# Patient Record
Sex: Female | Born: 1945
Health system: Southern US, Community
[De-identification: ages and names within clinical notes are randomized; demographics above are authoritative.]

## PROBLEM LIST (undated history)

## (undated) DIAGNOSIS — R51 Headache: Secondary | ICD-10-CM

## (undated) DIAGNOSIS — J45909 Unspecified asthma, uncomplicated: Secondary | ICD-10-CM

## (undated) DIAGNOSIS — Z889 Allergy status to unspecified drugs, medicaments and biological substances status: Secondary | ICD-10-CM

## (undated) DIAGNOSIS — R519 Headache, unspecified: Secondary | ICD-10-CM

## (undated) DIAGNOSIS — R569 Unspecified convulsions: Secondary | ICD-10-CM

## (undated) DIAGNOSIS — H9191 Unspecified hearing loss, right ear: Secondary | ICD-10-CM

## (undated) DIAGNOSIS — Z8673 Personal history of transient ischemic attack (TIA), and cerebral infarction without residual deficits: Secondary | ICD-10-CM

## (undated) DIAGNOSIS — G8254 Quadriplegia, C5-C7 incomplete: Secondary | ICD-10-CM

## (undated) DIAGNOSIS — J9621 Acute and chronic respiratory failure with hypoxia: Secondary | ICD-10-CM

## (undated) DIAGNOSIS — M419 Scoliosis, unspecified: Secondary | ICD-10-CM

## (undated) DIAGNOSIS — E039 Hypothyroidism, unspecified: Secondary | ICD-10-CM

## (undated) DIAGNOSIS — F32A Depression, unspecified: Secondary | ICD-10-CM

## (undated) DIAGNOSIS — I639 Cerebral infarction, unspecified: Secondary | ICD-10-CM

## (undated) DIAGNOSIS — F329 Major depressive disorder, single episode, unspecified: Secondary | ICD-10-CM

## (undated) HISTORY — PX: COLONOSCOPY: SHX174

## (undated) HISTORY — PX: OTHER SURGICAL HISTORY: SHX169

## (undated) HISTORY — PX: UPPER GI ENDOSCOPY: SHX6162

## (undated) HISTORY — PX: BACK SURGERY: SHX140

## (undated) HISTORY — PX: DILATION AND CURETTAGE OF UTERUS: SHX78

## (undated) SURGERY — Surgical Case
Anesthesia: *Unknown

---

## 1979-04-11 HISTORY — PX: TUBAL LIGATION: SHX77

## 2001-10-17 ENCOUNTER — Other Ambulatory Visit: Admission: RE | Admit: 2001-10-17 | Discharge: 2001-10-17 | Payer: Self-pay | Admitting: *Deleted

## 2002-05-29 ENCOUNTER — Encounter (INDEPENDENT_AMBULATORY_CARE_PROVIDER_SITE_OTHER): Payer: Self-pay | Admitting: Specialist

## 2002-05-29 ENCOUNTER — Ambulatory Visit (HOSPITAL_COMMUNITY): Admission: RE | Admit: 2002-05-29 | Discharge: 2002-05-29 | Payer: Self-pay | Admitting: *Deleted

## 2002-08-07 ENCOUNTER — Other Ambulatory Visit: Admission: RE | Admit: 2002-08-07 | Discharge: 2002-08-07 | Payer: Self-pay | Admitting: Obstetrics and Gynecology

## 2002-09-01 ENCOUNTER — Ambulatory Visit (HOSPITAL_COMMUNITY): Admission: RE | Admit: 2002-09-01 | Discharge: 2002-09-01 | Payer: Self-pay | Admitting: Obstetrics and Gynecology

## 2002-09-01 ENCOUNTER — Encounter: Payer: Self-pay | Admitting: Obstetrics and Gynecology

## 2003-08-14 ENCOUNTER — Other Ambulatory Visit: Admission: RE | Admit: 2003-08-14 | Discharge: 2003-08-14 | Payer: Self-pay | Admitting: Obstetrics and Gynecology

## 2003-09-25 ENCOUNTER — Ambulatory Visit (HOSPITAL_COMMUNITY): Admission: RE | Admit: 2003-09-25 | Discharge: 2003-09-25 | Payer: Self-pay | Admitting: Internal Medicine

## 2003-12-08 ENCOUNTER — Ambulatory Visit (HOSPITAL_COMMUNITY): Admission: RE | Admit: 2003-12-08 | Discharge: 2003-12-08 | Payer: Self-pay | Admitting: Internal Medicine

## 2004-02-22 ENCOUNTER — Ambulatory Visit: Payer: Self-pay | Admitting: Internal Medicine

## 2004-04-10 HISTORY — PX: OTHER SURGICAL HISTORY: SHX169

## 2004-06-24 ENCOUNTER — Ambulatory Visit: Payer: Self-pay | Admitting: Internal Medicine

## 2004-10-07 ENCOUNTER — Other Ambulatory Visit: Admission: RE | Admit: 2004-10-07 | Discharge: 2004-10-07 | Payer: Self-pay | Admitting: Obstetrics and Gynecology

## 2004-10-07 ENCOUNTER — Ambulatory Visit: Payer: Self-pay | Admitting: Internal Medicine

## 2004-10-19 ENCOUNTER — Encounter: Admission: RE | Admit: 2004-10-19 | Discharge: 2004-10-19 | Payer: Self-pay | Admitting: Internal Medicine

## 2004-10-21 ENCOUNTER — Encounter: Admission: RE | Admit: 2004-10-21 | Discharge: 2004-10-21 | Payer: Self-pay | Admitting: Internal Medicine

## 2005-01-11 ENCOUNTER — Encounter: Admission: RE | Admit: 2005-01-11 | Discharge: 2005-01-11 | Payer: Self-pay | Admitting: Internal Medicine

## 2005-01-23 ENCOUNTER — Encounter: Admission: RE | Admit: 2005-01-23 | Discharge: 2005-01-23 | Payer: Self-pay | Admitting: Orthopedic Surgery

## 2005-01-24 ENCOUNTER — Ambulatory Visit (HOSPITAL_COMMUNITY): Admission: RE | Admit: 2005-01-24 | Discharge: 2005-01-24 | Payer: Self-pay | Admitting: Orthopedic Surgery

## 2005-01-24 ENCOUNTER — Ambulatory Visit (HOSPITAL_BASED_OUTPATIENT_CLINIC_OR_DEPARTMENT_OTHER): Admission: RE | Admit: 2005-01-24 | Discharge: 2005-01-24 | Payer: Self-pay | Admitting: Orthopedic Surgery

## 2005-03-15 ENCOUNTER — Encounter: Admission: RE | Admit: 2005-03-15 | Discharge: 2005-03-15 | Payer: Self-pay | Admitting: Internal Medicine

## 2005-08-09 ENCOUNTER — Ambulatory Visit: Payer: Self-pay | Admitting: Internal Medicine

## 2005-09-29 ENCOUNTER — Ambulatory Visit: Payer: Self-pay | Admitting: Internal Medicine

## 2005-10-19 ENCOUNTER — Ambulatory Visit: Payer: Self-pay | Admitting: Internal Medicine

## 2006-03-06 ENCOUNTER — Ambulatory Visit: Payer: Self-pay | Admitting: Internal Medicine

## 2006-03-19 ENCOUNTER — Ambulatory Visit: Payer: Self-pay | Admitting: Internal Medicine

## 2006-05-10 ENCOUNTER — Ambulatory Visit: Payer: Self-pay | Admitting: Internal Medicine

## 2006-09-25 ENCOUNTER — Ambulatory Visit: Payer: Self-pay | Admitting: Internal Medicine

## 2007-02-28 DIAGNOSIS — J986 Disorders of diaphragm: Secondary | ICD-10-CM

## 2007-02-28 DIAGNOSIS — J449 Chronic obstructive pulmonary disease, unspecified: Secondary | ICD-10-CM

## 2007-02-28 DIAGNOSIS — E039 Hypothyroidism, unspecified: Secondary | ICD-10-CM | POA: Insufficient documentation

## 2007-02-28 DIAGNOSIS — M4120 Other idiopathic scoliosis, site unspecified: Secondary | ICD-10-CM | POA: Insufficient documentation

## 2007-02-28 DIAGNOSIS — K2990 Gastroduodenitis, unspecified, without bleeding: Secondary | ICD-10-CM

## 2007-02-28 DIAGNOSIS — J4489 Other specified chronic obstructive pulmonary disease: Secondary | ICD-10-CM | POA: Insufficient documentation

## 2007-02-28 DIAGNOSIS — J45909 Unspecified asthma, uncomplicated: Secondary | ICD-10-CM | POA: Insufficient documentation

## 2007-02-28 DIAGNOSIS — K2970 Gastritis, unspecified, without bleeding: Secondary | ICD-10-CM | POA: Insufficient documentation

## 2007-03-28 ENCOUNTER — Ambulatory Visit: Payer: Self-pay | Admitting: Internal Medicine

## 2007-10-30 ENCOUNTER — Ambulatory Visit: Payer: Self-pay | Admitting: Internal Medicine

## 2010-04-30 ENCOUNTER — Encounter: Payer: Self-pay | Admitting: Internal Medicine

## 2010-08-23 NOTE — Assessment & Plan Note (Signed)
Lindsay Mills                             PULMONARY OFFICE NOTE   NAME:Lindsay Mills                   MRN:          [id number]  DATE:09/25/2006                            DOB:          July 17, 1945    HISTORY:  This is a 65 year old white female former smoker, with a  diagnosis of COPD plus paralyzed left hemidiaphragm, (presumably due to  injury at time of back surgery for scolosis). On her last visit she  reported she was doing well with no need for Combivent. I emphasized  that the Combivent was a p.r.n. as was Mucinex DM. In the meantime for  some reason she got the idea she should stop Combivent and start  albuterol, and has been averaging about every other day use, especially  when she needs to go out to exercise. However, she says that when she  takes the albuterol she has excellent activity tolerance.   On physical examination, she is a somber but not overtly depressed white  female who becomes defensive and angry regarding medication issues (for  reasons that are not clear since we went over all this in writing in the  form of a medication calendar which she has failed to keep up with as  requested to assure quality of care (these were instructions at the  top of the sheet).  HEENT: Unremarkable, pharynx clear.  LUNG FIELDS: Reveal a few rhonchi only on expiration with diminished  breath sounds to the left base.  There is a regular rate and rhythm without murmur, rub, gallop.  No  increase in P2.  ABDOMEN: Soft, benign.  EXTREMITIES: Warm without calf tenderness, cyanosis, clubbing, or edema.   PFTs performed today indicate that she has lost ground with an FEV 1 and  FVC significantly lower than a year ago.   IMPRESSION:  Chronic obstructive pulmonary disease with an apparent  asthmatic component suggested by the frequent need for albuterol (albeit  not nocturnally) and the reduction in FEV 1 noted on today's evaluation  as compared  to a year ago. I recommended she stop Singulair and start  Symbicort 80/4.5 two puffs b.i.d. and produced her a new calendar today  with the above recommendations. When I showed her the new calendar she  made the comment that  I will go home and get this on my computer right  away without understanding really the intent of the calendar, that is  to stay on the same page, using the same organization for the next 3  months, and then to give [place] feedback as to whether the medications work  both maintenance and p.r.n., and to hold [place] each accountable for the  recommendations that are made in this office (note that she made several  significant errors in terms of understanding the instructions  previously). Indicating that we were not all reading from the same  page which is exactly the intension of creating the calendar in the  first place.   I am not sure I can continue to work with this patient based on the  anger she displayed today, but I apologized for  the system we use  where we have 1 set of instructions and she understands a different 1  when she goes home, and the medication calendar is the best bridge  that I know to provide full medication reconciliation in patients like  Ms. Hund.   Also I think there is some reluctance to change courses because she  feels that she does well as long she uses albuterol, which risks both  tachyphylaxis (from the S-isomer component of albuterol) as well as  opening the airway without providing adequate antiinflammatory control  of what is probably smoldering asthma.   I therefore recommended the above changes and asked her to call me if  not improved after 2 weeks of Symbicort with the goal of improving  activity tolerance, and minimizing the need for rescue therapy (I  switched her from Combivent to albuterol at this point both verbally and  by eliminating Combivent from the p.r.n. list). So again to make sure we  were still reading from the  same page in terms of full medication  understanding and reconciliation, and I have asked her to show this to  Dr. Cottonwood Callas as well as any physician she sees, and explained why this was  so important.   Overall I spent over 30 minutes with this patient today just on the  issue of medication reconciliation but not sure still that she got the  full message I intended, nor the purpose.     Lindsay Mills. Lindsay Sires, MD, [facility]  Electronically Signed    MBW/MedQ  DD: 09/25/2006  DT: 09/26/2006  Job #: [id number]   cc:   Lindsay Mills, M.D.

## 2010-08-26 NOTE — Assessment & Plan Note (Signed)
[facility]                             PULMONARY OFFICE NOTE   NAME:Speaker, Lindsay Mills                   MRN:          [id number]  DATE:03/06/2006                            DOB:          Feb 01, 1946    PULMONARY/EXTENDED FOLLOWUP OFFICE VISIT   Ms. Lindsay Mills was last seen on July 12 with COPD with no active asthmatic  component, with cough only after eating suggesting reflux to me.  I  recommended she treat reflux aggressively preoperatively and underwent  extensive back surgery both on September 5 and January 10, 2006, in  [place] and was found to have a paralyzed left hemidiaphragm  postoperatively by fluoroscopy of unclear origin (her baseline chest x-  ray shows so much curvature that it was not possible to say whether the  diaphragm was elevated at baseline).   In any case, she has done well postoperatively but is now maintained on  high doses of lisinopril and is having to use the Combivent four times  daily whereas preoperatively she was not using any Combivent at all.  She denies, however, any nocturnal awaking, excess sputum production,  fever, chills, sweats, chest pain, or leg swelling.   For her medications please see medication face sheet, dated March 06, 2006.   PHYSICAL EXAMINATION:  GENERAL:  She is an anxious but pleasant-  appearing, slow-moving white female in no acute distress, still wearing  extensive back brace.  VITAL SIGNS:  She is afebrile with stable vital signs with a weight of  128 pounds which is down 8 pounds from previous visit.  HEENT:  Unremarkable, pharynx clear.  LUNG FIELDS:  Reveal diminished breath sound but there is no wheezing at  all within 4 hours of her last Combivent treatment.  HEART:  Has a regular rhythm without murmur, gallop, rub.  ABDOMEN:  Soft, benign.  EXTREMITIES:  Warm without calf tenderness, cyanosis, clubbing, edema.   PFTs were reviewed from June 22 and indicated an FEV1 of only 43%  predicted with no improvement after bronchodilators.   IMPRESSION:  Chronic obstructive pulmonary disease with no active  asthmatic component by previous study, now dependent on Combivent but  note the absence of significant sputum production or nocturnal awakening  again suggests the upper airway as the possible culprit here, especially  in a patient who has undergone both intubation twice and now is  maintained on ACE inhibitors.  I therefore recommended she stop the ACE  inhibitors in favor of Benicar at 20 mg one daily with the option of  taking a second pill if her diastolics are above 90 or systolics above  140.   I reviewed with her the optimal use for Combivent, which is a p.r.n.  q.4h. and not a q.i.d. drug, and also to try substituting ibuprofen  (which she tolerated in the past) for Dilaudid.   I spent actually 15-20 minutes of a 25-minute visit with this patient  trying to reiterate these instructions in writing.  She is having a  difficult time grasping the concept of a maintenance versus p.r.n. and  so I would like her to  return in 2 weeks both for a blood pressure  recheck and also medication reconciliation and generation of a  medication calendar.     Lindsay Mills. Sherene Sires, MD, [facility]  Electronically Signed    MBW/MedQ  DD: 03/06/2006  DT: 03/06/2006  Job #: [id number]   cc:   Georgianne Fick, M.D.

## 2010-08-26 NOTE — Op Note (Signed)
NAMEJAHZARIA, STITZER                      ACCOUNT NO.:  [id number]   MEDICAL RECORD NO.:  [id number]                   PATIENT TYPE:  AMB   LOCATION:  ENDO                                 FACILITY:  [facility]   PHYSICIAN:  Georgiana Spinner, M.D.                 DATE OF BIRTH:  06/29/1945   DATE OF PROCEDURE:  05/29/2002  DATE OF DISCHARGE:                                 OPERATIVE REPORT   PROCEDURE:  Colonoscopy.   INDICATIONS FOR PROCEDURE:  Hemoccult positivity.   ANESTHESIA:  Demerol 40, Versed 2 mg.   DESCRIPTION OF PROCEDURE:  With the patient mildly sedated in the left  lateral decubitus position, the Olympus videoscopic colonoscope was inserted  in the rectum and passed under direct vision to the cecum, identified by the  ileocecal valve and the crow's foot of the cecum. We entered into the  terminal ileum which also appeared normal and was photographed.  From this  point, the colonoscope was slowly withdrawn taking circumferential views of  the entire colonic mucosa, stopping only in the rectum which appeared normal  on direct and showed large hemorrhoid on retroflexed view. The endoscope was  straightened and withdrawn. The patient's vital signs and pulse oximetry  remained stable. The patient tolerated the procedure well without apparent  complications.   FINDINGS:  Internal hemorrhoid, otherwise unremarkable examination.   PLAN:  See endoscopy note for further details.                                               Georgiana Spinner, M.D.    GMO/MEDQ  D:  05/29/2002  T:  05/29/2002  Job:  [id number]

## 2010-08-26 NOTE — Op Note (Signed)
NAMEYUDITH, COFFELL            ACCOUNT NO.:  [id number]   MEDICAL RECORD NO.:  [id number]          PATIENT TYPE:  AMB   LOCATION:  DSC                          FACILITY:  [facility]   PHYSICIAN:  Leonides Grills, M.D.     DATE OF BIRTH:  21-Mar-1946   DATE OF PROCEDURE:  01/24/2005  DATE OF DISCHARGE:                                 OPERATIVE REPORT   PREOPERATIVE DIAGNOSES:  1.  Left hallux valgus.  2.  Left bunionette.   POSTOPERATIVE DIAGNOSES:  1.  Left hallux valgus.  2.  Left bunionette.   OPERATIONS:  1.  Left chevron bunionectomy.  2.  Left partial excision of fifth metatarsal head.  3.  Stress x-rays, left foot.   ANESTHESIA:  General.   SURGEON:  Leonides Grills, M.D.   ASSISTANT:  Lianne Cure, P.A.   ESTIMATED BLOOD LOSS:  Minimal.   TOURNIQUET TIME:  Approximately 40 minutes.   COMPLICATIONS:  None.   DISPOSITION:  Stable to the PAR.   INDICATIONS:  This is a 65 year old female with longstanding forefoot pain  that was interfering with her life to the point that she could not do what  she wanted to do.  She was consented for the above procedure.  All risks,  which include infection, nerve or vessel injury, nonunion, malunion,  hardware irritation, hardware failure, hallux varus, recurrence of  deformity, persistent pain, worsening of pain, prolonged recovery,  stiffness, arthritis, were all explained, and questions were encouraged and  answered.   OPERATION:  The patient was brought to the operating room and placed in the  supine position after adequate general endotracheal tube anesthesia  administered as well as Ancef 1 g IV piggyback.  The left lower extremity  was then prepped and draped in the usual sterile manner over a proximally-  placed thigh tourniquet.  The limb was gravity-exsanguinated the tourniquet  elevated to 290 mmHg.  A dorsal lateral incision was then made over the left  fifth metatarsal head.  The incision was carried down through  skin,  hemostasis obtained.  The capsule was opened in line with the incision.  The  soft tissue was then elevated over the lateral aspect of the head.  A  sagittal saw was then used to create an osteotomy and resection of the  lateral aspect of the fifth metatarsal head.  The edges were then rounded  off with a rongeur.  This was palpated through skin to be adequately  decompressed.  The area was copiously irrigated with normal saline and we  then made a longitudinal incision over the medial aspect of the great toe  MTP joint.  Dissection was carried down through skin and hemostasis was  obtained.  The neurovascular structures were identified both superiorly and  inferiorly and protected throughout the case.  Lateral capsulotomy was then  made.  A simple bunionectomy was then performed with a sagittal saw.  The  lateral capsule was then released with the curved Beaver blade.  Rocky Link  Johnson's ridge was then rounded off with a rongeur.  Soft tissue was  elevated both superiorly and inferiorly and  protected throughout the case.  The center of the head was then identified and a chevron osteotomy was then  created with a sagittal saw.  The head was then translated approximately 3-4  mm laterally and fixed with a 2.0 mm fully-threaded cortical set screw using  a 1.5 mm drill hole, respectively.  This was countersunk.  Trimmed off some  redundant bone medially with the sagittal saw.  The area and joint were  copiously irrigated with normal saline.  The capsule was advanced both  superiorly and proximally and reconstructed with a 2-0 Vicryl suture.  Stress x-rays were obtained in the AP and lateral planes and showed no gross  motion across the osteotomy site.  Fixation was in the proper position and  the correction was excellent as well.  The range of motion of the toe was  excellent and the sesamoids were located.  Once again, the tourniquet was  deflated, hemostasis was obtained.  The skin was  closed with 4-0 nylon  suture over all wounds.  A sterile dressing was applied, a Programmer, multimedia  dressing was applied, a hard-sole shoe was applied.  The patient was stable  to the PAR.      Leonides Grills, M.D.  Electronically Signed     PB/MEDQ  D:  01/24/2005  T:  01/24/2005  Job:  [id number]

## 2010-08-26 NOTE — Assessment & Plan Note (Signed)
[facility]                               PULMONARY OFFICE NOTE   NAME:Elgersma, CHAKAYLA SASNETT                   MRN:          [id number]  DATE:10/19/2005                            DOB:          October 14, 1945    HISTORY:  A 65 year old white female remote smoker with evidence of severe  COPD by PFTs as recently as September 29, 2005 in for preoperative evaluation  to consider spine surgery.  She states she is having worsening troubles over  the last several months, especially immediately after eating or if she tries  to exercise.  She has no real variability with environmental, weather, or  season changes.  Denies any associated subjective wheezing, fevers, chills,  sweats, orthopnea, PND, sinus, or reflux symptoms, or significant coughing.  She is considering undergoing extensive spinal surgery in September in  [place] to correct this.   Presently, she is maintained on Singulair 10 mg daily, Nexium 40 mg q.a.m.,  Flonase b.i.d., but rarely uses albuterol.  The only time she uses albuterol  is right after she eats.  She denies ever needing it at night or waking up  with any nocturnal wheeze or cough.  She says she is not consistent about  using the Nexium every day, but pretty much remembers it most of the time.  Denies any overt reflux symptoms.   PHYSICAL EXAMINATION:  GENERAL:  She is a pleasant, ambulatory white female  in no acute distress.  VITAL SIGNS:  She has normal vital signs.  HEENT:  Unremarkable.  Pharynx clear.  Frequent throat clearing, but no  evidence of excessive postnasal drainage or cobblestoning.  LUNGS:  Lung fields reveal diminished breath sounds bilaterally.  No  wheezing.  HEART:  There is regular rhythm without murmurs, rubs, or gallops.  ABDOMEN:  Obese, but otherwise benign.  EXTREMITIES:  Warm without calf tenderness, clubbing, cyanosis, edema.   PFTs indicate an FEV1 of 1 L which is down from 1.33 in June of 2006.  Looking  back in June of 2006 it does not appear that she was on any  different medicines that she is on now with the exception of daily Nexium  versus b.i.d. dosing.  This plus the fact that her symptoms are much worse  after she eats suggest to me a possible mechanism of reflux and also may be  developing significant deconditioning.   IMPRESSION:  Chronic obstructive pulmonary disease with no evidence of an  active asthmatic component.  Note, she does not wake up nocturnally.  Denies  any excessive coughing and most of her symptoms occur after eating.  This  would indicate to me that she is experiencing problems related to  gastroesophageal reflux disease and/or the effect of reduced abdominal  compliance which is in turn related to worsening thoracic type of scoliosis.   RECOMMENDATIONS:  Increase PPI therapy to b.i.d. with the option of adding  back empiric bronchodilators prior to surgery if not improving prior to that  time.  In the meantime I have given her an exercise regimen and emphasized  to her that if she is going  to have major surgery in [place] she probably  needs to check in with her [facility] pulmonologist well before the actual  operation to see if more short-term more aggressive bronchodilator regimen  might be recommended.                                   [name]. Sherene Sires, MD, [facility]   MBW/MedQ  DD:  10/20/2005  DT:  10/20/2005  Job #:  [id number]   cc:   Lucianne Muss, M.D.

## 2010-08-26 NOTE — Assessment & Plan Note (Signed)
[facility]                             PULMONARY OFFICE NOTE   NAME:Lindsay Mills, Lindsay Mills                   MRN:          [id number]  DATE:05/10/2006                            DOB:          1946/03/19    HISTORY:  A delightful 65 year old white female with severe COPD and a  paralyzed left hemidiaphragm, with an FEV1 of a liter documented in June  2007, who actually has no significant limitations presently and rarely  needs Combivent any more.  She also sleeps well at night with no  significant cough, fevers, chills, sweats, morning congestion or excess  sputum production.  She denies any chest pain, orthopnea, PND or leg  swelling.   She is now using the medication calendar that our nurse practitioner  generated for her and has been no longer needing any form of p.r.n.,  including Combivent and Dilaudid.   PHYSICAL EXAMINATION:  GENERAL:  She is a pleasant, ambulatory white  female in no acute distress.  VITAL SIGNS:  Stable vital signs with a blood pressure of 118/70 after  taking Benicar 40 mg this morning.  HEENT:  Unremarkable.  NECK:  Clear.  LUNGS:  Lung fields revealed diminished breath sounds bilaterally but no  wheezing.  There is dullness at the left base.  CARDIAC:  There is a regular rate and rhythm without murmur, gallop, or  rub.  ABDOMEN:  Soft, benign.  EXTREMITIES:  Warm without calf tenderness, cyanosis or clubbing.   IMPRESSION:  Severe chronic obstructive pulmonary disease plus paralyzed  left hemidiaphragm, relatively well-compensated to the point where she  does not require Combivent any more.  She also no longer has any  significant evidence of bronchitis, and therefore I am going to  recommend Mucinex DM be switched to p.r.n.   The paralyzed hemidiaphragm was discovered in [place], so we have no data  on this but the workup was completed there and is also not really a  relevant issue since she is not presently limited  by dyspnea or  orthopnea.   Therefore, pulmonary follow-up should be with a full set of PFTs in June  and then yearly thereafter with all refills through  her primary  if ok  with Dr. Nicholos Johns.     [name]. Sherene Sires, MD, [facility]  Electronically Signed    MBW/MedQ  DD: 05/10/2006  DT: 05/10/2006  Job #: [id number]   cc:   Georgianne Fick, M.D.

## 2010-08-26 NOTE — Op Note (Signed)
NAMESADIELYNN, ONION                      ACCOUNT NO.:  [id number]   MEDICAL RECORD NO.:  [id number]                   PATIENT TYPE:  AMB   LOCATION:  ENDO                                 FACILITY:  [facility]   PHYSICIAN:  Georgiana Spinner, M.D.                 DATE OF BIRTH:  1945/10/25   DATE OF PROCEDURE:  05/29/2002  DATE OF DISCHARGE:                                 OPERATIVE REPORT   PROCEDURE:  Upper endoscopy.   INDICATIONS:  Hemoccult positivity.   ANESTHESIA:  Demerol 40, Versed 6 mg.   PROCEDURE IN DETAIL:  With patient mildly sedated in the left lateral  decubitus position, the Olympus video endoscope was inserted in the mouth,  passed under direct vision through the esophagus, and there was a question  of Barrett's esophagus seen and photographed and biopsied.  We entered into  the stomach.  The fundus, body and antrum were visualized; and where the  fundus meets the body, there were two linear ulcerations seen, photographed  and biopsied.  The duodenal bulb was visualized, as was the second portion  of the duodenum.  From this point, the endoscope was slowly withdrawn,  taking circumferential views of the duodenal mucosa until the endoscope was  pulled back into the stomach.  It was placed in retroflexion to view the  stomach from below.  The endoscope was then straightened and withdrawn,  taking circumferential views of the entire gastric and esophageal mucosa.  The patient's vital signs and pulse oximeter remained stable.  The patient  tolerated the procedure well without apparent complications.   FINDINGS:  Question of Barrett's esophagus above the hiatal hernia  photographed and biopsied.  Two linear ulcers biopsied.  Await biopsy  report.  The patient will call me for results and follow up with me as an  outpatient.                                               Georgiana Spinner, M.D.    GMO/MEDQ  D:  05/29/2002  T:  05/29/2002  Job:  [id number]

## 2011-02-27 ENCOUNTER — Other Ambulatory Visit: Payer: Self-pay | Admitting: Obstetrics and Gynecology

## 2012-04-10 HISTORY — PX: OTHER SURGICAL HISTORY: SHX169

## 2012-07-08 ENCOUNTER — Other Ambulatory Visit: Payer: Self-pay | Admitting: Internal Medicine

## 2012-07-08 DIAGNOSIS — R55 Syncope and collapse: Secondary | ICD-10-CM

## 2012-07-11 ENCOUNTER — Ambulatory Visit
Admission: RE | Admit: 2012-07-11 | Discharge: 2012-07-11 | Disposition: A | Discharge status: Home / Self Care | Payer: Medicare Other | Source: Ambulatory Visit | Attending: Internal Medicine | Admitting: Internal Medicine

## 2012-07-11 DIAGNOSIS — R55 Syncope and collapse: Secondary | ICD-10-CM

## 2013-08-08 DIAGNOSIS — I639 Cerebral infarction, unspecified: Secondary | ICD-10-CM

## 2013-08-08 HISTORY — DX: Cerebral infarction, unspecified: I63.9

## 2014-05-29 ENCOUNTER — Other Ambulatory Visit: Payer: Self-pay | Admitting: Obstetrics and Gynecology

## 2014-08-07 ENCOUNTER — Ambulatory Visit (HOSPITAL_BASED_OUTPATIENT_CLINIC_OR_DEPARTMENT_OTHER)
Admission: RE | Admit: 2014-08-07 | Payer: Medicare Other | Source: Ambulatory Visit | Admitting: Obstetrics and Gynecology

## 2014-08-07 ENCOUNTER — Encounter (HOSPITAL_BASED_OUTPATIENT_CLINIC_OR_DEPARTMENT_OTHER): Admission: RE | Payer: Self-pay | Source: Ambulatory Visit

## 2014-08-07 SURGERY — DILATATION AND CURETTAGE /HYSTEROSCOPY
Anesthesia: Choice

## 2014-10-07 ENCOUNTER — Other Ambulatory Visit: Payer: Self-pay | Admitting: Obstetrics and Gynecology

## 2014-10-13 ENCOUNTER — Encounter (HOSPITAL_COMMUNITY): Payer: Self-pay

## 2014-10-13 ENCOUNTER — Other Ambulatory Visit: Payer: Self-pay

## 2014-10-13 ENCOUNTER — Encounter (HOSPITAL_COMMUNITY)
Admission: RE | Admit: 2014-10-13 | Discharge: 2014-10-13 | Disposition: A | Discharge status: Home / Self Care | Payer: Medicare Other | Source: Ambulatory Visit | Attending: Obstetrics and Gynecology | Admitting: Obstetrics and Gynecology

## 2014-10-13 DIAGNOSIS — Z01818 Encounter for other preprocedural examination: Secondary | ICD-10-CM | POA: Insufficient documentation

## 2014-10-13 DIAGNOSIS — N938 Other specified abnormal uterine and vaginal bleeding: Secondary | ICD-10-CM | POA: Diagnosis not present

## 2014-10-13 HISTORY — DX: Major depressive disorder, single episode, unspecified: F32.9

## 2014-10-13 HISTORY — DX: Allergy status to unspecified drugs, medicaments and biological substances: Z88.9

## 2014-10-13 HISTORY — DX: Unspecified asthma, uncomplicated: J45.909

## 2014-10-13 HISTORY — DX: Cerebral infarction, unspecified: I63.9

## 2014-10-13 HISTORY — DX: Unspecified hearing loss, right ear: H91.91

## 2014-10-13 HISTORY — DX: Headache, unspecified: R51.9

## 2014-10-13 HISTORY — DX: Hypothyroidism, unspecified: E03.9

## 2014-10-13 HISTORY — DX: Depression, unspecified: F32.A

## 2014-10-13 HISTORY — DX: Unspecified convulsions: R56.9

## 2014-10-13 HISTORY — DX: Scoliosis, unspecified: M41.9

## 2014-10-13 HISTORY — DX: Headache: R51

## 2014-10-13 LAB — BASIC METABOLIC PANEL
Anion gap: 4 — ABNORMAL LOW (ref 5–15)
BUN: 17 mg/dL (ref 6–20)
CALCIUM: 9.5 mg/dL (ref 8.9–10.3)
CO2: 33 mmol/L — ABNORMAL HIGH (ref 22–32)
Chloride: 102 mmol/L (ref 101–111)
Creatinine, Ser: 1.05 mg/dL — ABNORMAL HIGH (ref 0.44–1.00)
GFR, EST NON AFRICAN AMERICAN: 53 mL/min — AB (ref >60)
Glucose, Bld: 95 mg/dL (ref 65–99)
Potassium: 3.1 mmol/L — ABNORMAL LOW (ref 3.5–5.1)
Sodium: 139 mmol/L (ref 135–145)

## 2014-10-13 LAB — CBC
HCT: 41.7 % (ref 36.0–46.0)
Hemoglobin: 13.7 g/dL (ref 12.0–15.0)
MCH: 31.8 pg (ref 26.0–34.0)
MCHC: 32.9 g/dL (ref 30.0–36.0)
MCV: 96.8 fL (ref 78.0–100.0)
PLATELETS: 111 10*3/uL — AB (ref 150–400)
RBC: 4.31 MIL/uL (ref 3.87–5.11)
RDW: 14.6 % (ref 11.5–15.5)
WBC: 5.1 10*3/uL (ref 4.0–10.5)

## 2014-10-13 NOTE — Pre-Procedure Instructions (Signed)
Patient instructed to eat bananas now until DOS.

## 2014-10-13 NOTE — Patient Instructions (Addendum)
   Your procedure is scheduled on:  Friday, July 15  Enter through the [employer] [facility] at: 10:45 AM Pick up the phone at the desk and dial [phone] and inform us of your arrival.  Please call this number if you have any problems the morning of surgery: [phone]  Remember: Do not eat food after midnight: Thursday Do not drink clear liquids after: 8 AM Friday, day of surgery Take these medicines the morning of surgery with a SIP OF WATER: use symbicort inhaler, primidone, singulair, synthroid, zoloft, flonase if needed.  Bring albuterol inhaler with you on day of surgery.  Do not wear jewelry, make-up, or FINGER nail polish No metal in your hair or on your body. Do not wear lotions, powders, perfumes.  You may wear deodorant.  Do not bring valuables to the hospital. Contacts, dentures or bridgework may not be worn into surgery.   Patients discharged on the day of surgery will not be allowed to drive home.  Home with husband Ron cell [phone]

## 2014-10-13 NOTE — Pre-Procedure Instructions (Signed)
Dr Tresa Moore informed of patient's medical history, medications and low platelet count 111 at PAT appt.  Repeat platelet count on DOS. Verified T/O.

## 2014-10-22 ENCOUNTER — Encounter (HOSPITAL_COMMUNITY): Payer: Self-pay | Admitting: Anesthesiology

## 2014-10-22 NOTE — Anesthesia Preprocedure Evaluation (Addendum)
Anesthesia Evaluation  Patient identified by MRN, date of birth, ID band Patient awake    Reviewed: Allergy & Precautions, H&P , NPO status , Patient's Chart, lab work & pertinent test results, reviewed documented beta blocker date and time , Unable to perform ROS - Chart review only  History of Anesthesia Complications (+) PONV and history of anesthetic complications  Airway Mallampati: II  TM Distance: >3 FB Neck ROM: full    Dental no notable dental hx.    Pulmonary asthma , COPD COPD inhaler, former smoker,  breath sounds clear to auscultation  Pulmonary exam normal       Cardiovascular negative cardio ROS Normal cardiovascular examRhythm:regular Rate:Normal     Neuro/Psych  Headaches, Seizures -, Well Controlled,  PSYCHIATRIC DISORDERS CVA, Residual Symptoms    GI/Hepatic negative GI ROS, Neg liver ROS,   Endo/Other  Hypothyroidism   Renal/GU negative Renal ROS     Musculoskeletal Significant scoliosis   Abdominal   Peds  Hematology negative hematology ROS (+)   Anesthesia Other Findings NPO appropriate, allergies reviewed Denies active cardiac or pulmonary symptoms, METS > 4 No recent congestive cough or symptoms of upper respiratory infection   Reproductive/Obstetrics negative OB ROS                          Anesthesia Physical Anesthesia Plan  ASA: III  Anesthesia Plan: General   Post-op Pain Management:    Induction: Intravenous  Airway Management Planned: LMA  Additional Equipment:   Intra-op Plan:   Post-operative Plan: Extubation in OR  Informed Consent: I have reviewed the patients History and Physical, chart, labs and discussed the procedure including the risks, benefits and alternatives for the proposed anesthesia with the patient or authorized representative who has indicated his/her understanding and acceptance.   Dental Advisory Given  Plan Discussed with:  Anesthesiologist  Anesthesia Plan Comments:        Anesthesia Quick Evaluation

## 2014-10-23 ENCOUNTER — Observation Stay (HOSPITAL_COMMUNITY)
Admission: RE | Admit: 2014-10-23 | Discharge: 2014-10-24 | Disposition: A | Discharge status: Home / Self Care | Payer: Medicare Other | Source: Ambulatory Visit | Attending: Obstetrics and Gynecology | Admitting: Obstetrics and Gynecology

## 2014-10-23 ENCOUNTER — Encounter (HOSPITAL_COMMUNITY)
Admission: RE | Disposition: A | Discharge status: Home / Self Care | Payer: Self-pay | Source: Ambulatory Visit | Attending: Obstetrics and Gynecology

## 2014-10-23 ENCOUNTER — Ambulatory Visit (HOSPITAL_COMMUNITY): Payer: Medicare Other | Admitting: Anesthesiology

## 2014-10-23 DIAGNOSIS — N858 Other specified noninflammatory disorders of uterus: Secondary | ICD-10-CM | POA: Diagnosis present

## 2014-10-23 DIAGNOSIS — F329 Major depressive disorder, single episode, unspecified: Secondary | ICD-10-CM | POA: Diagnosis not present

## 2014-10-23 DIAGNOSIS — Z885 Allergy status to narcotic agent status: Secondary | ICD-10-CM | POA: Diagnosis not present

## 2014-10-23 DIAGNOSIS — J45909 Unspecified asthma, uncomplicated: Secondary | ICD-10-CM | POA: Diagnosis not present

## 2014-10-23 DIAGNOSIS — I480 Paroxysmal atrial fibrillation: Secondary | ICD-10-CM

## 2014-10-23 DIAGNOSIS — N950 Postmenopausal bleeding: Secondary | ICD-10-CM | POA: Insufficient documentation

## 2014-10-23 DIAGNOSIS — Z8673 Personal history of transient ischemic attack (TIA), and cerebral infarction without residual deficits: Secondary | ICD-10-CM | POA: Diagnosis not present

## 2014-10-23 DIAGNOSIS — Z87891 Personal history of nicotine dependence: Secondary | ICD-10-CM | POA: Diagnosis not present

## 2014-10-23 DIAGNOSIS — M419 Scoliosis, unspecified: Secondary | ICD-10-CM | POA: Insufficient documentation

## 2014-10-23 DIAGNOSIS — I4891 Unspecified atrial fibrillation: Secondary | ICD-10-CM | POA: Insufficient documentation

## 2014-10-23 DIAGNOSIS — G40909 Epilepsy, unspecified, not intractable, without status epilepticus: Secondary | ICD-10-CM | POA: Diagnosis not present

## 2014-10-23 DIAGNOSIS — E039 Hypothyroidism, unspecified: Secondary | ICD-10-CM | POA: Insufficient documentation

## 2014-10-23 DIAGNOSIS — R51 Headache: Secondary | ICD-10-CM | POA: Insufficient documentation

## 2014-10-23 DIAGNOSIS — I482 Chronic atrial fibrillation, unspecified: Secondary | ICD-10-CM | POA: Diagnosis present

## 2014-10-23 HISTORY — PX: HYSTEROSCOPY W/D&C: SHX1775

## 2014-10-23 LAB — COMPREHENSIVE METABOLIC PANEL
ALBUMIN: 3.5 g/dL (ref 3.5–5.0)
ALK PHOS: 60 U/L (ref 38–126)
ALT: 13 U/L — AB (ref 14–54)
ANION GAP: 5 (ref 5–15)
AST: 30 U/L (ref 15–41)
BUN: 15 mg/dL (ref 6–20)
CO2: 33 mmol/L — ABNORMAL HIGH (ref 22–32)
Calcium: 8.9 mg/dL (ref 8.9–10.3)
Chloride: 103 mmol/L (ref 101–111)
Creatinine, Ser: 0.82 mg/dL (ref 0.44–1.00)
GFR calc Af Amer: >60 mL/min (ref >60)
GFR calc non Af Amer: >60 mL/min (ref >60)
Glucose, Bld: 89 mg/dL (ref 65–99)
POTASSIUM: 3.8 mmol/L (ref 3.5–5.1)
Sodium: 141 mmol/L (ref 135–145)
Total Bilirubin: 0.5 mg/dL (ref 0.3–1.2)
Total Protein: 6.4 g/dL — ABNORMAL LOW (ref 6.5–8.1)

## 2014-10-23 LAB — CBC
HEMATOCRIT: 38.1 % (ref 36.0–46.0)
HEMOGLOBIN: 12.9 g/dL (ref 12.0–15.0)
MCH: 32.4 pg (ref 26.0–34.0)
MCHC: 33.9 g/dL (ref 30.0–36.0)
MCV: 95.7 fL (ref 78.0–100.0)
Platelets: 106 10*3/uL — ABNORMAL LOW (ref 150–400)
RBC: 3.98 MIL/uL (ref 3.87–5.11)
RDW: 14.8 % (ref 11.5–15.5)
WBC: 5.8 10*3/uL (ref 4.0–10.5)

## 2014-10-23 LAB — TROPONIN I

## 2014-10-23 LAB — PLATELET COUNT: Platelets: 114 10*3/uL — ABNORMAL LOW (ref 150–400)

## 2014-10-23 LAB — TSH: TSH: 0.372 u[IU]/mL (ref 0.350–4.500)

## 2014-10-23 LAB — T4, FREE: FREE T4: 0.86 ng/dL (ref 0.61–1.12)

## 2014-10-23 SURGERY — DILATATION AND CURETTAGE /HYSTEROSCOPY
Anesthesia: General

## 2014-10-23 MED ORDER — ONDANSETRON HCL 4 MG/2ML IJ SOLN
INTRAMUSCULAR | Status: AC
  Filled 2014-10-23: qty 2

## 2014-10-23 MED ORDER — ACETAMINOPHEN 325 MG PO TABS: 325.0000 mg | ORAL_TABLET | ORAL | Status: DC | PRN

## 2014-10-23 MED ORDER — PRIMIDONE 250 MG PO TABS
250.0000 mg | ORAL_TABLET | Freq: Two times a day (BID) | ORAL | Status: DC
  Administered 2014-10-23 – 2014-10-24 (×2): 250 mg via ORAL
  Filled 2014-10-23 (×2): qty 1

## 2014-10-23 MED ORDER — PRENATAL MULTIVITAMIN CH: 1.0000 | ORAL_TABLET | Freq: Every day | ORAL | Status: DC

## 2014-10-23 MED ORDER — LIDOCAINE HCL (CARDIAC) 20 MG/ML IV SOLN
INTRAVENOUS | Status: AC
  Filled 2014-10-23: qty 5

## 2014-10-23 MED ORDER — PROPOFOL 10 MG/ML IV BOLUS
INTRAVENOUS | Status: AC
  Filled 2014-10-23: qty 20

## 2014-10-23 MED ORDER — ACETAMINOPHEN 160 MG/5ML PO SOLN: 325.0000 mg | ORAL | Status: DC | PRN

## 2014-10-23 MED ORDER — LIDOCAINE HCL 1 % IJ SOLN
INTRAMUSCULAR | Status: AC
  Filled 2014-10-23: qty 20

## 2014-10-23 MED ORDER — MIDAZOLAM HCL 2 MG/2ML IJ SOLN
INTRAMUSCULAR | Status: DC | PRN
  Administered 2014-10-23: 0.5 mg via INTRAVENOUS

## 2014-10-23 MED ORDER — SERTRALINE HCL 100 MG PO TABS
100.0000 mg | ORAL_TABLET | Freq: Every day | ORAL | Status: DC
  Filled 2014-10-23: qty 1

## 2014-10-23 MED ORDER — MONTELUKAST SODIUM 10 MG PO TABS
10.0000 mg | ORAL_TABLET | Freq: Every morning | ORAL | Status: DC
  Administered 2014-10-24: 10 mg via ORAL
  Filled 2014-10-23: qty 1

## 2014-10-23 MED ORDER — ATORVASTATIN CALCIUM 40 MG PO TABS
40.0000 mg | ORAL_TABLET | Freq: Every day | ORAL | Status: DC
  Administered 2014-10-23: 40 mg via ORAL
  Filled 2014-10-23: qty 1

## 2014-10-23 MED ORDER — LEVOTHYROXINE SODIUM 100 MCG PO TABS
100.0000 ug | ORAL_TABLET | Freq: Every day | ORAL | Status: DC
  Administered 2014-10-24: 100 ug via ORAL
  Filled 2014-10-23: qty 1

## 2014-10-23 MED ORDER — FENTANYL CITRATE (PF) 250 MCG/5ML IJ SOLN
INTRAMUSCULAR | Status: DC | PRN
  Administered 2014-10-23: 25 ug via INTRAVENOUS

## 2014-10-23 MED ORDER — LIDOCAINE HCL (CARDIAC) 20 MG/ML IV SOLN
INTRAVENOUS | Status: DC | PRN
  Administered 2014-10-23: 50 mg via INTRAVENOUS

## 2014-10-23 MED ORDER — LIDOCAINE HCL 1 % IJ SOLN
INTRAMUSCULAR | Status: DC | PRN
  Administered 2014-10-23: 10 mL

## 2014-10-23 MED ORDER — PHENYLEPHRINE HCL 10 MG/ML IJ SOLN
INTRAMUSCULAR | Status: DC | PRN
  Administered 2014-10-23: 40 ug via INTRAVENOUS
  Administered 2014-10-23: 80 ug via INTRAVENOUS
  Administered 2014-10-23 (×4): 40 ug via INTRAVENOUS

## 2014-10-23 MED ORDER — DEXAMETHASONE SODIUM PHOSPHATE 4 MG/ML IJ SOLN: INTRAMUSCULAR | Status: DC | PRN

## 2014-10-23 MED ORDER — PHENYLEPHRINE 40 MCG/ML (10ML) SYRINGE FOR IV PUSH (FOR BLOOD PRESSURE SUPPORT)
PREFILLED_SYRINGE | INTRAVENOUS | Status: AC
  Filled 2014-10-23: qty 10

## 2014-10-23 MED ORDER — FENTANYL CITRATE (PF) 100 MCG/2ML IJ SOLN
INTRAMUSCULAR | Status: AC
  Filled 2014-10-23: qty 2

## 2014-10-23 MED ORDER — PROPOFOL 10 MG/ML IV BOLUS
INTRAVENOUS | Status: DC | PRN
  Administered 2014-10-23: 100 mg via INTRAVENOUS

## 2014-10-23 MED ORDER — SERTRALINE HCL 100 MG PO TABS
100.0000 mg | ORAL_TABLET | Freq: Every day | ORAL | Status: DC
  Administered 2014-10-24: 100 mg via ORAL
  Filled 2014-10-23: qty 1

## 2014-10-23 MED ORDER — IBUPROFEN 600 MG PO TABS
600.0000 mg | ORAL_TABLET | Freq: Four times a day (QID) | ORAL | Status: DC | PRN
  Administered 2014-10-23: 600 mg via ORAL
  Filled 2014-10-23: qty 1

## 2014-10-23 MED ORDER — MIDAZOLAM HCL 2 MG/2ML IJ SOLN
INTRAMUSCULAR | Status: AC
  Filled 2014-10-23: qty 2

## 2014-10-23 MED ORDER — ONDANSETRON HCL 4 MG/2ML IJ SOLN
INTRAMUSCULAR | Status: DC | PRN
  Administered 2014-10-23: 4 mg via INTRAVENOUS

## 2014-10-23 MED ORDER — DARIFENACIN HYDROBROMIDE ER 7.5 MG PO TB24
7.5000 mg | ORAL_TABLET | Freq: Every day | ORAL | Status: DC
  Administered 2014-10-23 – 2014-10-24 (×2): 7.5 mg via ORAL
  Filled 2014-10-23 (×2): qty 1

## 2014-10-23 MED ORDER — PHENYLEPHRINE 40 MCG/ML (10ML) SYRINGE FOR IV PUSH (FOR BLOOD PRESSURE SUPPORT)
PREFILLED_SYRINGE | INTRAVENOUS | Status: AC
  Filled 2014-10-23: qty 20

## 2014-10-23 MED ORDER — GLYCINE 1.5 % IR SOLN
Status: DC | PRN
  Administered 2014-10-23: 3000 mL

## 2014-10-23 MED ORDER — LACTATED RINGERS IV SOLN
INTRAVENOUS | Status: DC
  Administered 2014-10-23 (×3): via INTRAVENOUS

## 2014-10-23 MED ORDER — DEXAMETHASONE SODIUM PHOSPHATE 10 MG/ML IJ SOLN
INTRAMUSCULAR | Status: AC
  Filled 2014-10-23: qty 1

## 2014-10-23 SURGICAL SUPPLY — 17 items
CANISTER SUCT 3000ML (MISCELLANEOUS) ×3 IMPLANT
CATH ROBINSON RED A/P 16FR (CATHETERS) ×3 IMPLANT
CLOTH BEACON ORANGE TIMEOUT ST (SAFETY) ×3 IMPLANT
CONTAINER PREFILL 10% NBF 60ML (FORM) ×6 IMPLANT
ELECT REM PT RETURN 9FT ADLT (ELECTROSURGICAL) ×3
ELECTRODE REM PT RTRN 9FT ADLT (ELECTROSURGICAL) ×1 IMPLANT
GLOVE BIOGEL PI IND STRL 6.5 (GLOVE) ×1 IMPLANT
GLOVE BIOGEL PI INDICATOR 6.5 (GLOVE) ×2
GLOVE ECLIPSE 6.5 STRL STRAW (GLOVE) ×3 IMPLANT
GOWN STRL REUS W/TWL LRG LVL3 (GOWN DISPOSABLE) ×6 IMPLANT
LOOP ANGLED CUTTING 22FR (CUTTING LOOP) IMPLANT
PACK VAGINAL MINOR WOMEN LF (CUSTOM PROCEDURE TRAY) ×3 IMPLANT
PAD OB MATERNITY 4.3X12.25 (PERSONAL CARE ITEMS) ×3 IMPLANT
TOWEL OR 17X24 6PK STRL BLUE (TOWEL DISPOSABLE) ×6 IMPLANT
TUBING AQUILEX INFLOW (TUBING) ×3 IMPLANT
TUBING AQUILEX OUTFLOW (TUBING) ×3 IMPLANT
WATER STERILE IRR 1000ML POUR (IV SOLUTION) ×3 IMPLANT

## 2014-10-23 NOTE — Anesthesia Procedure Notes (Signed)
Procedure Name: LMA Insertion Date/Time: 10/23/2014 12:54 PM Performed by: Flossie Dibble Pre-anesthesia Checklist: Emergency Drugs available, Timeout performed, Patient being monitored, Patient identified and Suction available Patient Re-evaluated:Patient Re-evaluated prior to inductionOxygen Delivery Method: Circle system utilized Preoxygenation: Pre-oxygenation with 100% oxygen Intubation Type: IV induction Ventilation: Mask ventilation without difficulty LMA: LMA inserted LMA Size: 3.0 Number of attempts: 1 Placement Confirmation: breath sounds checked- equal and bilateral and positive ETCO2 Tube secured with: Tape

## 2014-10-23 NOTE — Progress Notes (Signed)
Called by anesthesia to report that pt developed afib in the postop period.  Cardiology was consulted and recommends overnight observation.   Pt states she is feeling well, no CP, no SOB or other complaints. Admission ordered placed.

## 2014-10-23 NOTE — Consult Note (Signed)
CARDIOLOGY CONSULT NOTE       Patient ID: Lindsay Mills MRN: [id number] DOB/AGE: 69-Mar-1947 69 y.o.  Admit date: 10/23/2014 Referring Physician:  Callahan/Judd Primary Physician: Georgianne Fick, MD Primary Cardiologist:  [facility]/ [place] [facility] Reason for Consultation:  Afib  Active Problems:   * No active hospital problems. *   HPI:  69 y.o. post hysteroscopy for post menopausal bleeding.  ECG from 10/13/14 reviewed and NSR rate 58 ICRBBB.  While in OR noted to have irregular pulse.  Telemetry and ECG noted afib with controlled rate.  She is asymptomatic.  She was admitted to [facility] in [place] 08/2013 with TIA.  Right facial numbness.  She indicates " a plaque broke off from my carotid"  D/C with aspirin.    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  Other than vaginal bleeding that she had the procedure for no other bleeding diathesis   ROS All other systems reviewed and negative except as noted above  Past Medical History  Diagnosis Date  . Scoliosis   . SVD (spontaneous vaginal delivery)     x 1  . Asthma   . Multiple allergies   . Hypothyroidism   . Depression   . Headache     otc med prn  . Seizures     well controlled on medication, last seizure at age 69  . Decreased hearing of right ear     not dx  . Stroke 08/2013    mini stroke, no problems but has some numbness on right check and lower right arm, did not affect her strength     History reviewed. No pertinent family history.  History   Social History  . Marital Status: Married    Spouse Name: N/A  . Number of  Children: N/A  . Years of Education: N/A   Occupational History  . Not on file.   Social History Main Topics  . Smoking status: Former Smoker -- 0.15 packs/day for 15 years    Types: Cigarettes    Quit date: 09/09/1975  . Smokeless tobacco: Never Used  . Alcohol Use: No  . Drug Use: No  . Sexual Activity: Yes    Birth Control/ Protection: Post-menopausal   Other Topics Concern  . Not on file   Social History Narrative    Past Surgical History  Procedure Laterality Date  . Back surgery      x 2, rods in back from shoulder blades to hips  . Left foot surgery  2006  . Right foot surgery  2014  . Ganglion cyst removed      right ring finer  . Tubal ligation  1981  . Colonoscopy    . Upper gi endoscopy    . Dilation and curettage of uterus         . lactated ringers Stopped (10/23/14 1337)    Physical Exam: Blood pressure 127/65, pulse 71, temperature 98.1 F (36.7 C), temperature source Oral, resp. rate 15, SpO2 99 %.    Affect appropriate Healthy:  appears stated age HEENT: normal Neck supple with no adenopathy JVP normal no bruits  no thyromegaly Lungs clear with decreased left diaphragm motion  Heart:  S1/S2 no murmur, no rub, gallop or click PMI normal Abdomen: benighn, BS positve, no tenderness, no AAA no bruit.  No HSM or HJR Distal pulses intact with no bruits No edema Neuro non-focal Skin warm and dry No muscular weakness Post hysteroscopy  Labs:   Lab Results  Component Value Date   WBC 5.8 10/23/2014   HGB 12.9 10/23/2014   HCT 38.1 10/23/2014   MCV 95.7 10/23/2014   PLT 106* 10/23/2014   No results for input(s): NA, K, CL, CO2, BUN, CREATININE, CALCIUM, PROT, BILITOT, ALKPHOS, ALT, AST, GLUCOSE in the last 168 hours.  Invalid input(s): LABALBU No results found for: CKTOTAL, CKMB, CKMBINDEX, TROPONINI No results found for: CHOL No results found for: HDL No results found for: LDLCALC No results found for: TRIG No results found for:  CHOLHDL No results found for: LDLDIRECT    Radiology: No results found.  EKG: 10/13/14  NSR ICRBBB  10/23/14  afib 75 normal ST segments    ASSESSMENT AND PLAN:  PAF:  Discussed at length with patient and husband.  Spoke with Dr Tami Ribas and Buford Dresser.  Admit 24 hr obs.  Telemetry Does not need rate control drug which may indicate SSS.  Check labs to include TSH/T4 PLT and renal to make sure ok to start NOAC Discussed need for anticoagulation given CHAVASC score and previous TIA.  Would start Eliquis 5 bid in am post op if no bleeding issues From procedure.  Outpatient f/u with me with Echo.  Can consider either holter or DCC if afib is persistent   Pulmonary:  No dyspnea  No recent CXR  Limited left diaphragm motion on exam   Signed: Charlton Haws 10/23/2014, 3:57 PM

## 2014-10-23 NOTE — Anesthesia Postprocedure Evaluation (Addendum)
  Anesthesia Post-op Note  Patient: Lindsay Mills  Procedure(s) Performed: Procedure(s) (LRB): DILATATION AND CURETTAGE /HYSTEROSCOPY (N/A)  Patient Location: PACU  Anesthesia Type: General  Level of Consciousness: awake and alert   Airway and Oxygen Therapy: Patient Spontanous Breathing  Post-op Pain: mild  Post-op Assessment: Post-op Vital signs reviewed, Patient's Cardiovascular Status Stable, Respiratory Function Stable, Patent Airway and No signs of Nausea or vomiting  Last Vitals:  Filed Vitals:   10/23/14 1500  BP: 142/84  Pulse: 79  Temp:   Resp: 16    Post-op Vital Signs: stable   Complications:  During the case patient noted to have irregular rhythm on 3 Lead EKG. Upon entering pacu she had completely smooth emergence, hemodynamically stable the entire time, however 12 lead EKG reading atrial fibrillation with rate of 70-90. She denied chest pain/pressure, shortness of breath, fatigue and had no oxygen requirement, blood pressure remained baseline. Following discussions with her family and after thorough chart review I was unable to find any prior episode of atrial fibrillation despite extensive work up including even a 2 week holter monitor and numerous EKGs per her primary care doctor. Given the suspected new onset a-fib we will consult cardiology. Dr. Rogue Bussing is aware and has been updated. She likely will need overnight telemetry monitoring and further work up/interventions per cardiology. We greatly appreciate their assistance.

## 2014-10-23 NOTE — Brief Op Note (Addendum)
10/23/2014  1:24 PM  PATIENT:  Lindsay Mills  69 y.o. female  PRE-OPERATIVE DIAGNOSIS:  Postmenopausal bleeding  POST-OPERATIVE DIAGNOSIS:  same  PROCEDURE:  Procedure(s): DILATATION AND CURETTAGE /HYSTEROSCOPY (N/A)  SURGEON:  Surgeon(s) and Role:    * Allyn Kenner, DO - Primary  ANESTHESIA:   paracervical block and LMA  EBL:  Total I/O In: -  Out: 200 [Urine:200] Hysteroscopic fluid loss: 60cc  LOCAL MEDICATIONS USED:  LIDOCAINE  and Amount: 10 ml  FINDINGS: normal vagina, some erythema of cervix (possibly trauma from prep), otherwise normal, normal atrophic endometrial cavity.  R sided bulge of anterior uterine call into cavity slightly, no obvious fibroid, polyp or hyperplasia.  SPECIMEN:  Source of Specimen:  EMC  DISPOSITION OF SPECIMEN:  PATHOLOGY  COUNTS:  YES  PLAN OF CARE: Discharge to home after PACU  PATIENT DISPOSITION:  PACU - hemodynamically stable.   Delay start of Pharmacological VTE agent (>24hrs) due to surgical blood loss or risk of bleeding: not applicable

## 2014-10-23 NOTE — Transfer of Care (Signed)
Immediate Anesthesia Transfer of Care Note  Patient: Lindsay Mills  Procedure(s) Performed: Procedure(s): DILATATION AND CURETTAGE /HYSTEROSCOPY (N/A)  Patient Location: PACU  Anesthesia Type:General  Level of Consciousness: awake, alert  and oriented  Airway & Oxygen Therapy: Patient Spontanous Breathing and Patient connected to nasal cannula oxygen  Post-op Assessment: Report given to RN and Post -op Vital signs reviewed and stable  Post vital signs: Reviewed and stable  Last Vitals:  Filed Vitals:   10/23/14 1039  BP: 130/96  Pulse: 103  Temp: 36.4 C  Resp: 20    Complications: No apparent anesthesia complications

## 2014-10-23 NOTE — H&P (Signed)
69 y.o. yo complains of postmenopausal bleeding.  [place] 05/13/2014: small uterus: EMS 19mm.  D&C hyst D&C hysteroscopy schedule for 07/2014 - pt cancelled d/t need to address other health issues - fainting, jerking movements.  Now per pt cleared by neurologist.  Rescheduled: pre-op labs show slightly elevated Cr 1.04, low platelets at 111. Normal Hb.  Past Medical History  Diagnosis Date  . Scoliosis   . SVD (spontaneous vaginal delivery)     x 1  . Asthma   . Multiple allergies   . Hypothyroidism   . Depression   . Headache     otc med prn  . Seizures     well controlled on medication, last seizure at age 42  . Decreased hearing of right ear     not dx  . Stroke 08/2013    mini stroke, no problems but has some numbness on right check and lower right arm, did not affect her strength    Past Surgical History  Procedure Laterality Date  . Back surgery      x 2, rods in back from shoulder blades to hips  . Left foot surgery  2006  . Right foot surgery  2014  . Ganglion cyst removed      right ring finer  . Tubal ligation  1981  . Colonoscopy    . Upper gi endoscopy    . Dilation and curettage of uterus      History   Social History  . Marital Status: Married    Spouse Name: N/A  . Number of Children: N/A  . Years of Education: N/A   Occupational History  . Not on file.   Social History Main Topics  . Smoking status: Former Smoker -- 0.15 packs/day for 15 years    Types: Cigarettes    Quit date: 09/09/1975  . Smokeless tobacco: Never Used  . Alcohol Use: No  . Drug Use: No  . Sexual Activity: Yes    Birth Control/ Protection: Post-menopausal   Other Topics Concern  . Not on file   Social History Narrative    No current facility-administered medications on file prior to encounter.   No current outpatient prescriptions on file prior to encounter.    Allergies  Allergen Reactions  . Codeine Anxiety  . Phenytoin Rash    @VITALS2 @  Lungs: clear to  ascultation Cor:  RRR Abdomen:  soft, nontender, nondistended. Ex:  no cords, erythema Pelvic:  Deferred to OR  A: Postmenopausal bleeding   P: All risks, benefits and alternatives d/w patient and she desires to procee with D&C hysteroscopy.   Routine preop care [place], [place]

## 2014-10-24 ENCOUNTER — Other Ambulatory Visit: Payer: Self-pay | Admitting: Internal Medicine

## 2014-10-24 ENCOUNTER — Other Ambulatory Visit: Payer: Self-pay | Admitting: Physician Assistant

## 2014-10-24 DIAGNOSIS — I480 Paroxysmal atrial fibrillation: Secondary | ICD-10-CM | POA: Diagnosis not present

## 2014-10-24 DIAGNOSIS — N858 Other specified noninflammatory disorders of uterus: Secondary | ICD-10-CM | POA: Diagnosis not present

## 2014-10-24 DIAGNOSIS — I4891 Unspecified atrial fibrillation: Secondary | ICD-10-CM

## 2014-10-24 MED ORDER — APIXABAN 5 MG PO TABS: 5.0000 mg | ORAL_TABLET | Freq: Two times a day (BID) | ORAL | Status: AC

## 2014-10-24 MED ORDER — APIXABAN 5 MG PO TABS
5.0000 mg | ORAL_TABLET | Freq: Two times a day (BID) | ORAL | Status: DC
  Administered 2014-10-24: 5 mg via ORAL
  Filled 2014-10-24: qty 1

## 2014-10-24 NOTE — Op Note (Signed)
Lindsay Mills, Lindsay Mills            ACCOUNT NO.:  [id number]  MEDICAL RECORD NO.:  [id number]  LOCATION:  2536                          FACILITY:  [facility]  PHYSICIAN:  Allyn Kenner, DO    DATE OF BIRTH:  Oct 21, 1945  DATE OF PROCEDURE:  10/23/2014 DATE OF DISCHARGE:                              OPERATIVE REPORT   PREOPERATIVE DIAGNOSIS:  __________  POSTOPERATIVE DIAGNOSIS:  __________  PROCEDURE:  Dilation and curettage __________.  SURGEON:  Allyn Kenner, D.O.  ANESTHESIA:  __________ LMA with paracervical block, 1% lidocaine, 10 mL.  ESTIMATED BLOOD LOSS:  Minimal.  URINE OUTPUT:  200 mL.  HYSTEROSCOPIC FLUID LOSS:  60 mL.  FINDINGS:  Normal-appearing vagina, erythema of the cervix which appeared to be possibly due to trauma from prep, otherwise normal and a normal atrophic endometrial cavity.  There was a right-sided bulge of the uterine wall into the cavity slightly.  No obvious fibroid polyps or hyperplasia.  SPECIMENS:  Endometrial curettings to pathology.  DISPOSITION:  To PACU.  DESCRIPTION OF PROCEDURE:  The patient was taken to the operating room where LMA anesthesia was administered and found to be adequate.  She was prepped and draped in the normal sterile fashion in dorsal lithotomy position.  Speculum was placed and the anterior lip of the cervix was grasped with a single-tooth tenaculum.  The cervix was serially dilated to 25 Pratt and hysteroscope was then entered with good visualization of the cavity.  This was performed after 10 mL of lidocaine was placed at the 4 and 8 o'clock positions.  Findings are noted above and sharp curetting was performed of all 4 quadrants.  Specimens sent to Pathology.  No bleeding was noted.  All instruments were removed from the uterus.  The tenaculum was removed from the anterior lip of the cervix and good hemostasis was noted.  All other instruments were removed from the vagina and the patient was cleaned, dried,  and returned to dorsal supine position.  She tolerated the procedure well.  Of note, Anesthesia did say she had a slight arrhythmia during the procedure, but did not __________ procedure.  The patient was taken to the PACU in stable condition.          ______________________________ Allyn Kenner, DO     [place]/MEDQ  D:  10/23/2014  T:  10/24/2014  Job:  [id number]

## 2014-10-24 NOTE — Anesthesia Postprocedure Evaluation (Signed)
Anesthesia Post Note  Patient: Lindsay Mills  Procedure(s) Performed: Procedure(s): DILATATION AND CURETTAGE /HYSTEROSCOPY (N/A)  Anesthesia type: General  Patient location: AICU  Post pain: Comfortable, no distress/discomfort.  Post assessment: Post-op Vital signs reviewed: stable.  Post vital signs: 123/51-68-11 Sp02 99%  Level of consciousness: : Awake, alert, sitting in chair @ bedside ready for discharge to home.  Complications: S/P new onset of atrial fibrillation according to history on chart. Seen by Cardiology ( Dr. Jenkins Rouge) who will follow her in his office.

## 2014-10-24 NOTE — Care Management (Signed)
30 day Eliquis card faxed to patient's pharmacy, [facility]. Venita Sheffield RN BSN CCM [phone]

## 2014-10-24 NOTE — Progress Notes (Signed)
DAILY PROGRESS NOTE  Subjective:  No events overnight. Maintaining sinus bradycardia. Asymptomatic.  Objective:  Temp:  [97.5 F (36.4 C)-98.9 F (37.2 C)] 98.5 F (36.9 C) (07/16 0114) Pulse Rate:  [32-103] 49 (07/16 0800) Resp:  [11-22] 15 (07/16 0800) BP: (111-142)/(51-96) 123/51 mmHg (07/16 0735) SpO2:  [90 %-100 %] 100 % (07/16 0800) Weight:  [124 lb (56.246 kg)] 124 lb (56.246 kg) (07/15 1825) Weight change:   Intake/Output from previous day: 07/15 0701 - 07/16 0700 In: 1400 [I.V.:1400] Out: 650 [Urine:650]  Intake/Output from this shift:    Medications: Current Facility-Administered Medications  Medication Dose Route Frequency Provider Last Rate Last Dose  . apixaban (ELIQUIS) tablet 5 mg  5 mg Oral BID Pixie Casino, MD      . atorvastatin (LIPITOR) tablet 40 mg  40 mg Oral q1800 Allyn Kenner, DO   40 mg at 10/23/14 2215  . darifenacin (ENABLEX) 24 hr tablet 7.5 mg  7.5 mg Oral Daily Allyn Kenner, DO   7.5 mg at 10/23/14 1808  . ibuprofen (ADVIL,MOTRIN) tablet 600 mg  600 mg Oral Q6H PRN Allyn Kenner, DO   600 mg at 10/23/14 1759  . levothyroxine (SYNTHROID, LEVOTHROID) tablet 100 mcg  100 mcg Oral QAC breakfast Allyn Kenner, DO   100 mcg at 10/24/14 0834  . montelukast (SINGULAIR) tablet 10 mg  10 mg Oral q morning - 10a Allyn Kenner, DO      . prenatal multivitamin tablet 1 tablet  1 tablet Oral Q1200 Allyn Kenner, DO      . primidone (MYSOLINE) tablet 250 mg  250 mg Oral BID Allyn Kenner, DO   250 mg at 10/24/14 0834  . sertraline (ZOLOFT) tablet 100 mg  100 mg Oral Daily Allyn Kenner, DO        Physical Exam: General appearance: alert and no distress Lungs: clear to auscultation bilaterally Heart: regular rate and rhythm, S1, S2 normal, no murmur, click, rub or gallop Extremities: extremities normal, atraumatic, no cyanosis or edema Neurologic: Grossly normal  Lab Results: Results for orders placed or performed during the  hospital encounter of 10/23/14 (from the past 48 hour(s))  Platelet count     Status: Abnormal   Collection Time: 10/23/14 11:00 AM  Result Value Ref Range   Platelets 114 (L) 150 - 400 K/uL    Comment: SPECIMEN CHECKED FOR CLOTS REPEATED TO VERIFY CONSISTENT WITH PREVIOUS RESULT   CBC     Status: Abnormal   Collection Time: 10/23/14  3:26 PM  Result Value Ref Range   WBC 5.8 4.0 - 10.5 K/uL   RBC 3.98 3.87 - 5.11 MIL/uL   Hemoglobin 12.9 12.0 - 15.0 g/dL   HCT 38.1 36.0 - 46.0 %   MCV 95.7 78.0 - 100.0 fL   MCH 32.4 26.0 - 34.0 pg   MCHC 33.9 30.0 - 36.0 g/dL   RDW 14.8 11.5 - 15.5 %   Platelets 106 (L) 150 - 400 K/uL  TSH     Status: None   Collection Time: 10/23/14  3:26 PM  Result Value Ref Range   TSH 0.372 0.350 - 4.500 uIU/mL    Comment: Performed at [facility]  T4, free     Status: None   Collection Time: 10/23/14  3:26 PM  Result Value Ref Range   Free T4 0.86 0.61 - 1.12 ng/dL    Comment: Performed at [facility]  Comprehensive metabolic panel     Status: Abnormal  Collection Time: 10/23/14  3:26 PM  Result Value Ref Range   Sodium 141 135 - 145 mmol/L   Potassium 3.8 3.5 - 5.1 mmol/L   Chloride 103 101 - 111 mmol/L   CO2 33 (H) 22 - 32 mmol/L   Glucose, Bld 89 65 - 99 mg/dL   BUN 15 6 - 20 mg/dL   Creatinine, Ser 0.82 0.44 - 1.00 mg/dL   Calcium 8.9 8.9 - 10.3 mg/dL   Total Protein 6.4 (L) 6.5 - 8.1 g/dL   Albumin 3.5 3.5 - 5.0 g/dL   AST 30 15 - 41 U/L   ALT 13 (L) 14 - 54 U/L   Alkaline Phosphatase 60 38 - 126 U/L   Total Bilirubin 0.5 0.3 - 1.2 mg/dL   GFR calc non Af Amer >60 >60 mL/min   GFR calc Af Amer >60 >60 mL/min    Comment: (NOTE) The eGFR has been calculated using the CKD EPI equation. This calculation has not been validated in all clinical situations. eGFR's persistently <60 mL/min signify possible Chronic Kidney Disease.    Anion gap 5 5 - 15  Troponin I     Status: None   Collection Time: 10/23/14  3:26 PM  Result  Value Ref Range   Troponin I <0.03 <0.031 ng/mL    Comment:        NO INDICATION OF MYOCARDIAL INJURY. Performed at [facility]     Imaging: No results found.  Assessment:  Active Problems:   Atrial fibrillation   Plan:  1. Start Eliquis 5 mg BID today. Labs reviewed, renal function stable. Follow-up with Dr. Johnsie Cancel in our [address] office after discharge. Will arrange for outpatient echo. Please call the office to schedule an appointment. Cardiology will sign-off. Call with questions.  Time Spent Directly with Patient:  30 minutes  Length of Stay:  LOS: 1 day   Pixie Casino, MD, [facility] Attending Cardiologist [place] 10/24/2014, 8:58 AM

## 2014-10-24 NOTE — Discharge Summary (Signed)
  Pt presented for D&C hysteroscopy for postmenopausal bleeding.  Pt tolerated procedure well, however anesthesia noted a slight arrhythmia during the procedure.  In PACU afib was found and cariology was consulted.  They recommended overnight monitoring and patient remained stable with no afib overnight.  Cardiology recommends outpt f/u with them for echo and event monitoring.  They have started her on Eliquis 5mg  bid and case management has been consulted to assist pt with Rx card to defer costs.  She will f/u with me in the office within 2 weeks to review pathology.  Throughout her stay pt denied any CP/SOB or sensation of arrhythmia.

## 2014-10-24 NOTE — Discharge Instructions (Signed)
Apixaban oral tablets What is this medicine? APIXABAN (a PIX a ban) is an anticoagulant (blood thinner). It is used to lower the chance of stroke in people with a medical condition called atrial fibrillation. It is also used to treat or prevent blood clots in the lungs or in the veins. This medicine may be used for other purposes; ask your health care provider or pharmacist if you have questions. COMMON BRAND NAME(S): Eliquis What should I tell my health care provider before I take this medicine? They need to know if you have any of these conditions: -bleeding disorders -bleeding in the brain -blood in your stools (black or tarry stools) or if you have blood in your vomit -history of stomach bleeding -kidney disease -liver disease -mechanical heart valve -an unusual or allergic reaction to apixaban, other medicines, foods, dyes, or preservatives -pregnant or trying to get pregnant -breast-feeding How should I use this medicine? Take this medicine by mouth with a glass of water. Follow the directions on the prescription label. You can take it with or without food. If it upsets your stomach, take it with food. Take your medicine at regular intervals. Do not take it more often than directed. Do not stop taking except on your doctor's advice. Stopping this medicine may increase your risk of a blot clot. Be sure to refill your prescription before you run out of medicine. Talk to your pediatrician regarding the use of this medicine in children. Special care may be needed. Overdosage: If you think you have taken too much of this medicine contact a poison control center or emergency room at once. NOTE: This medicine is only for you. Do not share this medicine with others. What if I miss a dose? If you miss a dose, take it as soon as you can. If it is almost time for your next dose, take only that dose. Do not take double or extra doses. What may interact with this medicine? This medicine may  interact with the following: -aspirin and aspirin-like medicines -certain medicines for fungal infections like ketoconazole and itraconazole -certain medicines for seizures like carbamazepine and phenytoin -certain medicines that treat or prevent blood clots like warfarin, enoxaparin, and dalteparin -clarithromycin -NSAIDs, medicines for pain and inflammation, like ibuprofen or naproxen -rifampin -ritonavir -St. John's wort This list may not describe all possible interactions. Give your health care provider a list of all the medicines, herbs, non-prescription drugs, or dietary supplements you use. Also tell them if you smoke, drink alcohol, or use illegal drugs. Some items may interact with your medicine. What should I watch for while using this medicine? Notify your doctor or health care professional and seek emergency treatment if you develop breathing problems; changes in vision; chest pain; severe, sudden headache; pain, swelling, warmth in the leg; trouble speaking; sudden numbness or weakness of the face, arm, or leg. These can be signs that your condition has gotten worse. If you are going to have surgery, tell your doctor or health care professional that you are taking this medicine. Tell your health care professional that you use this medicine before you have a spinal or epidural procedure. Sometimes people who take this medicine have bleeding problems around the spine when they have a spinal or epidural procedure. This bleeding is very rare. If you have a spinal or epidural procedure while on this medicine, call your health care professional immediately if you have back pain, numbness or tingling (especially in your legs and feet), muscle weakness, paralysis, or loss   of bladder or bowel control. Avoid sports and activities that might cause injury while you are using this medicine. Severe falls or injuries can cause unseen bleeding. Be careful when using sharp tools or knives. Consider using  an electric razor. Take special care brushing or flossing your teeth. Report any injuries, bruising, or red spots on the skin to your doctor or health care professional. What side effects may I notice from receiving this medicine? Side effects that you should report to your doctor or health care professional as soon as possible: -allergic reactions like skin rash, itching or hives, swelling of the face, lips, or tongue -signs and symptoms of bleeding such as bloody or black, tarry stools; red or dark-brown urine; spitting up blood or brown material that looks like coffee grounds; red spots on the skin; unusual bruising or bleeding from the eye, gums, or nose This list may not describe all possible side effects. Call your doctor for medical advice about side effects. You may report side effects to FDA at 1-800-FDA-1088. Where should I keep my medicine? Keep out of the reach of children. Store at room temperature between 20 and 25 degrees C (68 and 77 degrees F). Throw away any unused medicine after the expiration date. NOTE: This sheet is a summary. It may not cover all possible information. If you have questions about this medicine, talk to your doctor, pharmacist, or health care provider.  2015, Elsevier/Gold Standard. (2012-11-29 11:59:24)  

## 2014-10-24 NOTE — Addendum Note (Signed)
Addendum  created 10/24/14 1151 by Flossie Dibble, CRNA   Modules edited: Notes Section   Notes Section:  File: [id number]

## 2014-10-26 ENCOUNTER — Encounter (HOSPITAL_COMMUNITY): Payer: Self-pay | Admitting: Obstetrics and Gynecology

## 2014-10-27 ENCOUNTER — Telehealth: Payer: Self-pay | Admitting: Cardiovascular Disease

## 2014-10-27 NOTE — Telephone Encounter (Signed)
Left message to call back  

## 2014-10-28 NOTE — Telephone Encounter (Signed)
lmtcb

## 2014-10-30 ENCOUNTER — Other Ambulatory Visit (HOSPITAL_COMMUNITY): Payer: Medicare Other

## 2014-11-02 ENCOUNTER — Telehealth: Payer: Self-pay | Admitting: Cardiovascular Disease

## 2014-11-02 NOTE — Telephone Encounter (Signed)
SPOKE WITH  PT  RE  MESSAGE  PT  IS NOT  GOING TO FOLLOW UP IN THIS OFFICE  WAS  NOT  HAPPY WITH  CARE  PER  PT HAS  NOVANT  CARDIOLOGIST  IN  [place]  WILL FOLLOW UP  WITH ESTABLISHED  MD./CY

## 2014-11-02 NOTE — Telephone Encounter (Deleted)
Error

## 2015-04-14 DIAGNOSIS — E876 Hypokalemia: Secondary | ICD-10-CM | POA: Diagnosis not present

## 2015-04-14 DIAGNOSIS — I959 Hypotension, unspecified: Secondary | ICD-10-CM | POA: Diagnosis not present

## 2015-04-15 DIAGNOSIS — I959 Hypotension, unspecified: Secondary | ICD-10-CM | POA: Diagnosis not present

## 2015-04-15 DIAGNOSIS — E876 Hypokalemia: Secondary | ICD-10-CM | POA: Diagnosis not present

## 2015-04-27 DIAGNOSIS — R55 Syncope and collapse: Secondary | ICD-10-CM | POA: Diagnosis not present

## 2015-04-27 DIAGNOSIS — I6930 Unspecified sequelae of cerebral infarction: Secondary | ICD-10-CM | POA: Diagnosis not present

## 2015-04-27 DIAGNOSIS — E876 Hypokalemia: Secondary | ICD-10-CM | POA: Diagnosis not present

## 2015-04-29 DIAGNOSIS — I480 Paroxysmal atrial fibrillation: Secondary | ICD-10-CM | POA: Diagnosis not present

## 2015-04-29 DIAGNOSIS — I4891 Unspecified atrial fibrillation: Secondary | ICD-10-CM | POA: Diagnosis not present

## 2015-04-29 DIAGNOSIS — Z9889 Other specified postprocedural states: Secondary | ICD-10-CM | POA: Diagnosis not present

## 2015-04-29 DIAGNOSIS — I499 Cardiac arrhythmia, unspecified: Secondary | ICD-10-CM | POA: Diagnosis not present

## 2015-04-29 DIAGNOSIS — I471 Supraventricular tachycardia: Secondary | ICD-10-CM | POA: Diagnosis not present

## 2015-05-03 DIAGNOSIS — I10 Essential (primary) hypertension: Secondary | ICD-10-CM | POA: Diagnosis not present

## 2015-05-03 DIAGNOSIS — I480 Paroxysmal atrial fibrillation: Secondary | ICD-10-CM | POA: Diagnosis not present

## 2015-05-03 DIAGNOSIS — I471 Supraventricular tachycardia: Secondary | ICD-10-CM | POA: Diagnosis not present

## 2015-05-09 DIAGNOSIS — M1990 Unspecified osteoarthritis, unspecified site: Secondary | ICD-10-CM | POA: Diagnosis not present

## 2015-05-09 DIAGNOSIS — I482 Chronic atrial fibrillation: Secondary | ICD-10-CM | POA: Diagnosis not present

## 2015-05-09 DIAGNOSIS — Z87891 Personal history of nicotine dependence: Secondary | ICD-10-CM | POA: Diagnosis not present

## 2015-05-09 DIAGNOSIS — S0003XA Contusion of scalp, initial encounter: Secondary | ICD-10-CM | POA: Diagnosis not present

## 2015-05-09 DIAGNOSIS — R404 Transient alteration of awareness: Secondary | ICD-10-CM | POA: Diagnosis not present

## 2015-05-09 DIAGNOSIS — S0191XA Laceration without foreign body of unspecified part of head, initial encounter: Secondary | ICD-10-CM | POA: Diagnosis not present

## 2015-05-09 DIAGNOSIS — R55 Syncope and collapse: Secondary | ICD-10-CM | POA: Diagnosis not present

## 2015-05-09 DIAGNOSIS — S0101XA Laceration without foreign body of scalp, initial encounter: Secondary | ICD-10-CM | POA: Diagnosis not present

## 2015-05-09 DIAGNOSIS — R9431 Abnormal electrocardiogram [ECG] [EKG]: Secondary | ICD-10-CM | POA: Diagnosis not present

## 2015-05-09 DIAGNOSIS — Z885 Allergy status to narcotic agent status: Secondary | ICD-10-CM | POA: Diagnosis not present

## 2015-05-10 DIAGNOSIS — I480 Paroxysmal atrial fibrillation: Secondary | ICD-10-CM | POA: Diagnosis not present

## 2015-05-10 DIAGNOSIS — I340 Nonrheumatic mitral (valve) insufficiency: Secondary | ICD-10-CM | POA: Diagnosis not present

## 2015-05-10 DIAGNOSIS — I517 Cardiomegaly: Secondary | ICD-10-CM | POA: Diagnosis not present

## 2015-05-10 DIAGNOSIS — I4891 Unspecified atrial fibrillation: Secondary | ICD-10-CM | POA: Diagnosis not present

## 2015-05-25 DIAGNOSIS — J45909 Unspecified asthma, uncomplicated: Secondary | ICD-10-CM | POA: Diagnosis not present

## 2015-05-25 DIAGNOSIS — M1990 Unspecified osteoarthritis, unspecified site: Secondary | ICD-10-CM | POA: Diagnosis not present

## 2015-05-25 DIAGNOSIS — G319 Degenerative disease of nervous system, unspecified: Secondary | ICD-10-CM | POA: Diagnosis not present

## 2015-05-25 DIAGNOSIS — G40909 Epilepsy, unspecified, not intractable, without status epilepticus: Secondary | ICD-10-CM | POA: Diagnosis not present

## 2015-05-25 DIAGNOSIS — S99921A Unspecified injury of right foot, initial encounter: Secondary | ICD-10-CM | POA: Diagnosis not present

## 2015-05-25 DIAGNOSIS — I6782 Cerebral ischemia: Secondary | ICD-10-CM | POA: Diagnosis not present

## 2015-05-25 DIAGNOSIS — M7989 Other specified soft tissue disorders: Secondary | ICD-10-CM | POA: Diagnosis not present

## 2015-05-25 DIAGNOSIS — S99911A Unspecified injury of right ankle, initial encounter: Secondary | ICD-10-CM | POA: Diagnosis not present

## 2015-05-25 DIAGNOSIS — S82831A Other fracture of upper and lower end of right fibula, initial encounter for closed fracture: Secondary | ICD-10-CM | POA: Diagnosis not present

## 2015-05-25 DIAGNOSIS — E559 Vitamin D deficiency, unspecified: Secondary | ICD-10-CM | POA: Diagnosis not present

## 2015-05-25 DIAGNOSIS — R55 Syncope and collapse: Secondary | ICD-10-CM | POA: Diagnosis not present

## 2015-05-25 DIAGNOSIS — S82201D Unspecified fracture of shaft of right tibia, subsequent encounter for closed fracture with routine healing: Secondary | ICD-10-CM | POA: Diagnosis not present

## 2015-05-25 DIAGNOSIS — R404 Transient alteration of awareness: Secondary | ICD-10-CM | POA: Diagnosis not present

## 2015-05-25 DIAGNOSIS — I10 Essential (primary) hypertension: Secondary | ICD-10-CM | POA: Diagnosis not present

## 2015-05-25 DIAGNOSIS — I517 Cardiomegaly: Secondary | ICD-10-CM | POA: Diagnosis not present

## 2015-05-25 DIAGNOSIS — J449 Chronic obstructive pulmonary disease, unspecified: Secondary | ICD-10-CM | POA: Diagnosis not present

## 2015-05-25 DIAGNOSIS — B373 Candidiasis of vulva and vagina: Secondary | ICD-10-CM | POA: Diagnosis not present

## 2015-05-25 DIAGNOSIS — M4312 Spondylolisthesis, cervical region: Secondary | ICD-10-CM | POA: Diagnosis not present

## 2015-05-25 DIAGNOSIS — I951 Orthostatic hypotension: Secondary | ICD-10-CM | POA: Diagnosis not present

## 2015-05-25 DIAGNOSIS — D7589 Other specified diseases of blood and blood-forming organs: Secondary | ICD-10-CM | POA: Diagnosis not present

## 2015-05-25 DIAGNOSIS — D696 Thrombocytopenia, unspecified: Secondary | ICD-10-CM | POA: Diagnosis not present

## 2015-05-25 DIAGNOSIS — I471 Supraventricular tachycardia: Secondary | ICD-10-CM | POA: Diagnosis not present

## 2015-05-25 DIAGNOSIS — I4891 Unspecified atrial fibrillation: Secondary | ICD-10-CM | POA: Diagnosis not present

## 2015-05-25 DIAGNOSIS — E039 Hypothyroidism, unspecified: Secondary | ICD-10-CM | POA: Diagnosis not present

## 2015-05-25 DIAGNOSIS — R918 Other nonspecific abnormal finding of lung field: Secondary | ICD-10-CM | POA: Diagnosis not present

## 2015-05-25 DIAGNOSIS — R531 Weakness: Secondary | ICD-10-CM | POA: Diagnosis not present

## 2015-05-25 DIAGNOSIS — I340 Nonrheumatic mitral (valve) insufficiency: Secondary | ICD-10-CM | POA: Diagnosis not present

## 2015-05-25 DIAGNOSIS — I480 Paroxysmal atrial fibrillation: Secondary | ICD-10-CM | POA: Diagnosis not present

## 2015-05-25 DIAGNOSIS — M542 Cervicalgia: Secondary | ICD-10-CM | POA: Diagnosis not present

## 2015-05-25 DIAGNOSIS — S82401D Unspecified fracture of shaft of right fibula, subsequent encounter for closed fracture with routine healing: Secondary | ICD-10-CM | POA: Diagnosis not present

## 2015-05-26 DIAGNOSIS — R55 Syncope and collapse: Secondary | ICD-10-CM | POA: Diagnosis not present

## 2015-05-26 DIAGNOSIS — S82831A Other fracture of upper and lower end of right fibula, initial encounter for closed fracture: Secondary | ICD-10-CM | POA: Diagnosis not present

## 2015-05-26 DIAGNOSIS — G40909 Epilepsy, unspecified, not intractable, without status epilepticus: Secondary | ICD-10-CM | POA: Diagnosis not present

## 2015-06-02 DIAGNOSIS — G40909 Epilepsy, unspecified, not intractable, without status epilepticus: Secondary | ICD-10-CM | POA: Diagnosis not present

## 2015-06-02 DIAGNOSIS — R258 Other abnormal involuntary movements: Secondary | ICD-10-CM | POA: Diagnosis not present

## 2015-06-02 DIAGNOSIS — E039 Hypothyroidism, unspecified: Secondary | ICD-10-CM | POA: Diagnosis not present

## 2015-06-02 DIAGNOSIS — N390 Urinary tract infection, site not specified: Secondary | ICD-10-CM | POA: Diagnosis not present

## 2015-06-02 DIAGNOSIS — M25571 Pain in right ankle and joints of right foot: Secondary | ICD-10-CM | POA: Diagnosis not present

## 2015-06-02 DIAGNOSIS — I951 Orthostatic hypotension: Secondary | ICD-10-CM | POA: Diagnosis not present

## 2015-06-02 DIAGNOSIS — J45909 Unspecified asthma, uncomplicated: Secondary | ICD-10-CM | POA: Diagnosis not present

## 2015-06-02 DIAGNOSIS — R55 Syncope and collapse: Secondary | ICD-10-CM | POA: Diagnosis not present

## 2015-06-02 DIAGNOSIS — M79604 Pain in right leg: Secondary | ICD-10-CM | POA: Diagnosis not present

## 2015-06-02 DIAGNOSIS — S82401D Unspecified fracture of shaft of right fibula, subsequent encounter for closed fracture with routine healing: Secondary | ICD-10-CM | POA: Diagnosis not present

## 2015-06-02 DIAGNOSIS — J449 Chronic obstructive pulmonary disease, unspecified: Secondary | ICD-10-CM | POA: Diagnosis not present

## 2015-06-02 DIAGNOSIS — I10 Essential (primary) hypertension: Secondary | ICD-10-CM | POA: Diagnosis not present

## 2015-06-02 DIAGNOSIS — S82831D Other fracture of upper and lower end of right fibula, subsequent encounter for closed fracture with routine healing: Secondary | ICD-10-CM | POA: Diagnosis not present

## 2015-06-02 DIAGNOSIS — M1990 Unspecified osteoarthritis, unspecified site: Secondary | ICD-10-CM | POA: Diagnosis not present

## 2015-06-02 DIAGNOSIS — S82831S Other fracture of upper and lower end of right fibula, sequela: Secondary | ICD-10-CM | POA: Diagnosis not present

## 2015-06-02 DIAGNOSIS — E673 Hypervitaminosis D: Secondary | ICD-10-CM | POA: Diagnosis not present

## 2015-06-02 DIAGNOSIS — I480 Paroxysmal atrial fibrillation: Secondary | ICD-10-CM | POA: Diagnosis not present

## 2015-06-02 DIAGNOSIS — E559 Vitamin D deficiency, unspecified: Secondary | ICD-10-CM | POA: Diagnosis not present

## 2015-06-04 DIAGNOSIS — E673 Hypervitaminosis D: Secondary | ICD-10-CM | POA: Diagnosis not present

## 2015-06-04 DIAGNOSIS — I10 Essential (primary) hypertension: Secondary | ICD-10-CM | POA: Diagnosis not present

## 2015-06-04 DIAGNOSIS — I951 Orthostatic hypotension: Secondary | ICD-10-CM | POA: Diagnosis not present

## 2015-06-04 DIAGNOSIS — S82831S Other fracture of upper and lower end of right fibula, sequela: Secondary | ICD-10-CM | POA: Diagnosis not present

## 2015-06-15 DIAGNOSIS — I951 Orthostatic hypotension: Secondary | ICD-10-CM | POA: Diagnosis not present

## 2015-06-15 DIAGNOSIS — N390 Urinary tract infection, site not specified: Secondary | ICD-10-CM | POA: Diagnosis not present

## 2015-06-15 DIAGNOSIS — R258 Other abnormal involuntary movements: Secondary | ICD-10-CM | POA: Diagnosis not present

## 2015-06-16 DIAGNOSIS — I951 Orthostatic hypotension: Secondary | ICD-10-CM | POA: Diagnosis not present

## 2015-06-17 DIAGNOSIS — S82831D Other fracture of upper and lower end of right fibula, subsequent encounter for closed fracture with routine healing: Secondary | ICD-10-CM | POA: Diagnosis not present

## 2015-06-17 DIAGNOSIS — M25571 Pain in right ankle and joints of right foot: Secondary | ICD-10-CM | POA: Diagnosis not present

## 2015-06-17 DIAGNOSIS — M79604 Pain in right leg: Secondary | ICD-10-CM | POA: Diagnosis not present

## 2015-06-22 DIAGNOSIS — I480 Paroxysmal atrial fibrillation: Secondary | ICD-10-CM | POA: Diagnosis not present

## 2015-06-22 DIAGNOSIS — E673 Hypervitaminosis D: Secondary | ICD-10-CM | POA: Diagnosis not present

## 2015-06-22 DIAGNOSIS — I951 Orthostatic hypotension: Secondary | ICD-10-CM | POA: Diagnosis not present

## 2015-06-23 DIAGNOSIS — J449 Chronic obstructive pulmonary disease, unspecified: Secondary | ICD-10-CM | POA: Diagnosis not present

## 2015-06-23 DIAGNOSIS — M150 Primary generalized (osteo)arthritis: Secondary | ICD-10-CM | POA: Diagnosis not present

## 2015-06-23 DIAGNOSIS — J45909 Unspecified asthma, uncomplicated: Secondary | ICD-10-CM | POA: Diagnosis not present

## 2015-06-23 DIAGNOSIS — G40909 Epilepsy, unspecified, not intractable, without status epilepticus: Secondary | ICD-10-CM | POA: Diagnosis not present

## 2015-06-23 DIAGNOSIS — S82401D Unspecified fracture of shaft of right fibula, subsequent encounter for closed fracture with routine healing: Secondary | ICD-10-CM | POA: Diagnosis not present

## 2015-06-28 DIAGNOSIS — I480 Paroxysmal atrial fibrillation: Secondary | ICD-10-CM | POA: Diagnosis not present

## 2015-06-28 DIAGNOSIS — Z09 Encounter for follow-up examination after completed treatment for conditions other than malignant neoplasm: Secondary | ICD-10-CM | POA: Diagnosis not present

## 2015-06-28 DIAGNOSIS — S82401A Unspecified fracture of shaft of right fibula, initial encounter for closed fracture: Secondary | ICD-10-CM | POA: Diagnosis not present

## 2015-06-28 DIAGNOSIS — R55 Syncope and collapse: Secondary | ICD-10-CM | POA: Diagnosis not present

## 2015-06-29 DIAGNOSIS — J45909 Unspecified asthma, uncomplicated: Secondary | ICD-10-CM | POA: Diagnosis not present

## 2015-06-29 DIAGNOSIS — S82401D Unspecified fracture of shaft of right fibula, subsequent encounter for closed fracture with routine healing: Secondary | ICD-10-CM | POA: Diagnosis not present

## 2015-06-29 DIAGNOSIS — M150 Primary generalized (osteo)arthritis: Secondary | ICD-10-CM | POA: Diagnosis not present

## 2015-06-29 DIAGNOSIS — J449 Chronic obstructive pulmonary disease, unspecified: Secondary | ICD-10-CM | POA: Diagnosis not present

## 2015-06-29 DIAGNOSIS — G40909 Epilepsy, unspecified, not intractable, without status epilepticus: Secondary | ICD-10-CM | POA: Diagnosis not present

## 2015-06-30 DIAGNOSIS — I951 Orthostatic hypotension: Secondary | ICD-10-CM | POA: Diagnosis not present

## 2015-06-30 DIAGNOSIS — I471 Supraventricular tachycardia: Secondary | ICD-10-CM | POA: Diagnosis not present

## 2015-06-30 DIAGNOSIS — I480 Paroxysmal atrial fibrillation: Secondary | ICD-10-CM | POA: Diagnosis not present

## 2015-06-30 DIAGNOSIS — R55 Syncope and collapse: Secondary | ICD-10-CM | POA: Diagnosis not present

## 2015-07-07 DIAGNOSIS — J449 Chronic obstructive pulmonary disease, unspecified: Secondary | ICD-10-CM | POA: Diagnosis not present

## 2015-07-07 DIAGNOSIS — M1990 Unspecified osteoarthritis, unspecified site: Secondary | ICD-10-CM | POA: Diagnosis not present

## 2015-07-07 DIAGNOSIS — G40909 Epilepsy, unspecified, not intractable, without status epilepticus: Secondary | ICD-10-CM | POA: Diagnosis not present

## 2015-07-07 DIAGNOSIS — M150 Primary generalized (osteo)arthritis: Secondary | ICD-10-CM | POA: Diagnosis not present

## 2015-07-07 DIAGNOSIS — I10 Essential (primary) hypertension: Secondary | ICD-10-CM | POA: Diagnosis not present

## 2015-07-07 DIAGNOSIS — J45909 Unspecified asthma, uncomplicated: Secondary | ICD-10-CM | POA: Diagnosis not present

## 2015-07-07 DIAGNOSIS — S82401D Unspecified fracture of shaft of right fibula, subsequent encounter for closed fracture with routine healing: Secondary | ICD-10-CM | POA: Diagnosis not present

## 2015-07-07 DIAGNOSIS — Z7901 Long term (current) use of anticoagulants: Secondary | ICD-10-CM | POA: Diagnosis not present

## 2015-07-09 DIAGNOSIS — M25571 Pain in right ankle and joints of right foot: Secondary | ICD-10-CM | POA: Diagnosis not present

## 2015-07-09 DIAGNOSIS — S82831D Other fracture of upper and lower end of right fibula, subsequent encounter for closed fracture with routine healing: Secondary | ICD-10-CM | POA: Diagnosis not present

## 2015-07-09 DIAGNOSIS — M150 Primary generalized (osteo)arthritis: Secondary | ICD-10-CM | POA: Diagnosis not present

## 2015-07-09 DIAGNOSIS — J45909 Unspecified asthma, uncomplicated: Secondary | ICD-10-CM | POA: Diagnosis not present

## 2015-07-09 DIAGNOSIS — J449 Chronic obstructive pulmonary disease, unspecified: Secondary | ICD-10-CM | POA: Diagnosis not present

## 2015-07-09 DIAGNOSIS — S82401D Unspecified fracture of shaft of right fibula, subsequent encounter for closed fracture with routine healing: Secondary | ICD-10-CM | POA: Diagnosis not present

## 2015-07-16 DIAGNOSIS — M150 Primary generalized (osteo)arthritis: Secondary | ICD-10-CM | POA: Diagnosis not present

## 2015-07-16 DIAGNOSIS — J45909 Unspecified asthma, uncomplicated: Secondary | ICD-10-CM | POA: Diagnosis not present

## 2015-07-16 DIAGNOSIS — S82401D Unspecified fracture of shaft of right fibula, subsequent encounter for closed fracture with routine healing: Secondary | ICD-10-CM | POA: Diagnosis not present

## 2015-07-16 DIAGNOSIS — G40909 Epilepsy, unspecified, not intractable, without status epilepticus: Secondary | ICD-10-CM | POA: Diagnosis not present

## 2015-07-16 DIAGNOSIS — J449 Chronic obstructive pulmonary disease, unspecified: Secondary | ICD-10-CM | POA: Diagnosis not present

## 2015-07-22 DIAGNOSIS — S82401D Unspecified fracture of shaft of right fibula, subsequent encounter for closed fracture with routine healing: Secondary | ICD-10-CM | POA: Diagnosis not present

## 2015-07-22 DIAGNOSIS — G40909 Epilepsy, unspecified, not intractable, without status epilepticus: Secondary | ICD-10-CM | POA: Diagnosis not present

## 2015-07-22 DIAGNOSIS — J449 Chronic obstructive pulmonary disease, unspecified: Secondary | ICD-10-CM | POA: Diagnosis not present

## 2015-07-22 DIAGNOSIS — M150 Primary generalized (osteo)arthritis: Secondary | ICD-10-CM | POA: Diagnosis not present

## 2015-07-22 DIAGNOSIS — J45909 Unspecified asthma, uncomplicated: Secondary | ICD-10-CM | POA: Diagnosis not present

## 2015-07-23 DIAGNOSIS — J45909 Unspecified asthma, uncomplicated: Secondary | ICD-10-CM | POA: Diagnosis not present

## 2015-07-23 DIAGNOSIS — S82401D Unspecified fracture of shaft of right fibula, subsequent encounter for closed fracture with routine healing: Secondary | ICD-10-CM | POA: Diagnosis not present

## 2015-07-23 DIAGNOSIS — J449 Chronic obstructive pulmonary disease, unspecified: Secondary | ICD-10-CM | POA: Diagnosis not present

## 2015-07-23 DIAGNOSIS — M150 Primary generalized (osteo)arthritis: Secondary | ICD-10-CM | POA: Diagnosis not present

## 2015-07-23 DIAGNOSIS — G40909 Epilepsy, unspecified, not intractable, without status epilepticus: Secondary | ICD-10-CM | POA: Diagnosis not present

## 2015-08-06 DIAGNOSIS — M25571 Pain in right ankle and joints of right foot: Secondary | ICD-10-CM | POA: Diagnosis not present

## 2015-08-06 DIAGNOSIS — S82831D Other fracture of upper and lower end of right fibula, subsequent encounter for closed fracture with routine healing: Secondary | ICD-10-CM | POA: Diagnosis not present

## 2015-08-10 DIAGNOSIS — G40909 Epilepsy, unspecified, not intractable, without status epilepticus: Secondary | ICD-10-CM | POA: Diagnosis not present

## 2015-08-10 DIAGNOSIS — M150 Primary generalized (osteo)arthritis: Secondary | ICD-10-CM | POA: Diagnosis not present

## 2015-08-10 DIAGNOSIS — J45909 Unspecified asthma, uncomplicated: Secondary | ICD-10-CM | POA: Diagnosis not present

## 2015-08-10 DIAGNOSIS — S82401D Unspecified fracture of shaft of right fibula, subsequent encounter for closed fracture with routine healing: Secondary | ICD-10-CM | POA: Diagnosis not present

## 2015-08-10 DIAGNOSIS — J449 Chronic obstructive pulmonary disease, unspecified: Secondary | ICD-10-CM | POA: Diagnosis not present

## 2015-08-16 DIAGNOSIS — R55 Syncope and collapse: Secondary | ICD-10-CM | POA: Diagnosis not present

## 2015-08-19 DIAGNOSIS — I471 Supraventricular tachycardia: Secondary | ICD-10-CM | POA: Diagnosis not present

## 2015-08-19 DIAGNOSIS — I480 Paroxysmal atrial fibrillation: Secondary | ICD-10-CM | POA: Diagnosis not present

## 2015-08-19 DIAGNOSIS — R001 Bradycardia, unspecified: Secondary | ICD-10-CM | POA: Diagnosis not present

## 2015-08-19 DIAGNOSIS — I495 Sick sinus syndrome: Secondary | ICD-10-CM | POA: Diagnosis not present

## 2015-08-19 DIAGNOSIS — Z8673 Personal history of transient ischemic attack (TIA), and cerebral infarction without residual deficits: Secondary | ICD-10-CM | POA: Diagnosis not present

## 2015-08-21 DIAGNOSIS — M150 Primary generalized (osteo)arthritis: Secondary | ICD-10-CM | POA: Diagnosis not present

## 2015-08-21 DIAGNOSIS — J449 Chronic obstructive pulmonary disease, unspecified: Secondary | ICD-10-CM | POA: Diagnosis not present

## 2015-08-21 DIAGNOSIS — G40909 Epilepsy, unspecified, not intractable, without status epilepticus: Secondary | ICD-10-CM | POA: Diagnosis not present

## 2015-08-21 DIAGNOSIS — S82401D Unspecified fracture of shaft of right fibula, subsequent encounter for closed fracture with routine healing: Secondary | ICD-10-CM | POA: Diagnosis not present

## 2015-08-21 DIAGNOSIS — J45909 Unspecified asthma, uncomplicated: Secondary | ICD-10-CM | POA: Diagnosis not present

## 2015-08-23 DIAGNOSIS — I82612 Acute embolism and thrombosis of superficial veins of left upper extremity: Secondary | ICD-10-CM | POA: Diagnosis not present

## 2015-08-23 DIAGNOSIS — R197 Diarrhea, unspecified: Secondary | ICD-10-CM | POA: Diagnosis not present

## 2015-08-23 DIAGNOSIS — A047 Enterocolitis due to Clostridium difficile: Secondary | ICD-10-CM | POA: Diagnosis not present

## 2015-08-23 DIAGNOSIS — I4892 Unspecified atrial flutter: Secondary | ICD-10-CM | POA: Diagnosis not present

## 2015-08-23 DIAGNOSIS — I483 Typical atrial flutter: Secondary | ICD-10-CM | POA: Diagnosis not present

## 2015-08-23 DIAGNOSIS — I471 Supraventricular tachycardia: Secondary | ICD-10-CM | POA: Diagnosis not present

## 2015-08-23 DIAGNOSIS — I6522 Occlusion and stenosis of left carotid artery: Secondary | ICD-10-CM | POA: Diagnosis not present

## 2015-08-23 DIAGNOSIS — R404 Transient alteration of awareness: Secondary | ICD-10-CM | POA: Diagnosis not present

## 2015-08-23 DIAGNOSIS — I951 Orthostatic hypotension: Secondary | ICD-10-CM | POA: Diagnosis not present

## 2015-08-23 DIAGNOSIS — I10 Essential (primary) hypertension: Secondary | ICD-10-CM | POA: Diagnosis not present

## 2015-08-23 DIAGNOSIS — R938 Abnormal findings on diagnostic imaging of other specified body structures: Secondary | ICD-10-CM | POA: Diagnosis not present

## 2015-08-23 DIAGNOSIS — J449 Chronic obstructive pulmonary disease, unspecified: Secondary | ICD-10-CM | POA: Diagnosis not present

## 2015-08-23 DIAGNOSIS — I480 Paroxysmal atrial fibrillation: Secondary | ICD-10-CM | POA: Diagnosis not present

## 2015-08-23 DIAGNOSIS — M419 Scoliosis, unspecified: Secondary | ICD-10-CM | POA: Diagnosis not present

## 2015-08-23 DIAGNOSIS — R202 Paresthesia of skin: Secondary | ICD-10-CM | POA: Diagnosis not present

## 2015-08-23 DIAGNOSIS — R079 Chest pain, unspecified: Secondary | ICD-10-CM | POA: Diagnosis not present

## 2015-08-23 DIAGNOSIS — Z8673 Personal history of transient ischemic attack (TIA), and cerebral infarction without residual deficits: Secondary | ICD-10-CM | POA: Diagnosis not present

## 2015-08-23 DIAGNOSIS — I82C12 Acute embolism and thrombosis of left internal jugular vein: Secondary | ICD-10-CM | POA: Diagnosis not present

## 2015-08-23 DIAGNOSIS — G40909 Epilepsy, unspecified, not intractable, without status epilepticus: Secondary | ICD-10-CM | POA: Diagnosis not present

## 2015-08-23 DIAGNOSIS — R55 Syncope and collapse: Secondary | ICD-10-CM | POA: Diagnosis not present

## 2015-08-23 DIAGNOSIS — R531 Weakness: Secondary | ICD-10-CM | POA: Diagnosis not present

## 2015-08-23 DIAGNOSIS — R0602 Shortness of breath: Secondary | ICD-10-CM | POA: Diagnosis not present

## 2015-08-23 DIAGNOSIS — J45909 Unspecified asthma, uncomplicated: Secondary | ICD-10-CM | POA: Diagnosis not present

## 2015-09-03 DIAGNOSIS — F329 Major depressive disorder, single episode, unspecified: Secondary | ICD-10-CM | POA: Diagnosis not present

## 2015-09-03 DIAGNOSIS — S82401D Unspecified fracture of shaft of right fibula, subsequent encounter for closed fracture with routine healing: Secondary | ICD-10-CM | POA: Diagnosis not present

## 2015-09-03 DIAGNOSIS — M150 Primary generalized (osteo)arthritis: Secondary | ICD-10-CM | POA: Diagnosis not present

## 2015-09-03 DIAGNOSIS — J449 Chronic obstructive pulmonary disease, unspecified: Secondary | ICD-10-CM | POA: Diagnosis not present

## 2015-09-14 DIAGNOSIS — I480 Paroxysmal atrial fibrillation: Secondary | ICD-10-CM | POA: Diagnosis not present

## 2015-09-14 DIAGNOSIS — E039 Hypothyroidism, unspecified: Secondary | ICD-10-CM | POA: Diagnosis not present

## 2015-09-14 DIAGNOSIS — A047 Enterocolitis due to Clostridium difficile: Secondary | ICD-10-CM | POA: Diagnosis not present

## 2015-09-14 DIAGNOSIS — E782 Mixed hyperlipidemia: Secondary | ICD-10-CM | POA: Diagnosis not present

## 2015-09-16 DIAGNOSIS — R55 Syncope and collapse: Secondary | ICD-10-CM | POA: Diagnosis not present

## 2015-09-16 DIAGNOSIS — I471 Supraventricular tachycardia: Secondary | ICD-10-CM | POA: Diagnosis not present

## 2015-09-16 DIAGNOSIS — Z4509 Encounter for adjustment and management of other cardiac device: Secondary | ICD-10-CM | POA: Diagnosis not present

## 2015-10-04 DIAGNOSIS — M810 Age-related osteoporosis without current pathological fracture: Secondary | ICD-10-CM | POA: Diagnosis not present

## 2015-10-04 DIAGNOSIS — I69392 Facial weakness following cerebral infarction: Secondary | ICD-10-CM | POA: Diagnosis not present

## 2015-10-04 DIAGNOSIS — J449 Chronic obstructive pulmonary disease, unspecified: Secondary | ICD-10-CM | POA: Diagnosis not present

## 2015-10-04 DIAGNOSIS — I480 Paroxysmal atrial fibrillation: Secondary | ICD-10-CM | POA: Diagnosis not present

## 2015-10-04 DIAGNOSIS — I69351 Hemiplegia and hemiparesis following cerebral infarction affecting right dominant side: Secondary | ICD-10-CM | POA: Diagnosis not present

## 2015-10-07 DIAGNOSIS — J449 Chronic obstructive pulmonary disease, unspecified: Secondary | ICD-10-CM | POA: Diagnosis not present

## 2015-10-07 DIAGNOSIS — I480 Paroxysmal atrial fibrillation: Secondary | ICD-10-CM | POA: Diagnosis not present

## 2015-10-07 DIAGNOSIS — I69351 Hemiplegia and hemiparesis following cerebral infarction affecting right dominant side: Secondary | ICD-10-CM | POA: Diagnosis not present

## 2015-10-07 DIAGNOSIS — M810 Age-related osteoporosis without current pathological fracture: Secondary | ICD-10-CM | POA: Diagnosis not present

## 2015-10-07 DIAGNOSIS — I69392 Facial weakness following cerebral infarction: Secondary | ICD-10-CM | POA: Diagnosis not present

## 2015-10-11 DIAGNOSIS — M810 Age-related osteoporosis without current pathological fracture: Secondary | ICD-10-CM | POA: Diagnosis not present

## 2015-10-11 DIAGNOSIS — J449 Chronic obstructive pulmonary disease, unspecified: Secondary | ICD-10-CM | POA: Diagnosis not present

## 2015-10-11 DIAGNOSIS — I480 Paroxysmal atrial fibrillation: Secondary | ICD-10-CM | POA: Diagnosis not present

## 2015-10-11 DIAGNOSIS — I69392 Facial weakness following cerebral infarction: Secondary | ICD-10-CM | POA: Diagnosis not present

## 2015-10-11 DIAGNOSIS — I69351 Hemiplegia and hemiparesis following cerebral infarction affecting right dominant side: Secondary | ICD-10-CM | POA: Diagnosis not present

## 2015-10-13 DIAGNOSIS — J449 Chronic obstructive pulmonary disease, unspecified: Secondary | ICD-10-CM | POA: Diagnosis not present

## 2015-10-13 DIAGNOSIS — M810 Age-related osteoporosis without current pathological fracture: Secondary | ICD-10-CM | POA: Diagnosis not present

## 2015-10-13 DIAGNOSIS — I480 Paroxysmal atrial fibrillation: Secondary | ICD-10-CM | POA: Diagnosis not present

## 2015-10-13 DIAGNOSIS — I69392 Facial weakness following cerebral infarction: Secondary | ICD-10-CM | POA: Diagnosis not present

## 2015-10-13 DIAGNOSIS — I69351 Hemiplegia and hemiparesis following cerebral infarction affecting right dominant side: Secondary | ICD-10-CM | POA: Diagnosis not present

## 2015-10-15 DIAGNOSIS — I480 Paroxysmal atrial fibrillation: Secondary | ICD-10-CM | POA: Diagnosis not present

## 2015-10-15 DIAGNOSIS — I69351 Hemiplegia and hemiparesis following cerebral infarction affecting right dominant side: Secondary | ICD-10-CM | POA: Diagnosis not present

## 2015-10-15 DIAGNOSIS — J449 Chronic obstructive pulmonary disease, unspecified: Secondary | ICD-10-CM | POA: Diagnosis not present

## 2015-10-15 DIAGNOSIS — M810 Age-related osteoporosis without current pathological fracture: Secondary | ICD-10-CM | POA: Diagnosis not present

## 2015-10-15 DIAGNOSIS — I69392 Facial weakness following cerebral infarction: Secondary | ICD-10-CM | POA: Diagnosis not present

## 2015-10-19 DIAGNOSIS — I69392 Facial weakness following cerebral infarction: Secondary | ICD-10-CM | POA: Diagnosis not present

## 2015-10-19 DIAGNOSIS — I69351 Hemiplegia and hemiparesis following cerebral infarction affecting right dominant side: Secondary | ICD-10-CM | POA: Diagnosis not present

## 2015-10-19 DIAGNOSIS — I480 Paroxysmal atrial fibrillation: Secondary | ICD-10-CM | POA: Diagnosis not present

## 2015-10-19 DIAGNOSIS — M810 Age-related osteoporosis without current pathological fracture: Secondary | ICD-10-CM | POA: Diagnosis not present

## 2015-10-19 DIAGNOSIS — J449 Chronic obstructive pulmonary disease, unspecified: Secondary | ICD-10-CM | POA: Diagnosis not present

## 2015-10-20 DIAGNOSIS — I69351 Hemiplegia and hemiparesis following cerebral infarction affecting right dominant side: Secondary | ICD-10-CM | POA: Diagnosis not present

## 2015-10-20 DIAGNOSIS — I480 Paroxysmal atrial fibrillation: Secondary | ICD-10-CM | POA: Diagnosis not present

## 2015-10-20 DIAGNOSIS — M810 Age-related osteoporosis without current pathological fracture: Secondary | ICD-10-CM | POA: Diagnosis not present

## 2015-10-20 DIAGNOSIS — J449 Chronic obstructive pulmonary disease, unspecified: Secondary | ICD-10-CM | POA: Diagnosis not present

## 2015-10-20 DIAGNOSIS — I69392 Facial weakness following cerebral infarction: Secondary | ICD-10-CM | POA: Diagnosis not present

## 2015-10-24 DIAGNOSIS — F329 Major depressive disorder, single episode, unspecified: Secondary | ICD-10-CM | POA: Diagnosis not present

## 2015-10-24 DIAGNOSIS — R350 Frequency of micturition: Secondary | ICD-10-CM | POA: Diagnosis not present

## 2015-10-24 DIAGNOSIS — R55 Syncope and collapse: Secondary | ICD-10-CM | POA: Diagnosis not present

## 2015-10-24 DIAGNOSIS — S8252XD Displaced fracture of medial malleolus of left tibia, subsequent encounter for closed fracture with routine healing: Secondary | ICD-10-CM | POA: Diagnosis not present

## 2015-10-24 DIAGNOSIS — R938 Abnormal findings on diagnostic imaging of other specified body structures: Secondary | ICD-10-CM | POA: Diagnosis not present

## 2015-10-24 DIAGNOSIS — S82892A Other fracture of left lower leg, initial encounter for closed fracture: Secondary | ICD-10-CM | POA: Diagnosis not present

## 2015-10-24 DIAGNOSIS — J449 Chronic obstructive pulmonary disease, unspecified: Secondary | ICD-10-CM | POA: Diagnosis not present

## 2015-10-24 DIAGNOSIS — I951 Orthostatic hypotension: Secondary | ICD-10-CM | POA: Diagnosis not present

## 2015-10-24 DIAGNOSIS — R0602 Shortness of breath: Secondary | ICD-10-CM | POA: Diagnosis not present

## 2015-10-24 DIAGNOSIS — S82842A Displaced bimalleolar fracture of left lower leg, initial encounter for closed fracture: Secondary | ICD-10-CM | POA: Diagnosis not present

## 2015-10-24 DIAGNOSIS — G40909 Epilepsy, unspecified, not intractable, without status epilepticus: Secondary | ICD-10-CM | POA: Diagnosis not present

## 2015-10-24 DIAGNOSIS — M25572 Pain in left ankle and joints of left foot: Secondary | ICD-10-CM | POA: Diagnosis not present

## 2015-10-24 DIAGNOSIS — R197 Diarrhea, unspecified: Secondary | ICD-10-CM | POA: Diagnosis not present

## 2015-10-24 DIAGNOSIS — S8252XA Displaced fracture of medial malleolus of left tibia, initial encounter for closed fracture: Secondary | ICD-10-CM | POA: Diagnosis not present

## 2015-10-24 DIAGNOSIS — I480 Paroxysmal atrial fibrillation: Secondary | ICD-10-CM | POA: Diagnosis not present

## 2015-10-24 DIAGNOSIS — E039 Hypothyroidism, unspecified: Secondary | ICD-10-CM | POA: Diagnosis not present

## 2015-10-24 DIAGNOSIS — S82202D Unspecified fracture of shaft of left tibia, subsequent encounter for closed fracture with routine healing: Secondary | ICD-10-CM | POA: Diagnosis not present

## 2015-10-24 DIAGNOSIS — T148 Other injury of unspecified body region: Secondary | ICD-10-CM | POA: Diagnosis not present

## 2015-10-24 DIAGNOSIS — I4891 Unspecified atrial fibrillation: Secondary | ICD-10-CM | POA: Diagnosis not present

## 2015-10-24 DIAGNOSIS — A047 Enterocolitis due to Clostridium difficile: Secondary | ICD-10-CM | POA: Diagnosis not present

## 2015-10-29 DIAGNOSIS — I69351 Hemiplegia and hemiparesis following cerebral infarction affecting right dominant side: Secondary | ICD-10-CM | POA: Diagnosis not present

## 2015-10-29 DIAGNOSIS — J449 Chronic obstructive pulmonary disease, unspecified: Secondary | ICD-10-CM | POA: Diagnosis not present

## 2015-10-29 DIAGNOSIS — M810 Age-related osteoporosis without current pathological fracture: Secondary | ICD-10-CM | POA: Diagnosis not present

## 2015-10-29 DIAGNOSIS — I69392 Facial weakness following cerebral infarction: Secondary | ICD-10-CM | POA: Diagnosis not present

## 2015-10-29 DIAGNOSIS — I480 Paroxysmal atrial fibrillation: Secondary | ICD-10-CM | POA: Diagnosis not present

## 2015-11-01 DIAGNOSIS — M810 Age-related osteoporosis without current pathological fracture: Secondary | ICD-10-CM | POA: Diagnosis not present

## 2015-11-01 DIAGNOSIS — J449 Chronic obstructive pulmonary disease, unspecified: Secondary | ICD-10-CM | POA: Diagnosis not present

## 2015-11-01 DIAGNOSIS — I69392 Facial weakness following cerebral infarction: Secondary | ICD-10-CM | POA: Diagnosis not present

## 2015-11-01 DIAGNOSIS — I480 Paroxysmal atrial fibrillation: Secondary | ICD-10-CM | POA: Diagnosis not present

## 2015-11-01 DIAGNOSIS — I69351 Hemiplegia and hemiparesis following cerebral infarction affecting right dominant side: Secondary | ICD-10-CM | POA: Diagnosis not present

## 2015-11-04 DIAGNOSIS — J449 Chronic obstructive pulmonary disease, unspecified: Secondary | ICD-10-CM | POA: Diagnosis not present

## 2015-11-04 DIAGNOSIS — I480 Paroxysmal atrial fibrillation: Secondary | ICD-10-CM | POA: Diagnosis not present

## 2015-11-04 DIAGNOSIS — I69351 Hemiplegia and hemiparesis following cerebral infarction affecting right dominant side: Secondary | ICD-10-CM | POA: Diagnosis not present

## 2015-11-04 DIAGNOSIS — I69392 Facial weakness following cerebral infarction: Secondary | ICD-10-CM | POA: Diagnosis not present

## 2015-11-04 DIAGNOSIS — M810 Age-related osteoporosis without current pathological fracture: Secondary | ICD-10-CM | POA: Diagnosis not present

## 2015-11-05 DIAGNOSIS — I69351 Hemiplegia and hemiparesis following cerebral infarction affecting right dominant side: Secondary | ICD-10-CM | POA: Diagnosis not present

## 2015-11-05 DIAGNOSIS — M810 Age-related osteoporosis without current pathological fracture: Secondary | ICD-10-CM | POA: Diagnosis not present

## 2015-11-05 DIAGNOSIS — I69392 Facial weakness following cerebral infarction: Secondary | ICD-10-CM | POA: Diagnosis not present

## 2015-11-05 DIAGNOSIS — J449 Chronic obstructive pulmonary disease, unspecified: Secondary | ICD-10-CM | POA: Diagnosis not present

## 2015-11-05 DIAGNOSIS — I480 Paroxysmal atrial fibrillation: Secondary | ICD-10-CM | POA: Diagnosis not present

## 2015-11-08 DIAGNOSIS — I480 Paroxysmal atrial fibrillation: Secondary | ICD-10-CM | POA: Diagnosis not present

## 2015-11-08 DIAGNOSIS — I69351 Hemiplegia and hemiparesis following cerebral infarction affecting right dominant side: Secondary | ICD-10-CM | POA: Diagnosis not present

## 2015-11-08 DIAGNOSIS — I69392 Facial weakness following cerebral infarction: Secondary | ICD-10-CM | POA: Diagnosis not present

## 2015-11-08 DIAGNOSIS — M810 Age-related osteoporosis without current pathological fracture: Secondary | ICD-10-CM | POA: Diagnosis not present

## 2015-11-08 DIAGNOSIS — J449 Chronic obstructive pulmonary disease, unspecified: Secondary | ICD-10-CM | POA: Diagnosis not present

## 2015-11-09 DIAGNOSIS — S82892A Other fracture of left lower leg, initial encounter for closed fracture: Secondary | ICD-10-CM | POA: Diagnosis not present

## 2015-11-09 DIAGNOSIS — Z09 Encounter for follow-up examination after completed treatment for conditions other than malignant neoplasm: Secondary | ICD-10-CM | POA: Diagnosis not present

## 2015-11-09 DIAGNOSIS — A047 Enterocolitis due to Clostridium difficile: Secondary | ICD-10-CM | POA: Diagnosis not present

## 2015-11-10 DIAGNOSIS — I69392 Facial weakness following cerebral infarction: Secondary | ICD-10-CM | POA: Diagnosis not present

## 2015-11-10 DIAGNOSIS — I480 Paroxysmal atrial fibrillation: Secondary | ICD-10-CM | POA: Diagnosis not present

## 2015-11-10 DIAGNOSIS — I69351 Hemiplegia and hemiparesis following cerebral infarction affecting right dominant side: Secondary | ICD-10-CM | POA: Diagnosis not present

## 2015-11-10 DIAGNOSIS — M810 Age-related osteoporosis without current pathological fracture: Secondary | ICD-10-CM | POA: Diagnosis not present

## 2015-11-10 DIAGNOSIS — J449 Chronic obstructive pulmonary disease, unspecified: Secondary | ICD-10-CM | POA: Diagnosis not present

## 2015-11-12 DIAGNOSIS — I69392 Facial weakness following cerebral infarction: Secondary | ICD-10-CM | POA: Diagnosis not present

## 2015-11-12 DIAGNOSIS — J449 Chronic obstructive pulmonary disease, unspecified: Secondary | ICD-10-CM | POA: Diagnosis not present

## 2015-11-12 DIAGNOSIS — I480 Paroxysmal atrial fibrillation: Secondary | ICD-10-CM | POA: Diagnosis not present

## 2015-11-12 DIAGNOSIS — M810 Age-related osteoporosis without current pathological fracture: Secondary | ICD-10-CM | POA: Diagnosis not present

## 2015-11-12 DIAGNOSIS — I69351 Hemiplegia and hemiparesis following cerebral infarction affecting right dominant side: Secondary | ICD-10-CM | POA: Diagnosis not present

## 2015-11-15 DIAGNOSIS — I480 Paroxysmal atrial fibrillation: Secondary | ICD-10-CM | POA: Diagnosis not present

## 2015-11-15 DIAGNOSIS — M810 Age-related osteoporosis without current pathological fracture: Secondary | ICD-10-CM | POA: Diagnosis not present

## 2015-11-15 DIAGNOSIS — J449 Chronic obstructive pulmonary disease, unspecified: Secondary | ICD-10-CM | POA: Diagnosis not present

## 2015-11-15 DIAGNOSIS — I69392 Facial weakness following cerebral infarction: Secondary | ICD-10-CM | POA: Diagnosis not present

## 2015-11-15 DIAGNOSIS — I69351 Hemiplegia and hemiparesis following cerebral infarction affecting right dominant side: Secondary | ICD-10-CM | POA: Diagnosis not present

## 2015-11-17 DIAGNOSIS — I480 Paroxysmal atrial fibrillation: Secondary | ICD-10-CM | POA: Diagnosis not present

## 2015-11-17 DIAGNOSIS — I69351 Hemiplegia and hemiparesis following cerebral infarction affecting right dominant side: Secondary | ICD-10-CM | POA: Diagnosis not present

## 2015-11-17 DIAGNOSIS — I69392 Facial weakness following cerebral infarction: Secondary | ICD-10-CM | POA: Diagnosis not present

## 2015-11-17 DIAGNOSIS — J449 Chronic obstructive pulmonary disease, unspecified: Secondary | ICD-10-CM | POA: Diagnosis not present

## 2015-11-17 DIAGNOSIS — Z8781 Personal history of (healed) traumatic fracture: Secondary | ICD-10-CM | POA: Diagnosis not present

## 2015-11-17 DIAGNOSIS — M25572 Pain in left ankle and joints of left foot: Secondary | ICD-10-CM | POA: Diagnosis not present

## 2015-11-17 DIAGNOSIS — Z967 Presence of other bone and tendon implants: Secondary | ICD-10-CM | POA: Diagnosis not present

## 2015-11-17 DIAGNOSIS — M810 Age-related osteoporosis without current pathological fracture: Secondary | ICD-10-CM | POA: Diagnosis not present

## 2015-11-18 ENCOUNTER — Other Ambulatory Visit: Payer: Self-pay

## 2015-11-18 NOTE — Patient Outreach (Signed)
[place] [facility]) Care Management  11/18/2015  Lindsay Mills 1945/10/28 [id number]   REFERRAL SOURCE; [facility] referral / [facility] Medicare HMO [place];  Per referral note: 4 recent hospital admits in the past 6 months. Past medical history of CVA 2015, Afib status post ablation, COPD, chronic orthostatic hypertension, seizure, Cdiff.  Patient could benefit from health education related to her heart.  Telephone call to patient regarding [facility] referral.  Unable to reach. HIPAA compliant voice message left with call back phone number.   Quinn Plowman RN,BSN,CCM [facility] Telephonic  [phone]

## 2015-11-19 DIAGNOSIS — M810 Age-related osteoporosis without current pathological fracture: Secondary | ICD-10-CM | POA: Diagnosis not present

## 2015-11-19 DIAGNOSIS — I480 Paroxysmal atrial fibrillation: Secondary | ICD-10-CM | POA: Diagnosis not present

## 2015-11-19 DIAGNOSIS — I69351 Hemiplegia and hemiparesis following cerebral infarction affecting right dominant side: Secondary | ICD-10-CM | POA: Diagnosis not present

## 2015-11-19 DIAGNOSIS — J449 Chronic obstructive pulmonary disease, unspecified: Secondary | ICD-10-CM | POA: Diagnosis not present

## 2015-11-19 DIAGNOSIS — I69392 Facial weakness following cerebral infarction: Secondary | ICD-10-CM | POA: Diagnosis not present

## 2015-11-22 DIAGNOSIS — I69351 Hemiplegia and hemiparesis following cerebral infarction affecting right dominant side: Secondary | ICD-10-CM | POA: Diagnosis not present

## 2015-11-22 DIAGNOSIS — I69392 Facial weakness following cerebral infarction: Secondary | ICD-10-CM | POA: Diagnosis not present

## 2015-11-22 DIAGNOSIS — J449 Chronic obstructive pulmonary disease, unspecified: Secondary | ICD-10-CM | POA: Diagnosis not present

## 2015-11-22 DIAGNOSIS — I480 Paroxysmal atrial fibrillation: Secondary | ICD-10-CM | POA: Diagnosis not present

## 2015-11-22 DIAGNOSIS — M810 Age-related osteoporosis without current pathological fracture: Secondary | ICD-10-CM | POA: Diagnosis not present

## 2015-11-24 ENCOUNTER — Other Ambulatory Visit: Payer: Self-pay

## 2015-11-24 DIAGNOSIS — Z8781 Personal history of (healed) traumatic fracture: Secondary | ICD-10-CM

## 2015-11-24 DIAGNOSIS — Z79899 Other long term (current) drug therapy: Secondary | ICD-10-CM

## 2015-11-24 DIAGNOSIS — R69 Illness, unspecified: Secondary | ICD-10-CM

## 2015-11-24 DIAGNOSIS — Z9889 Other specified postprocedural states: Secondary | ICD-10-CM

## 2015-11-24 DIAGNOSIS — J449 Chronic obstructive pulmonary disease, unspecified: Secondary | ICD-10-CM | POA: Diagnosis not present

## 2015-11-24 DIAGNOSIS — I480 Paroxysmal atrial fibrillation: Secondary | ICD-10-CM | POA: Diagnosis not present

## 2015-11-24 DIAGNOSIS — M810 Age-related osteoporosis without current pathological fracture: Secondary | ICD-10-CM | POA: Diagnosis not present

## 2015-11-24 DIAGNOSIS — I69392 Facial weakness following cerebral infarction: Secondary | ICD-10-CM | POA: Diagnosis not present

## 2015-11-24 DIAGNOSIS — I69351 Hemiplegia and hemiparesis following cerebral infarction affecting right dominant side: Secondary | ICD-10-CM | POA: Diagnosis not present

## 2015-11-24 NOTE — Patient Outreach (Signed)
[facility]) Care Management  11/24/2015  KRYSTALLE HANDRICK [birth year] [id number]   REFERRAL DATE; 11/18/15 REFERRAL SOURCE:  [facility] referral REFERRAL REASON;  4 recent admits within the past 6 months.  Date of discharge 10/28/15 open reduction internal fixation of left ankle.  Past medical history of CVA 2015, Atrial fibrillation status post ablation  SUBJECTIVE:  Telephone call to patient regarding [facility] referral.  HIPAA verified with patient. Discussed and offered [facility] care management services to patient. Patient verbally agreed to receive transition of care follow up by phone only.   Patient states she recently was in the hospital due to a fractured left ankle. Patient states she is currently non weight bearing on her left foot/ankle. Patient states she uses a travel chair to maneuver around in her home and tries to keep her left ankle elevated as much as possible.   Patient states she is currently receiving home health physical therapy and home health aid assistance from [facility].  Patient states she was just standing in the kitchen and her left ankle broke. Patient denies any falls.  Patient states she had scoliosis surgery approximately 10 years ago.  Patient states at that time the doctor found that half of her lung on the right was not functioning well.  Patient states she has not had to have treatment for this.  Patient denies that she has COPD.  Patient states she has atrial fib. Patient states she had a procedure done in May 2017 by a [place] cardiologist. Patient unable to name specific procedure. Patient states due to this procedure she is not having the symptoms of fainting, dizziness, or lightheadedness that she use to.  Patient states she has a follow up appointment with her cardiologist next week.  Patient states she is able to afford her medications and is not having any side effects or questions regarding her medications.  Patient states she does not  have any needs concerning her medications at this time.  Patient again verbalized consent to have [facility] care management nurse case manager contact her with transition of care follow up calls. Patient request calls only.    ASSESSMENT; [facility] referral.  Patient is status post ORIF left ankle discharged on 10/28/15.    PLAN; RNCM will refer patient to community case manager for transition of care.   Quinn Plowman RN,BSN,CCM [facility] Telephonic  [phone]

## 2015-11-26 ENCOUNTER — Other Ambulatory Visit: Payer: Self-pay | Admitting: *Deleted

## 2015-11-26 ENCOUNTER — Encounter: Payer: Self-pay | Admitting: *Deleted

## 2015-11-26 NOTE — Patient Outreach (Addendum)
[facility]) Care Management  11/26/2015  Lindsay Mills [birth year] [id number]   RN spoke with pt and introduced the [facility] program and services. Pt very receptive to the call and indicated her medical condition noted below:  -History of a minor stroke with residual issues to her right hand with some numbness but she is able to use with no reported problems. -History of elevated BP which has now improved after several cardiac ablations. -Currently with a cast on and unable to take showers for the next 3 weeks when the cast will be removed at that time.   Pt confirms she continues to receive [facility] PT three times a week and "feels better". RN verified pt continues to take all her prescribed medications with no delays and has sufficient transportation to all her medical appointments with no problems (supportive spouse). RN offered a home visit to address any of his medical conditions and possible completed a home evaluation. Although pt was very appreciative she opt to decline [facility] services at this time indicating she is doing "quiet well" with no needs that needed to be addressed at this time. RN discussed possible community resources such as [employer] and [facility] however if additional resources are needed pt provided contact via [facility] and encouraged pt to call and inquire further. Pt are RN will alert her primary that she has decline [facility] services at this time. No further request or inquires at this time.  Case will be closed.  Raina Mina, RN Care Management Coordinator [place] Office [phone]

## 2015-11-27 DIAGNOSIS — I480 Paroxysmal atrial fibrillation: Secondary | ICD-10-CM | POA: Diagnosis not present

## 2015-11-27 DIAGNOSIS — J449 Chronic obstructive pulmonary disease, unspecified: Secondary | ICD-10-CM | POA: Diagnosis not present

## 2015-11-27 DIAGNOSIS — I69392 Facial weakness following cerebral infarction: Secondary | ICD-10-CM | POA: Diagnosis not present

## 2015-11-27 DIAGNOSIS — I69351 Hemiplegia and hemiparesis following cerebral infarction affecting right dominant side: Secondary | ICD-10-CM | POA: Diagnosis not present

## 2015-11-27 DIAGNOSIS — M810 Age-related osteoporosis without current pathological fracture: Secondary | ICD-10-CM | POA: Diagnosis not present

## 2015-11-28 DIAGNOSIS — I951 Orthostatic hypotension: Secondary | ICD-10-CM | POA: Diagnosis not present

## 2015-11-28 DIAGNOSIS — S82892A Other fracture of left lower leg, initial encounter for closed fracture: Secondary | ICD-10-CM | POA: Diagnosis not present

## 2015-11-29 DIAGNOSIS — I951 Orthostatic hypotension: Secondary | ICD-10-CM | POA: Diagnosis not present

## 2015-11-29 DIAGNOSIS — I480 Paroxysmal atrial fibrillation: Secondary | ICD-10-CM | POA: Diagnosis not present

## 2015-11-29 DIAGNOSIS — I495 Sick sinus syndrome: Secondary | ICD-10-CM | POA: Diagnosis not present

## 2015-11-29 DIAGNOSIS — I471 Supraventricular tachycardia: Secondary | ICD-10-CM | POA: Diagnosis not present

## 2015-11-29 DIAGNOSIS — R001 Bradycardia, unspecified: Secondary | ICD-10-CM | POA: Diagnosis not present

## 2015-11-30 DIAGNOSIS — M810 Age-related osteoporosis without current pathological fracture: Secondary | ICD-10-CM | POA: Diagnosis not present

## 2015-11-30 DIAGNOSIS — Z959 Presence of cardiac and vascular implant and graft, unspecified: Secondary | ICD-10-CM | POA: Diagnosis not present

## 2015-11-30 DIAGNOSIS — I69392 Facial weakness following cerebral infarction: Secondary | ICD-10-CM | POA: Diagnosis not present

## 2015-11-30 DIAGNOSIS — I4891 Unspecified atrial fibrillation: Secondary | ICD-10-CM | POA: Diagnosis not present

## 2015-11-30 DIAGNOSIS — I69351 Hemiplegia and hemiparesis following cerebral infarction affecting right dominant side: Secondary | ICD-10-CM | POA: Diagnosis not present

## 2015-11-30 DIAGNOSIS — R55 Syncope and collapse: Secondary | ICD-10-CM | POA: Diagnosis not present

## 2015-11-30 DIAGNOSIS — J449 Chronic obstructive pulmonary disease, unspecified: Secondary | ICD-10-CM | POA: Diagnosis not present

## 2015-11-30 DIAGNOSIS — I480 Paroxysmal atrial fibrillation: Secondary | ICD-10-CM | POA: Diagnosis not present

## 2015-12-06 DIAGNOSIS — I480 Paroxysmal atrial fibrillation: Secondary | ICD-10-CM | POA: Diagnosis not present

## 2015-12-06 DIAGNOSIS — I69351 Hemiplegia and hemiparesis following cerebral infarction affecting right dominant side: Secondary | ICD-10-CM | POA: Diagnosis not present

## 2015-12-06 DIAGNOSIS — M810 Age-related osteoporosis without current pathological fracture: Secondary | ICD-10-CM | POA: Diagnosis not present

## 2015-12-06 DIAGNOSIS — J449 Chronic obstructive pulmonary disease, unspecified: Secondary | ICD-10-CM | POA: Diagnosis not present

## 2015-12-06 DIAGNOSIS — I69392 Facial weakness following cerebral infarction: Secondary | ICD-10-CM | POA: Diagnosis not present

## 2015-12-07 DIAGNOSIS — I69392 Facial weakness following cerebral infarction: Secondary | ICD-10-CM | POA: Diagnosis not present

## 2015-12-07 DIAGNOSIS — M810 Age-related osteoporosis without current pathological fracture: Secondary | ICD-10-CM | POA: Diagnosis not present

## 2015-12-07 DIAGNOSIS — I69351 Hemiplegia and hemiparesis following cerebral infarction affecting right dominant side: Secondary | ICD-10-CM | POA: Diagnosis not present

## 2015-12-07 DIAGNOSIS — J449 Chronic obstructive pulmonary disease, unspecified: Secondary | ICD-10-CM | POA: Diagnosis not present

## 2015-12-07 DIAGNOSIS — I480 Paroxysmal atrial fibrillation: Secondary | ICD-10-CM | POA: Diagnosis not present

## 2015-12-09 DIAGNOSIS — I69351 Hemiplegia and hemiparesis following cerebral infarction affecting right dominant side: Secondary | ICD-10-CM | POA: Diagnosis not present

## 2015-12-09 DIAGNOSIS — J449 Chronic obstructive pulmonary disease, unspecified: Secondary | ICD-10-CM | POA: Diagnosis not present

## 2015-12-09 DIAGNOSIS — I69392 Facial weakness following cerebral infarction: Secondary | ICD-10-CM | POA: Diagnosis not present

## 2015-12-09 DIAGNOSIS — M810 Age-related osteoporosis without current pathological fracture: Secondary | ICD-10-CM | POA: Diagnosis not present

## 2015-12-09 DIAGNOSIS — I480 Paroxysmal atrial fibrillation: Secondary | ICD-10-CM | POA: Diagnosis not present

## 2015-12-14 ENCOUNTER — Encounter: Payer: Self-pay | Admitting: Pharmacist

## 2015-12-14 DIAGNOSIS — J449 Chronic obstructive pulmonary disease, unspecified: Secondary | ICD-10-CM | POA: Diagnosis not present

## 2015-12-14 DIAGNOSIS — I69392 Facial weakness following cerebral infarction: Secondary | ICD-10-CM | POA: Diagnosis not present

## 2015-12-14 DIAGNOSIS — M810 Age-related osteoporosis without current pathological fracture: Secondary | ICD-10-CM | POA: Diagnosis not present

## 2015-12-14 DIAGNOSIS — I480 Paroxysmal atrial fibrillation: Secondary | ICD-10-CM | POA: Diagnosis not present

## 2015-12-14 DIAGNOSIS — I69351 Hemiplegia and hemiparesis following cerebral infarction affecting right dominant side: Secondary | ICD-10-CM | POA: Diagnosis not present

## 2015-12-16 DIAGNOSIS — M25572 Pain in left ankle and joints of left foot: Secondary | ICD-10-CM | POA: Diagnosis not present

## 2015-12-17 DIAGNOSIS — I69392 Facial weakness following cerebral infarction: Secondary | ICD-10-CM | POA: Diagnosis not present

## 2015-12-17 DIAGNOSIS — M810 Age-related osteoporosis without current pathological fracture: Secondary | ICD-10-CM | POA: Diagnosis not present

## 2015-12-17 DIAGNOSIS — J449 Chronic obstructive pulmonary disease, unspecified: Secondary | ICD-10-CM | POA: Diagnosis not present

## 2015-12-17 DIAGNOSIS — I480 Paroxysmal atrial fibrillation: Secondary | ICD-10-CM | POA: Diagnosis not present

## 2015-12-17 DIAGNOSIS — I69351 Hemiplegia and hemiparesis following cerebral infarction affecting right dominant side: Secondary | ICD-10-CM | POA: Diagnosis not present

## 2015-12-21 DIAGNOSIS — E039 Hypothyroidism, unspecified: Secondary | ICD-10-CM | POA: Diagnosis not present

## 2015-12-21 DIAGNOSIS — I480 Paroxysmal atrial fibrillation: Secondary | ICD-10-CM | POA: Diagnosis not present

## 2015-12-21 DIAGNOSIS — E782 Mixed hyperlipidemia: Secondary | ICD-10-CM | POA: Diagnosis not present

## 2015-12-22 DIAGNOSIS — M810 Age-related osteoporosis without current pathological fracture: Secondary | ICD-10-CM | POA: Diagnosis not present

## 2015-12-22 DIAGNOSIS — I480 Paroxysmal atrial fibrillation: Secondary | ICD-10-CM | POA: Diagnosis not present

## 2015-12-22 DIAGNOSIS — I69392 Facial weakness following cerebral infarction: Secondary | ICD-10-CM | POA: Diagnosis not present

## 2015-12-22 DIAGNOSIS — J449 Chronic obstructive pulmonary disease, unspecified: Secondary | ICD-10-CM | POA: Diagnosis not present

## 2015-12-22 DIAGNOSIS — I69351 Hemiplegia and hemiparesis following cerebral infarction affecting right dominant side: Secondary | ICD-10-CM | POA: Diagnosis not present

## 2015-12-27 DIAGNOSIS — J449 Chronic obstructive pulmonary disease, unspecified: Secondary | ICD-10-CM | POA: Diagnosis not present

## 2015-12-27 DIAGNOSIS — I480 Paroxysmal atrial fibrillation: Secondary | ICD-10-CM | POA: Diagnosis not present

## 2015-12-27 DIAGNOSIS — I69351 Hemiplegia and hemiparesis following cerebral infarction affecting right dominant side: Secondary | ICD-10-CM | POA: Diagnosis not present

## 2015-12-27 DIAGNOSIS — I69392 Facial weakness following cerebral infarction: Secondary | ICD-10-CM | POA: Diagnosis not present

## 2015-12-27 DIAGNOSIS — M810 Age-related osteoporosis without current pathological fracture: Secondary | ICD-10-CM | POA: Diagnosis not present

## 2015-12-28 DIAGNOSIS — E782 Mixed hyperlipidemia: Secondary | ICD-10-CM | POA: Diagnosis not present

## 2015-12-28 DIAGNOSIS — E039 Hypothyroidism, unspecified: Secondary | ICD-10-CM | POA: Diagnosis not present

## 2015-12-28 DIAGNOSIS — J449 Chronic obstructive pulmonary disease, unspecified: Secondary | ICD-10-CM | POA: Diagnosis not present

## 2015-12-28 DIAGNOSIS — M810 Age-related osteoporosis without current pathological fracture: Secondary | ICD-10-CM | POA: Diagnosis not present

## 2015-12-28 DIAGNOSIS — Z23 Encounter for immunization: Secondary | ICD-10-CM | POA: Diagnosis not present

## 2015-12-29 DIAGNOSIS — S82892A Other fracture of left lower leg, initial encounter for closed fracture: Secondary | ICD-10-CM | POA: Diagnosis not present

## 2015-12-29 DIAGNOSIS — I951 Orthostatic hypotension: Secondary | ICD-10-CM | POA: Diagnosis not present

## 2016-01-05 DIAGNOSIS — M810 Age-related osteoporosis without current pathological fracture: Secondary | ICD-10-CM | POA: Diagnosis not present

## 2016-01-05 DIAGNOSIS — I480 Paroxysmal atrial fibrillation: Secondary | ICD-10-CM | POA: Diagnosis not present

## 2016-01-05 DIAGNOSIS — I69351 Hemiplegia and hemiparesis following cerebral infarction affecting right dominant side: Secondary | ICD-10-CM | POA: Diagnosis not present

## 2016-01-05 DIAGNOSIS — J449 Chronic obstructive pulmonary disease, unspecified: Secondary | ICD-10-CM | POA: Diagnosis not present

## 2016-01-05 DIAGNOSIS — I69392 Facial weakness following cerebral infarction: Secondary | ICD-10-CM | POA: Diagnosis not present

## 2016-01-13 DIAGNOSIS — M25572 Pain in left ankle and joints of left foot: Secondary | ICD-10-CM | POA: Diagnosis not present

## 2016-01-14 DIAGNOSIS — M810 Age-related osteoporosis without current pathological fracture: Secondary | ICD-10-CM | POA: Diagnosis not present

## 2016-01-14 DIAGNOSIS — I69392 Facial weakness following cerebral infarction: Secondary | ICD-10-CM | POA: Diagnosis not present

## 2016-01-14 DIAGNOSIS — I69351 Hemiplegia and hemiparesis following cerebral infarction affecting right dominant side: Secondary | ICD-10-CM | POA: Diagnosis not present

## 2016-01-14 DIAGNOSIS — I480 Paroxysmal atrial fibrillation: Secondary | ICD-10-CM | POA: Diagnosis not present

## 2016-01-14 DIAGNOSIS — J449 Chronic obstructive pulmonary disease, unspecified: Secondary | ICD-10-CM | POA: Diagnosis not present

## 2016-01-16 DIAGNOSIS — E876 Hypokalemia: Secondary | ICD-10-CM | POA: Diagnosis not present

## 2016-01-16 DIAGNOSIS — I480 Paroxysmal atrial fibrillation: Secondary | ICD-10-CM | POA: Diagnosis not present

## 2016-01-16 DIAGNOSIS — F329 Major depressive disorder, single episode, unspecified: Secondary | ICD-10-CM | POA: Diagnosis not present

## 2016-01-16 DIAGNOSIS — R32 Unspecified urinary incontinence: Secondary | ICD-10-CM | POA: Diagnosis not present

## 2016-01-16 DIAGNOSIS — R531 Weakness: Secondary | ICD-10-CM | POA: Diagnosis not present

## 2016-01-16 DIAGNOSIS — R404 Transient alteration of awareness: Secondary | ICD-10-CM | POA: Diagnosis not present

## 2016-01-16 DIAGNOSIS — E039 Hypothyroidism, unspecified: Secondary | ICD-10-CM | POA: Diagnosis not present

## 2016-01-16 DIAGNOSIS — R197 Diarrhea, unspecified: Secondary | ICD-10-CM | POA: Diagnosis not present

## 2016-01-16 DIAGNOSIS — R079 Chest pain, unspecified: Secondary | ICD-10-CM | POA: Diagnosis not present

## 2016-01-16 DIAGNOSIS — K921 Melena: Secondary | ICD-10-CM | POA: Diagnosis not present

## 2016-01-16 DIAGNOSIS — A0472 Enterocolitis due to Clostridium difficile, not specified as recurrent: Secondary | ICD-10-CM | POA: Diagnosis not present

## 2016-01-16 DIAGNOSIS — I951 Orthostatic hypotension: Secondary | ICD-10-CM | POA: Diagnosis not present

## 2016-01-16 DIAGNOSIS — I4891 Unspecified atrial fibrillation: Secondary | ICD-10-CM | POA: Diagnosis not present

## 2016-01-16 DIAGNOSIS — N281 Cyst of kidney, acquired: Secondary | ICD-10-CM | POA: Diagnosis not present

## 2016-01-16 DIAGNOSIS — A0471 Enterocolitis due to Clostridium difficile, recurrent: Secondary | ICD-10-CM | POA: Diagnosis not present

## 2016-01-16 DIAGNOSIS — G40909 Epilepsy, unspecified, not intractable, without status epilepticus: Secondary | ICD-10-CM | POA: Diagnosis not present

## 2016-01-16 DIAGNOSIS — I959 Hypotension, unspecified: Secondary | ICD-10-CM | POA: Diagnosis not present

## 2016-01-16 DIAGNOSIS — R938 Abnormal findings on diagnostic imaging of other specified body structures: Secondary | ICD-10-CM | POA: Diagnosis not present

## 2016-01-16 DIAGNOSIS — N200 Calculus of kidney: Secondary | ICD-10-CM | POA: Diagnosis not present

## 2016-01-16 DIAGNOSIS — K529 Noninfective gastroenteritis and colitis, unspecified: Secondary | ICD-10-CM | POA: Diagnosis not present

## 2016-01-16 DIAGNOSIS — S0990XA Unspecified injury of head, initial encounter: Secondary | ICD-10-CM | POA: Diagnosis not present

## 2016-01-25 DIAGNOSIS — I480 Paroxysmal atrial fibrillation: Secondary | ICD-10-CM | POA: Diagnosis not present

## 2016-01-25 DIAGNOSIS — I10 Essential (primary) hypertension: Secondary | ICD-10-CM | POA: Diagnosis not present

## 2016-01-25 DIAGNOSIS — J449 Chronic obstructive pulmonary disease, unspecified: Secondary | ICD-10-CM | POA: Diagnosis not present

## 2016-01-25 DIAGNOSIS — I69352 Hemiplegia and hemiparesis following cerebral infarction affecting left dominant side: Secondary | ICD-10-CM | POA: Diagnosis not present

## 2016-01-25 DIAGNOSIS — M1991 Primary osteoarthritis, unspecified site: Secondary | ICD-10-CM | POA: Diagnosis not present

## 2016-01-28 DIAGNOSIS — J449 Chronic obstructive pulmonary disease, unspecified: Secondary | ICD-10-CM | POA: Diagnosis not present

## 2016-01-28 DIAGNOSIS — M1991 Primary osteoarthritis, unspecified site: Secondary | ICD-10-CM | POA: Diagnosis not present

## 2016-01-28 DIAGNOSIS — I10 Essential (primary) hypertension: Secondary | ICD-10-CM | POA: Diagnosis not present

## 2016-01-28 DIAGNOSIS — I69352 Hemiplegia and hemiparesis following cerebral infarction affecting left dominant side: Secondary | ICD-10-CM | POA: Diagnosis not present

## 2016-01-28 DIAGNOSIS — I480 Paroxysmal atrial fibrillation: Secondary | ICD-10-CM | POA: Diagnosis not present

## 2016-01-28 DIAGNOSIS — I951 Orthostatic hypotension: Secondary | ICD-10-CM | POA: Diagnosis not present

## 2016-01-28 DIAGNOSIS — S82892A Other fracture of left lower leg, initial encounter for closed fracture: Secondary | ICD-10-CM | POA: Diagnosis not present

## 2016-02-01 DIAGNOSIS — N3001 Acute cystitis with hematuria: Secondary | ICD-10-CM | POA: Diagnosis not present

## 2016-02-01 DIAGNOSIS — E039 Hypothyroidism, unspecified: Secondary | ICD-10-CM | POA: Diagnosis not present

## 2016-02-01 DIAGNOSIS — J9811 Atelectasis: Secondary | ICD-10-CM | POA: Diagnosis not present

## 2016-02-01 DIAGNOSIS — I480 Paroxysmal atrial fibrillation: Secondary | ICD-10-CM | POA: Diagnosis not present

## 2016-02-01 DIAGNOSIS — R531 Weakness: Secondary | ICD-10-CM | POA: Diagnosis not present

## 2016-02-01 DIAGNOSIS — R5383 Other fatigue: Secondary | ICD-10-CM | POA: Diagnosis not present

## 2016-02-01 DIAGNOSIS — R404 Transient alteration of awareness: Secondary | ICD-10-CM | POA: Diagnosis not present

## 2016-02-01 DIAGNOSIS — N179 Acute kidney failure, unspecified: Secondary | ICD-10-CM | POA: Diagnosis not present

## 2016-02-01 DIAGNOSIS — R319 Hematuria, unspecified: Secondary | ICD-10-CM | POA: Diagnosis not present

## 2016-02-01 DIAGNOSIS — G40909 Epilepsy, unspecified, not intractable, without status epilepticus: Secondary | ICD-10-CM | POA: Diagnosis not present

## 2016-02-01 DIAGNOSIS — I951 Orthostatic hypotension: Secondary | ICD-10-CM | POA: Diagnosis not present

## 2016-02-01 DIAGNOSIS — I10 Essential (primary) hypertension: Secondary | ICD-10-CM | POA: Diagnosis not present

## 2016-02-01 DIAGNOSIS — M419 Scoliosis, unspecified: Secondary | ICD-10-CM | POA: Diagnosis not present

## 2016-02-01 DIAGNOSIS — R05 Cough: Secondary | ICD-10-CM | POA: Diagnosis not present

## 2016-02-01 DIAGNOSIS — E876 Hypokalemia: Secondary | ICD-10-CM | POA: Diagnosis not present

## 2016-02-08 DIAGNOSIS — I69352 Hemiplegia and hemiparesis following cerebral infarction affecting left dominant side: Secondary | ICD-10-CM | POA: Diagnosis not present

## 2016-02-08 DIAGNOSIS — M1991 Primary osteoarthritis, unspecified site: Secondary | ICD-10-CM | POA: Diagnosis not present

## 2016-02-08 DIAGNOSIS — I480 Paroxysmal atrial fibrillation: Secondary | ICD-10-CM | POA: Diagnosis not present

## 2016-02-08 DIAGNOSIS — J449 Chronic obstructive pulmonary disease, unspecified: Secondary | ICD-10-CM | POA: Diagnosis not present

## 2016-02-08 DIAGNOSIS — I10 Essential (primary) hypertension: Secondary | ICD-10-CM | POA: Diagnosis not present

## 2016-02-09 DIAGNOSIS — I10 Essential (primary) hypertension: Secondary | ICD-10-CM | POA: Diagnosis not present

## 2016-02-09 DIAGNOSIS — I480 Paroxysmal atrial fibrillation: Secondary | ICD-10-CM | POA: Diagnosis not present

## 2016-02-09 DIAGNOSIS — J449 Chronic obstructive pulmonary disease, unspecified: Secondary | ICD-10-CM | POA: Diagnosis not present

## 2016-02-09 DIAGNOSIS — I69352 Hemiplegia and hemiparesis following cerebral infarction affecting left dominant side: Secondary | ICD-10-CM | POA: Diagnosis not present

## 2016-02-10 DIAGNOSIS — M25572 Pain in left ankle and joints of left foot: Secondary | ICD-10-CM | POA: Diagnosis not present

## 2016-02-10 DIAGNOSIS — Z4789 Encounter for other orthopedic aftercare: Secondary | ICD-10-CM | POA: Diagnosis not present

## 2016-02-11 DIAGNOSIS — I10 Essential (primary) hypertension: Secondary | ICD-10-CM | POA: Diagnosis not present

## 2016-02-11 DIAGNOSIS — I480 Paroxysmal atrial fibrillation: Secondary | ICD-10-CM | POA: Diagnosis not present

## 2016-02-11 DIAGNOSIS — I69352 Hemiplegia and hemiparesis following cerebral infarction affecting left dominant side: Secondary | ICD-10-CM | POA: Diagnosis not present

## 2016-02-11 DIAGNOSIS — M1991 Primary osteoarthritis, unspecified site: Secondary | ICD-10-CM | POA: Diagnosis not present

## 2016-02-11 DIAGNOSIS — J449 Chronic obstructive pulmonary disease, unspecified: Secondary | ICD-10-CM | POA: Diagnosis not present

## 2016-02-14 DIAGNOSIS — E876 Hypokalemia: Secondary | ICD-10-CM | POA: Diagnosis not present

## 2016-02-14 DIAGNOSIS — Z09 Encounter for follow-up examination after completed treatment for conditions other than malignant neoplasm: Secondary | ICD-10-CM | POA: Diagnosis not present

## 2016-02-14 DIAGNOSIS — E039 Hypothyroidism, unspecified: Secondary | ICD-10-CM | POA: Diagnosis not present

## 2016-02-16 DIAGNOSIS — I10 Essential (primary) hypertension: Secondary | ICD-10-CM | POA: Diagnosis not present

## 2016-02-16 DIAGNOSIS — J449 Chronic obstructive pulmonary disease, unspecified: Secondary | ICD-10-CM | POA: Diagnosis not present

## 2016-02-16 DIAGNOSIS — M1991 Primary osteoarthritis, unspecified site: Secondary | ICD-10-CM | POA: Diagnosis not present

## 2016-02-16 DIAGNOSIS — I480 Paroxysmal atrial fibrillation: Secondary | ICD-10-CM | POA: Diagnosis not present

## 2016-02-16 DIAGNOSIS — I69352 Hemiplegia and hemiparesis following cerebral infarction affecting left dominant side: Secondary | ICD-10-CM | POA: Diagnosis not present

## 2016-02-24 DIAGNOSIS — I480 Paroxysmal atrial fibrillation: Secondary | ICD-10-CM | POA: Diagnosis not present

## 2016-02-24 DIAGNOSIS — I69352 Hemiplegia and hemiparesis following cerebral infarction affecting left dominant side: Secondary | ICD-10-CM | POA: Diagnosis not present

## 2016-02-24 DIAGNOSIS — J449 Chronic obstructive pulmonary disease, unspecified: Secondary | ICD-10-CM | POA: Diagnosis not present

## 2016-02-24 DIAGNOSIS — I10 Essential (primary) hypertension: Secondary | ICD-10-CM | POA: Diagnosis not present

## 2016-02-24 DIAGNOSIS — M1991 Primary osteoarthritis, unspecified site: Secondary | ICD-10-CM | POA: Diagnosis not present

## 2016-02-25 DIAGNOSIS — M1991 Primary osteoarthritis, unspecified site: Secondary | ICD-10-CM | POA: Diagnosis not present

## 2016-02-25 DIAGNOSIS — J449 Chronic obstructive pulmonary disease, unspecified: Secondary | ICD-10-CM | POA: Diagnosis not present

## 2016-02-25 DIAGNOSIS — I69352 Hemiplegia and hemiparesis following cerebral infarction affecting left dominant side: Secondary | ICD-10-CM | POA: Diagnosis not present

## 2016-02-25 DIAGNOSIS — I480 Paroxysmal atrial fibrillation: Secondary | ICD-10-CM | POA: Diagnosis not present

## 2016-02-25 DIAGNOSIS — I10 Essential (primary) hypertension: Secondary | ICD-10-CM | POA: Diagnosis not present

## 2016-02-28 DIAGNOSIS — I951 Orthostatic hypotension: Secondary | ICD-10-CM | POA: Diagnosis not present

## 2016-02-28 DIAGNOSIS — S82892A Other fracture of left lower leg, initial encounter for closed fracture: Secondary | ICD-10-CM | POA: Diagnosis not present

## 2016-02-28 DIAGNOSIS — Z959 Presence of cardiac and vascular implant and graft, unspecified: Secondary | ICD-10-CM | POA: Diagnosis not present

## 2016-03-08 DIAGNOSIS — I69352 Hemiplegia and hemiparesis following cerebral infarction affecting left dominant side: Secondary | ICD-10-CM | POA: Diagnosis not present

## 2016-03-08 DIAGNOSIS — J449 Chronic obstructive pulmonary disease, unspecified: Secondary | ICD-10-CM | POA: Diagnosis not present

## 2016-03-08 DIAGNOSIS — I10 Essential (primary) hypertension: Secondary | ICD-10-CM | POA: Diagnosis not present

## 2016-03-08 DIAGNOSIS — I480 Paroxysmal atrial fibrillation: Secondary | ICD-10-CM | POA: Diagnosis not present

## 2016-03-08 DIAGNOSIS — M1991 Primary osteoarthritis, unspecified site: Secondary | ICD-10-CM | POA: Diagnosis not present

## 2016-03-15 DIAGNOSIS — J449 Chronic obstructive pulmonary disease, unspecified: Secondary | ICD-10-CM | POA: Diagnosis not present

## 2016-03-15 DIAGNOSIS — M1991 Primary osteoarthritis, unspecified site: Secondary | ICD-10-CM | POA: Diagnosis not present

## 2016-03-15 DIAGNOSIS — I480 Paroxysmal atrial fibrillation: Secondary | ICD-10-CM | POA: Diagnosis not present

## 2016-03-15 DIAGNOSIS — I10 Essential (primary) hypertension: Secondary | ICD-10-CM | POA: Diagnosis not present

## 2016-03-15 DIAGNOSIS — I69352 Hemiplegia and hemiparesis following cerebral infarction affecting left dominant side: Secondary | ICD-10-CM | POA: Diagnosis not present

## 2016-03-20 DIAGNOSIS — E876 Hypokalemia: Secondary | ICD-10-CM | POA: Diagnosis not present

## 2016-03-29 DIAGNOSIS — S82892A Other fracture of left lower leg, initial encounter for closed fracture: Secondary | ICD-10-CM | POA: Diagnosis not present

## 2016-03-29 DIAGNOSIS — I951 Orthostatic hypotension: Secondary | ICD-10-CM | POA: Diagnosis not present

## 2016-03-29 DIAGNOSIS — E039 Hypothyroidism, unspecified: Secondary | ICD-10-CM | POA: Diagnosis not present

## 2016-03-29 DIAGNOSIS — E782 Mixed hyperlipidemia: Secondary | ICD-10-CM | POA: Diagnosis not present

## 2016-03-29 DIAGNOSIS — K529 Noninfective gastroenteritis and colitis, unspecified: Secondary | ICD-10-CM | POA: Diagnosis not present

## 2016-03-29 DIAGNOSIS — J449 Chronic obstructive pulmonary disease, unspecified: Secondary | ICD-10-CM | POA: Diagnosis not present

## 2016-04-06 DIAGNOSIS — I471 Supraventricular tachycardia: Secondary | ICD-10-CM | POA: Diagnosis not present

## 2016-04-06 DIAGNOSIS — I951 Orthostatic hypotension: Secondary | ICD-10-CM | POA: Diagnosis not present

## 2016-04-06 DIAGNOSIS — I10 Essential (primary) hypertension: Secondary | ICD-10-CM | POA: Diagnosis not present

## 2016-04-06 DIAGNOSIS — I480 Paroxysmal atrial fibrillation: Secondary | ICD-10-CM | POA: Diagnosis not present

## 2016-04-06 DIAGNOSIS — I483 Typical atrial flutter: Secondary | ICD-10-CM | POA: Diagnosis not present

## 2016-04-21 DIAGNOSIS — I10 Essential (primary) hypertension: Secondary | ICD-10-CM | POA: Diagnosis not present

## 2016-04-21 DIAGNOSIS — I483 Typical atrial flutter: Secondary | ICD-10-CM | POA: Diagnosis not present

## 2016-04-21 DIAGNOSIS — I471 Supraventricular tachycardia: Secondary | ICD-10-CM | POA: Diagnosis not present

## 2016-04-21 DIAGNOSIS — I480 Paroxysmal atrial fibrillation: Secondary | ICD-10-CM | POA: Diagnosis not present

## 2016-04-29 DIAGNOSIS — S82892A Other fracture of left lower leg, initial encounter for closed fracture: Secondary | ICD-10-CM | POA: Diagnosis not present

## 2016-04-29 DIAGNOSIS — I951 Orthostatic hypotension: Secondary | ICD-10-CM | POA: Diagnosis not present

## 2016-05-16 DIAGNOSIS — E782 Mixed hyperlipidemia: Secondary | ICD-10-CM | POA: Diagnosis not present

## 2016-05-16 DIAGNOSIS — E039 Hypothyroidism, unspecified: Secondary | ICD-10-CM | POA: Diagnosis not present

## 2016-05-16 DIAGNOSIS — Z0000 Encounter for general adult medical examination without abnormal findings: Secondary | ICD-10-CM | POA: Diagnosis not present

## 2016-05-16 DIAGNOSIS — J449 Chronic obstructive pulmonary disease, unspecified: Secondary | ICD-10-CM | POA: Diagnosis not present

## 2016-05-16 DIAGNOSIS — N390 Urinary tract infection, site not specified: Secondary | ICD-10-CM | POA: Diagnosis not present

## 2016-05-16 DIAGNOSIS — M810 Age-related osteoporosis without current pathological fracture: Secondary | ICD-10-CM | POA: Diagnosis not present

## 2016-05-23 DIAGNOSIS — M810 Age-related osteoporosis without current pathological fracture: Secondary | ICD-10-CM | POA: Diagnosis not present

## 2016-05-23 DIAGNOSIS — I480 Paroxysmal atrial fibrillation: Secondary | ICD-10-CM | POA: Diagnosis not present

## 2016-05-23 DIAGNOSIS — E039 Hypothyroidism, unspecified: Secondary | ICD-10-CM | POA: Diagnosis not present

## 2016-05-23 DIAGNOSIS — R05 Cough: Secondary | ICD-10-CM | POA: Diagnosis not present

## 2016-05-23 DIAGNOSIS — J449 Chronic obstructive pulmonary disease, unspecified: Secondary | ICD-10-CM | POA: Diagnosis not present

## 2016-05-23 DIAGNOSIS — E782 Mixed hyperlipidemia: Secondary | ICD-10-CM | POA: Diagnosis not present

## 2016-05-27 DIAGNOSIS — Z959 Presence of cardiac and vascular implant and graft, unspecified: Secondary | ICD-10-CM | POA: Diagnosis not present

## 2016-05-27 DIAGNOSIS — R55 Syncope and collapse: Secondary | ICD-10-CM | POA: Diagnosis not present

## 2016-05-30 DIAGNOSIS — I951 Orthostatic hypotension: Secondary | ICD-10-CM | POA: Diagnosis not present

## 2016-05-30 DIAGNOSIS — S82892A Other fracture of left lower leg, initial encounter for closed fracture: Secondary | ICD-10-CM | POA: Diagnosis not present

## 2016-06-01 DIAGNOSIS — I480 Paroxysmal atrial fibrillation: Secondary | ICD-10-CM | POA: Diagnosis not present

## 2016-06-01 DIAGNOSIS — I10 Essential (primary) hypertension: Secondary | ICD-10-CM | POA: Diagnosis not present

## 2016-06-01 DIAGNOSIS — I471 Supraventricular tachycardia: Secondary | ICD-10-CM | POA: Diagnosis not present

## 2016-06-27 DIAGNOSIS — S82892A Other fracture of left lower leg, initial encounter for closed fracture: Secondary | ICD-10-CM | POA: Diagnosis not present

## 2016-06-27 DIAGNOSIS — I951 Orthostatic hypotension: Secondary | ICD-10-CM | POA: Diagnosis not present

## 2016-07-10 DIAGNOSIS — R0602 Shortness of breath: Secondary | ICD-10-CM | POA: Diagnosis not present

## 2016-07-10 DIAGNOSIS — R05 Cough: Secondary | ICD-10-CM | POA: Diagnosis not present

## 2016-07-10 DIAGNOSIS — J449 Chronic obstructive pulmonary disease, unspecified: Secondary | ICD-10-CM | POA: Diagnosis not present

## 2016-07-10 DIAGNOSIS — I10 Essential (primary) hypertension: Secondary | ICD-10-CM | POA: Diagnosis not present

## 2016-07-10 DIAGNOSIS — J45909 Unspecified asthma, uncomplicated: Secondary | ICD-10-CM | POA: Diagnosis not present

## 2016-07-28 DIAGNOSIS — S82892A Other fracture of left lower leg, initial encounter for closed fracture: Secondary | ICD-10-CM | POA: Diagnosis not present

## 2016-07-28 DIAGNOSIS — I951 Orthostatic hypotension: Secondary | ICD-10-CM | POA: Diagnosis not present

## 2016-08-18 DIAGNOSIS — J449 Chronic obstructive pulmonary disease, unspecified: Secondary | ICD-10-CM | POA: Diagnosis not present

## 2016-08-21 DIAGNOSIS — E039 Hypothyroidism, unspecified: Secondary | ICD-10-CM | POA: Diagnosis not present

## 2016-08-21 DIAGNOSIS — E782 Mixed hyperlipidemia: Secondary | ICD-10-CM | POA: Diagnosis not present

## 2016-08-21 DIAGNOSIS — R319 Hematuria, unspecified: Secondary | ICD-10-CM | POA: Diagnosis not present

## 2016-08-24 DIAGNOSIS — J449 Chronic obstructive pulmonary disease, unspecified: Secondary | ICD-10-CM | POA: Diagnosis not present

## 2016-08-27 DIAGNOSIS — S82892A Other fracture of left lower leg, initial encounter for closed fracture: Secondary | ICD-10-CM | POA: Diagnosis not present

## 2016-08-27 DIAGNOSIS — I951 Orthostatic hypotension: Secondary | ICD-10-CM | POA: Diagnosis not present

## 2016-08-28 DIAGNOSIS — R55 Syncope and collapse: Secondary | ICD-10-CM | POA: Diagnosis not present

## 2016-08-28 DIAGNOSIS — Z959 Presence of cardiac and vascular implant and graft, unspecified: Secondary | ICD-10-CM | POA: Diagnosis not present

## 2016-09-05 DIAGNOSIS — R319 Hematuria, unspecified: Secondary | ICD-10-CM | POA: Diagnosis not present

## 2016-09-05 DIAGNOSIS — R7303 Prediabetes: Secondary | ICD-10-CM | POA: Diagnosis not present

## 2016-09-05 DIAGNOSIS — E039 Hypothyroidism, unspecified: Secondary | ICD-10-CM | POA: Diagnosis not present

## 2016-09-05 DIAGNOSIS — E782 Mixed hyperlipidemia: Secondary | ICD-10-CM | POA: Diagnosis not present

## 2016-11-14 DIAGNOSIS — R938 Abnormal findings on diagnostic imaging of other specified body structures: Secondary | ICD-10-CM | POA: Diagnosis not present

## 2016-11-14 DIAGNOSIS — R0602 Shortness of breath: Secondary | ICD-10-CM | POA: Diagnosis not present

## 2016-11-14 DIAGNOSIS — J986 Disorders of diaphragm: Secondary | ICD-10-CM | POA: Diagnosis not present

## 2016-11-14 DIAGNOSIS — R942 Abnormal results of pulmonary function studies: Secondary | ICD-10-CM | POA: Diagnosis not present

## 2016-11-23 DIAGNOSIS — J9811 Atelectasis: Secondary | ICD-10-CM | POA: Diagnosis not present

## 2016-11-23 DIAGNOSIS — I517 Cardiomegaly: Secondary | ICD-10-CM | POA: Diagnosis not present

## 2016-11-23 DIAGNOSIS — R410 Disorientation, unspecified: Secondary | ICD-10-CM | POA: Diagnosis not present

## 2016-11-23 DIAGNOSIS — R531 Weakness: Secondary | ICD-10-CM | POA: Diagnosis not present

## 2016-11-23 DIAGNOSIS — Z7901 Long term (current) use of anticoagulants: Secondary | ICD-10-CM | POA: Diagnosis not present

## 2016-11-23 DIAGNOSIS — M6281 Muscle weakness (generalized): Secondary | ICD-10-CM | POA: Diagnosis not present

## 2016-11-23 DIAGNOSIS — R319 Hematuria, unspecified: Secondary | ICD-10-CM | POA: Diagnosis not present

## 2016-11-23 DIAGNOSIS — Z87891 Personal history of nicotine dependence: Secondary | ICD-10-CM | POA: Diagnosis not present

## 2016-11-23 DIAGNOSIS — R9082 White matter disease, unspecified: Secondary | ICD-10-CM | POA: Diagnosis not present

## 2016-11-23 DIAGNOSIS — R4182 Altered mental status, unspecified: Secondary | ICD-10-CM | POA: Diagnosis not present

## 2016-11-23 DIAGNOSIS — I4891 Unspecified atrial fibrillation: Secondary | ICD-10-CM | POA: Diagnosis not present

## 2016-11-23 DIAGNOSIS — R938 Abnormal findings on diagnostic imaging of other specified body structures: Secondary | ICD-10-CM | POA: Diagnosis not present

## 2016-11-23 DIAGNOSIS — Z8673 Personal history of transient ischemic attack (TIA), and cerebral infarction without residual deficits: Secondary | ICD-10-CM | POA: Diagnosis not present

## 2016-11-28 DIAGNOSIS — E782 Mixed hyperlipidemia: Secondary | ICD-10-CM | POA: Diagnosis not present

## 2016-11-28 DIAGNOSIS — I4891 Unspecified atrial fibrillation: Secondary | ICD-10-CM | POA: Diagnosis not present

## 2016-11-28 DIAGNOSIS — M419 Scoliosis, unspecified: Secondary | ICD-10-CM | POA: Diagnosis not present

## 2016-11-28 DIAGNOSIS — E039 Hypothyroidism, unspecified: Secondary | ICD-10-CM | POA: Diagnosis not present

## 2016-11-28 DIAGNOSIS — M810 Age-related osteoporosis without current pathological fracture: Secondary | ICD-10-CM | POA: Diagnosis not present

## 2016-11-28 DIAGNOSIS — J984 Other disorders of lung: Secondary | ICD-10-CM | POA: Diagnosis not present

## 2016-11-28 DIAGNOSIS — J986 Disorders of diaphragm: Secondary | ICD-10-CM | POA: Diagnosis not present

## 2016-11-28 DIAGNOSIS — R7303 Prediabetes: Secondary | ICD-10-CM | POA: Diagnosis not present

## 2016-12-05 DIAGNOSIS — I4891 Unspecified atrial fibrillation: Secondary | ICD-10-CM | POA: Diagnosis not present

## 2016-12-05 DIAGNOSIS — N179 Acute kidney failure, unspecified: Secondary | ICD-10-CM | POA: Diagnosis not present

## 2016-12-05 DIAGNOSIS — R109 Unspecified abdominal pain: Secondary | ICD-10-CM | POA: Diagnosis not present

## 2016-12-05 DIAGNOSIS — R9341 Abnormal radiologic findings on diagnostic imaging of renal pelvis, ureter, or bladder: Secondary | ICD-10-CM | POA: Diagnosis not present

## 2016-12-05 DIAGNOSIS — R404 Transient alteration of awareness: Secondary | ICD-10-CM | POA: Diagnosis not present

## 2016-12-05 DIAGNOSIS — E039 Hypothyroidism, unspecified: Secondary | ICD-10-CM | POA: Diagnosis not present

## 2016-12-05 DIAGNOSIS — Z87891 Personal history of nicotine dependence: Secondary | ICD-10-CM | POA: Diagnosis not present

## 2016-12-05 DIAGNOSIS — J984 Other disorders of lung: Secondary | ICD-10-CM | POA: Diagnosis not present

## 2016-12-05 DIAGNOSIS — R509 Fever, unspecified: Secondary | ICD-10-CM | POA: Diagnosis not present

## 2016-12-05 DIAGNOSIS — I517 Cardiomegaly: Secondary | ICD-10-CM | POA: Diagnosis not present

## 2016-12-05 DIAGNOSIS — E876 Hypokalemia: Secondary | ICD-10-CM | POA: Diagnosis not present

## 2016-12-05 DIAGNOSIS — R350 Frequency of micturition: Secondary | ICD-10-CM | POA: Diagnosis not present

## 2016-12-05 DIAGNOSIS — R0602 Shortness of breath: Secondary | ICD-10-CM | POA: Diagnosis not present

## 2016-12-05 DIAGNOSIS — I480 Paroxysmal atrial fibrillation: Secondary | ICD-10-CM | POA: Diagnosis not present

## 2016-12-05 DIAGNOSIS — I081 Rheumatic disorders of both mitral and tricuspid valves: Secondary | ICD-10-CM | POA: Diagnosis not present

## 2016-12-05 DIAGNOSIS — J441 Chronic obstructive pulmonary disease with (acute) exacerbation: Secondary | ICD-10-CM | POA: Diagnosis not present

## 2016-12-05 DIAGNOSIS — N390 Urinary tract infection, site not specified: Secondary | ICD-10-CM | POA: Diagnosis not present

## 2016-12-05 DIAGNOSIS — R938 Abnormal findings on diagnostic imaging of other specified body structures: Secondary | ICD-10-CM | POA: Diagnosis not present

## 2016-12-05 DIAGNOSIS — A4151 Sepsis due to Escherichia coli [E. coli]: Secondary | ICD-10-CM | POA: Diagnosis not present

## 2016-12-05 DIAGNOSIS — J449 Chronic obstructive pulmonary disease, unspecified: Secondary | ICD-10-CM | POA: Diagnosis not present

## 2016-12-05 DIAGNOSIS — R531 Weakness: Secondary | ICD-10-CM | POA: Diagnosis not present

## 2016-12-05 DIAGNOSIS — I10 Essential (primary) hypertension: Secondary | ICD-10-CM | POA: Diagnosis not present

## 2016-12-05 DIAGNOSIS — R197 Diarrhea, unspecified: Secondary | ICD-10-CM | POA: Diagnosis not present

## 2016-12-05 DIAGNOSIS — N281 Cyst of kidney, acquired: Secondary | ICD-10-CM | POA: Diagnosis not present

## 2016-12-05 DIAGNOSIS — G40909 Epilepsy, unspecified, not intractable, without status epilepticus: Secondary | ICD-10-CM | POA: Diagnosis not present

## 2016-12-05 DIAGNOSIS — A419 Sepsis, unspecified organism: Secondary | ICD-10-CM | POA: Diagnosis not present

## 2016-12-05 DIAGNOSIS — I5189 Other ill-defined heart diseases: Secondary | ICD-10-CM | POA: Diagnosis not present

## 2016-12-05 DIAGNOSIS — M1990 Unspecified osteoarthritis, unspecified site: Secondary | ICD-10-CM | POA: Diagnosis not present

## 2016-12-05 DIAGNOSIS — R918 Other nonspecific abnormal finding of lung field: Secondary | ICD-10-CM | POA: Diagnosis not present

## 2016-12-05 DIAGNOSIS — N200 Calculus of kidney: Secondary | ICD-10-CM | POA: Diagnosis not present

## 2016-12-05 DIAGNOSIS — I481 Persistent atrial fibrillation: Secondary | ICD-10-CM | POA: Diagnosis not present

## 2016-12-14 DIAGNOSIS — J449 Chronic obstructive pulmonary disease, unspecified: Secondary | ICD-10-CM | POA: Diagnosis not present

## 2016-12-14 DIAGNOSIS — G40909 Epilepsy, unspecified, not intractable, without status epilepticus: Secondary | ICD-10-CM | POA: Diagnosis not present

## 2016-12-14 DIAGNOSIS — I481 Persistent atrial fibrillation: Secondary | ICD-10-CM | POA: Diagnosis not present

## 2016-12-14 DIAGNOSIS — B9620 Unspecified Escherichia coli [E. coli] as the cause of diseases classified elsewhere: Secondary | ICD-10-CM | POA: Diagnosis not present

## 2016-12-14 DIAGNOSIS — I951 Orthostatic hypotension: Secondary | ICD-10-CM | POA: Diagnosis not present

## 2016-12-14 DIAGNOSIS — N3001 Acute cystitis with hematuria: Secondary | ICD-10-CM | POA: Diagnosis not present

## 2016-12-18 DIAGNOSIS — I951 Orthostatic hypotension: Secondary | ICD-10-CM | POA: Diagnosis not present

## 2016-12-18 DIAGNOSIS — I481 Persistent atrial fibrillation: Secondary | ICD-10-CM | POA: Diagnosis not present

## 2016-12-18 DIAGNOSIS — G40909 Epilepsy, unspecified, not intractable, without status epilepticus: Secondary | ICD-10-CM | POA: Diagnosis not present

## 2016-12-18 DIAGNOSIS — B9620 Unspecified Escherichia coli [E. coli] as the cause of diseases classified elsewhere: Secondary | ICD-10-CM | POA: Diagnosis not present

## 2016-12-18 DIAGNOSIS — J449 Chronic obstructive pulmonary disease, unspecified: Secondary | ICD-10-CM | POA: Diagnosis not present

## 2016-12-18 DIAGNOSIS — N3001 Acute cystitis with hematuria: Secondary | ICD-10-CM | POA: Diagnosis not present

## 2016-12-19 DIAGNOSIS — R55 Syncope and collapse: Secondary | ICD-10-CM | POA: Diagnosis not present

## 2016-12-19 DIAGNOSIS — Z959 Presence of cardiac and vascular implant and graft, unspecified: Secondary | ICD-10-CM | POA: Diagnosis not present

## 2016-12-19 DIAGNOSIS — I480 Paroxysmal atrial fibrillation: Secondary | ICD-10-CM | POA: Diagnosis not present

## 2016-12-20 DIAGNOSIS — J986 Disorders of diaphragm: Secondary | ICD-10-CM | POA: Diagnosis not present

## 2016-12-20 DIAGNOSIS — R0602 Shortness of breath: Secondary | ICD-10-CM | POA: Diagnosis not present

## 2016-12-20 DIAGNOSIS — G40909 Epilepsy, unspecified, not intractable, without status epilepticus: Secondary | ICD-10-CM | POA: Diagnosis not present

## 2016-12-20 DIAGNOSIS — M419 Scoliosis, unspecified: Secondary | ICD-10-CM | POA: Diagnosis not present

## 2016-12-21 DIAGNOSIS — J449 Chronic obstructive pulmonary disease, unspecified: Secondary | ICD-10-CM | POA: Diagnosis not present

## 2016-12-21 DIAGNOSIS — E782 Mixed hyperlipidemia: Secondary | ICD-10-CM | POA: Diagnosis not present

## 2016-12-21 DIAGNOSIS — E039 Hypothyroidism, unspecified: Secondary | ICD-10-CM | POA: Diagnosis not present

## 2016-12-25 DIAGNOSIS — B9620 Unspecified Escherichia coli [E. coli] as the cause of diseases classified elsewhere: Secondary | ICD-10-CM | POA: Diagnosis not present

## 2016-12-25 DIAGNOSIS — J449 Chronic obstructive pulmonary disease, unspecified: Secondary | ICD-10-CM | POA: Diagnosis not present

## 2016-12-25 DIAGNOSIS — G40909 Epilepsy, unspecified, not intractable, without status epilepticus: Secondary | ICD-10-CM | POA: Diagnosis not present

## 2016-12-25 DIAGNOSIS — I951 Orthostatic hypotension: Secondary | ICD-10-CM | POA: Diagnosis not present

## 2016-12-25 DIAGNOSIS — I481 Persistent atrial fibrillation: Secondary | ICD-10-CM | POA: Diagnosis not present

## 2016-12-25 DIAGNOSIS — N3001 Acute cystitis with hematuria: Secondary | ICD-10-CM | POA: Diagnosis not present

## 2016-12-27 DIAGNOSIS — I951 Orthostatic hypotension: Secondary | ICD-10-CM | POA: Diagnosis not present

## 2016-12-27 DIAGNOSIS — G40909 Epilepsy, unspecified, not intractable, without status epilepticus: Secondary | ICD-10-CM | POA: Diagnosis not present

## 2016-12-27 DIAGNOSIS — I481 Persistent atrial fibrillation: Secondary | ICD-10-CM | POA: Diagnosis not present

## 2016-12-27 DIAGNOSIS — B9620 Unspecified Escherichia coli [E. coli] as the cause of diseases classified elsewhere: Secondary | ICD-10-CM | POA: Diagnosis not present

## 2016-12-27 DIAGNOSIS — N3001 Acute cystitis with hematuria: Secondary | ICD-10-CM | POA: Diagnosis not present

## 2016-12-27 DIAGNOSIS — J449 Chronic obstructive pulmonary disease, unspecified: Secondary | ICD-10-CM | POA: Diagnosis not present

## 2016-12-29 DIAGNOSIS — Z87891 Personal history of nicotine dependence: Secondary | ICD-10-CM | POA: Diagnosis not present

## 2016-12-29 DIAGNOSIS — G40909 Epilepsy, unspecified, not intractable, without status epilepticus: Secondary | ICD-10-CM | POA: Diagnosis not present

## 2016-12-29 DIAGNOSIS — Z23 Encounter for immunization: Secondary | ICD-10-CM | POA: Diagnosis not present

## 2016-12-29 DIAGNOSIS — R531 Weakness: Secondary | ICD-10-CM | POA: Diagnosis not present

## 2016-12-29 DIAGNOSIS — R0600 Dyspnea, unspecified: Secondary | ICD-10-CM | POA: Diagnosis not present

## 2016-12-29 DIAGNOSIS — I481 Persistent atrial fibrillation: Secondary | ICD-10-CM | POA: Diagnosis not present

## 2016-12-29 DIAGNOSIS — J449 Chronic obstructive pulmonary disease, unspecified: Secondary | ICD-10-CM | POA: Diagnosis not present

## 2016-12-29 DIAGNOSIS — I10 Essential (primary) hypertension: Secondary | ICD-10-CM | POA: Diagnosis not present

## 2016-12-29 DIAGNOSIS — B9620 Unspecified Escherichia coli [E. coli] as the cause of diseases classified elsewhere: Secondary | ICD-10-CM | POA: Diagnosis not present

## 2016-12-29 DIAGNOSIS — A0471 Enterocolitis due to Clostridium difficile, recurrent: Secondary | ICD-10-CM | POA: Diagnosis not present

## 2016-12-29 DIAGNOSIS — N3001 Acute cystitis with hematuria: Secondary | ICD-10-CM | POA: Diagnosis not present

## 2016-12-29 DIAGNOSIS — Z8673 Personal history of transient ischemic attack (TIA), and cerebral infarction without residual deficits: Secondary | ICD-10-CM | POA: Diagnosis not present

## 2016-12-29 DIAGNOSIS — R404 Transient alteration of awareness: Secondary | ICD-10-CM | POA: Diagnosis not present

## 2016-12-29 DIAGNOSIS — E039 Hypothyroidism, unspecified: Secondary | ICD-10-CM | POA: Diagnosis not present

## 2016-12-29 DIAGNOSIS — R509 Fever, unspecified: Secondary | ICD-10-CM | POA: Diagnosis not present

## 2016-12-29 DIAGNOSIS — R938 Abnormal findings on diagnostic imaging of other specified body structures: Secondary | ICD-10-CM | POA: Diagnosis not present

## 2016-12-29 DIAGNOSIS — E876 Hypokalemia: Secondary | ICD-10-CM | POA: Diagnosis not present

## 2016-12-29 DIAGNOSIS — F4321 Adjustment disorder with depressed mood: Secondary | ICD-10-CM | POA: Diagnosis not present

## 2016-12-30 DIAGNOSIS — R509 Fever, unspecified: Secondary | ICD-10-CM | POA: Diagnosis not present

## 2016-12-30 DIAGNOSIS — A0472 Enterocolitis due to Clostridium difficile, not specified as recurrent: Secondary | ICD-10-CM | POA: Diagnosis not present

## 2016-12-30 DIAGNOSIS — Z8673 Personal history of transient ischemic attack (TIA), and cerebral infarction without residual deficits: Secondary | ICD-10-CM | POA: Diagnosis not present

## 2016-12-30 DIAGNOSIS — I481 Persistent atrial fibrillation: Secondary | ICD-10-CM | POA: Diagnosis not present

## 2016-12-31 DIAGNOSIS — R509 Fever, unspecified: Secondary | ICD-10-CM | POA: Diagnosis not present

## 2016-12-31 DIAGNOSIS — I481 Persistent atrial fibrillation: Secondary | ICD-10-CM | POA: Diagnosis not present

## 2016-12-31 DIAGNOSIS — A0472 Enterocolitis due to Clostridium difficile, not specified as recurrent: Secondary | ICD-10-CM | POA: Diagnosis not present

## 2016-12-31 DIAGNOSIS — Z8673 Personal history of transient ischemic attack (TIA), and cerebral infarction without residual deficits: Secondary | ICD-10-CM | POA: Diagnosis not present

## 2017-01-01 DIAGNOSIS — A0472 Enterocolitis due to Clostridium difficile, not specified as recurrent: Secondary | ICD-10-CM | POA: Diagnosis not present

## 2017-01-01 DIAGNOSIS — Z8673 Personal history of transient ischemic attack (TIA), and cerebral infarction without residual deficits: Secondary | ICD-10-CM | POA: Diagnosis not present

## 2017-01-01 DIAGNOSIS — I481 Persistent atrial fibrillation: Secondary | ICD-10-CM | POA: Diagnosis not present

## 2017-01-01 DIAGNOSIS — R509 Fever, unspecified: Secondary | ICD-10-CM | POA: Diagnosis not present

## 2017-01-02 DIAGNOSIS — R509 Fever, unspecified: Secondary | ICD-10-CM | POA: Diagnosis not present

## 2017-01-03 DIAGNOSIS — R509 Fever, unspecified: Secondary | ICD-10-CM | POA: Diagnosis not present

## 2017-01-04 DIAGNOSIS — R509 Fever, unspecified: Secondary | ICD-10-CM | POA: Diagnosis not present

## 2017-01-05 DIAGNOSIS — R509 Fever, unspecified: Secondary | ICD-10-CM | POA: Diagnosis not present

## 2017-01-06 DIAGNOSIS — R509 Fever, unspecified: Secondary | ICD-10-CM | POA: Diagnosis not present

## 2017-01-07 DIAGNOSIS — R509 Fever, unspecified: Secondary | ICD-10-CM | POA: Diagnosis not present

## 2017-01-08 DIAGNOSIS — J449 Chronic obstructive pulmonary disease, unspecified: Secondary | ICD-10-CM | POA: Diagnosis not present

## 2017-01-08 DIAGNOSIS — G40909 Epilepsy, unspecified, not intractable, without status epilepticus: Secondary | ICD-10-CM | POA: Diagnosis not present

## 2017-01-08 DIAGNOSIS — B9620 Unspecified Escherichia coli [E. coli] as the cause of diseases classified elsewhere: Secondary | ICD-10-CM | POA: Diagnosis not present

## 2017-01-08 DIAGNOSIS — I951 Orthostatic hypotension: Secondary | ICD-10-CM | POA: Diagnosis not present

## 2017-01-08 DIAGNOSIS — N3001 Acute cystitis with hematuria: Secondary | ICD-10-CM | POA: Diagnosis not present

## 2017-01-08 DIAGNOSIS — I481 Persistent atrial fibrillation: Secondary | ICD-10-CM | POA: Diagnosis not present

## 2017-01-15 DIAGNOSIS — G40909 Epilepsy, unspecified, not intractable, without status epilepticus: Secondary | ICD-10-CM | POA: Diagnosis not present

## 2017-01-15 DIAGNOSIS — I951 Orthostatic hypotension: Secondary | ICD-10-CM | POA: Diagnosis not present

## 2017-01-15 DIAGNOSIS — I481 Persistent atrial fibrillation: Secondary | ICD-10-CM | POA: Diagnosis not present

## 2017-01-15 DIAGNOSIS — N3001 Acute cystitis with hematuria: Secondary | ICD-10-CM | POA: Diagnosis not present

## 2017-01-15 DIAGNOSIS — B9620 Unspecified Escherichia coli [E. coli] as the cause of diseases classified elsewhere: Secondary | ICD-10-CM | POA: Diagnosis not present

## 2017-01-15 DIAGNOSIS — J449 Chronic obstructive pulmonary disease, unspecified: Secondary | ICD-10-CM | POA: Diagnosis not present

## 2017-01-24 DIAGNOSIS — N3001 Acute cystitis with hematuria: Secondary | ICD-10-CM | POA: Diagnosis not present

## 2017-01-24 DIAGNOSIS — B9620 Unspecified Escherichia coli [E. coli] as the cause of diseases classified elsewhere: Secondary | ICD-10-CM | POA: Diagnosis not present

## 2017-01-24 DIAGNOSIS — J449 Chronic obstructive pulmonary disease, unspecified: Secondary | ICD-10-CM | POA: Diagnosis not present

## 2017-01-24 DIAGNOSIS — I951 Orthostatic hypotension: Secondary | ICD-10-CM | POA: Diagnosis not present

## 2017-01-24 DIAGNOSIS — G40909 Epilepsy, unspecified, not intractable, without status epilepticus: Secondary | ICD-10-CM | POA: Diagnosis not present

## 2017-01-24 DIAGNOSIS — I481 Persistent atrial fibrillation: Secondary | ICD-10-CM | POA: Diagnosis not present

## 2017-01-26 DIAGNOSIS — Z4509 Encounter for adjustment and management of other cardiac device: Secondary | ICD-10-CM | POA: Diagnosis not present

## 2017-01-26 DIAGNOSIS — I482 Chronic atrial fibrillation: Secondary | ICD-10-CM | POA: Diagnosis not present

## 2017-01-26 DIAGNOSIS — I471 Supraventricular tachycardia: Secondary | ICD-10-CM | POA: Diagnosis not present

## 2017-01-26 DIAGNOSIS — I10 Essential (primary) hypertension: Secondary | ICD-10-CM | POA: Diagnosis not present

## 2017-01-26 DIAGNOSIS — I483 Typical atrial flutter: Secondary | ICD-10-CM | POA: Diagnosis not present

## 2017-01-29 DIAGNOSIS — N3000 Acute cystitis without hematuria: Secondary | ICD-10-CM | POA: Diagnosis not present

## 2017-01-29 DIAGNOSIS — I6310 Cerebral infarction due to embolism of unspecified precerebral artery: Secondary | ICD-10-CM | POA: Diagnosis not present

## 2017-01-29 DIAGNOSIS — R404 Transient alteration of awareness: Secondary | ICD-10-CM | POA: Diagnosis not present

## 2017-01-29 DIAGNOSIS — R270 Ataxia, unspecified: Secondary | ICD-10-CM | POA: Diagnosis not present

## 2017-01-29 DIAGNOSIS — Z452 Encounter for adjustment and management of vascular access device: Secondary | ICD-10-CM | POA: Diagnosis not present

## 2017-01-29 DIAGNOSIS — J690 Pneumonitis due to inhalation of food and vomit: Secondary | ICD-10-CM | POA: Diagnosis not present

## 2017-01-29 DIAGNOSIS — R9389 Abnormal findings on diagnostic imaging of other specified body structures: Secondary | ICD-10-CM | POA: Diagnosis not present

## 2017-01-29 DIAGNOSIS — G9340 Encephalopathy, unspecified: Secondary | ICD-10-CM | POA: Diagnosis not present

## 2017-01-29 DIAGNOSIS — Z4682 Encounter for fitting and adjustment of non-vascular catheter: Secondary | ICD-10-CM | POA: Diagnosis not present

## 2017-01-29 DIAGNOSIS — R930 Abnormal findings on diagnostic imaging of skull and head, not elsewhere classified: Secondary | ICD-10-CM | POA: Diagnosis not present

## 2017-01-29 DIAGNOSIS — R410 Disorientation, unspecified: Secondary | ICD-10-CM | POA: Diagnosis not present

## 2017-01-29 DIAGNOSIS — J9601 Acute respiratory failure with hypoxia: Secondary | ICD-10-CM | POA: Diagnosis not present

## 2017-01-29 DIAGNOSIS — I471 Supraventricular tachycardia: Secondary | ICD-10-CM | POA: Diagnosis not present

## 2017-01-29 DIAGNOSIS — J449 Chronic obstructive pulmonary disease, unspecified: Secondary | ICD-10-CM | POA: Diagnosis not present

## 2017-01-29 DIAGNOSIS — J90 Pleural effusion, not elsewhere classified: Secondary | ICD-10-CM | POA: Diagnosis not present

## 2017-01-29 DIAGNOSIS — E440 Moderate protein-calorie malnutrition: Secondary | ICD-10-CM | POA: Diagnosis not present

## 2017-01-29 DIAGNOSIS — I4891 Unspecified atrial fibrillation: Secondary | ICD-10-CM | POA: Diagnosis not present

## 2017-01-29 DIAGNOSIS — R569 Unspecified convulsions: Secondary | ICD-10-CM | POA: Diagnosis not present

## 2017-01-29 DIAGNOSIS — F333 Major depressive disorder, recurrent, severe with psychotic symptoms: Secondary | ICD-10-CM | POA: Diagnosis not present

## 2017-01-29 DIAGNOSIS — R4182 Altered mental status, unspecified: Secondary | ICD-10-CM | POA: Diagnosis not present

## 2017-01-29 DIAGNOSIS — R0602 Shortness of breath: Secondary | ICD-10-CM | POA: Diagnosis not present

## 2017-01-29 DIAGNOSIS — R531 Weakness: Secondary | ICD-10-CM | POA: Diagnosis not present

## 2017-01-29 DIAGNOSIS — I517 Cardiomegaly: Secondary | ICD-10-CM | POA: Diagnosis not present

## 2017-01-29 DIAGNOSIS — R0603 Acute respiratory distress: Secondary | ICD-10-CM | POA: Diagnosis not present

## 2017-01-29 DIAGNOSIS — F202 Catatonic schizophrenia: Secondary | ICD-10-CM | POA: Diagnosis not present

## 2017-01-29 DIAGNOSIS — F329 Major depressive disorder, single episode, unspecified: Secondary | ICD-10-CM | POA: Diagnosis not present

## 2017-01-29 DIAGNOSIS — F4321 Adjustment disorder with depressed mood: Secondary | ICD-10-CM | POA: Diagnosis not present

## 2017-01-29 DIAGNOSIS — R918 Other nonspecific abnormal finding of lung field: Secondary | ICD-10-CM | POA: Diagnosis not present

## 2017-01-29 DIAGNOSIS — Z681 Body mass index (BMI) 19 or less, adult: Secondary | ICD-10-CM | POA: Diagnosis not present

## 2017-01-29 DIAGNOSIS — K449 Diaphragmatic hernia without obstruction or gangrene: Secondary | ICD-10-CM | POA: Diagnosis not present

## 2017-01-29 DIAGNOSIS — J9811 Atelectasis: Secondary | ICD-10-CM | POA: Diagnosis not present

## 2017-01-29 DIAGNOSIS — Z8673 Personal history of transient ischemic attack (TIA), and cerebral infarction without residual deficits: Secondary | ICD-10-CM | POA: Diagnosis not present

## 2017-01-29 DIAGNOSIS — E873 Alkalosis: Secondary | ICD-10-CM | POA: Diagnosis not present

## 2017-01-29 DIAGNOSIS — J984 Other disorders of lung: Secondary | ICD-10-CM | POA: Diagnosis not present

## 2017-01-29 DIAGNOSIS — E039 Hypothyroidism, unspecified: Secondary | ICD-10-CM | POA: Diagnosis not present

## 2017-01-29 DIAGNOSIS — S0990XA Unspecified injury of head, initial encounter: Secondary | ICD-10-CM | POA: Diagnosis not present

## 2017-01-29 DIAGNOSIS — Z978 Presence of other specified devices: Secondary | ICD-10-CM | POA: Diagnosis not present

## 2017-01-29 DIAGNOSIS — D696 Thrombocytopenia, unspecified: Secondary | ICD-10-CM | POA: Diagnosis not present

## 2017-01-29 DIAGNOSIS — F061 Catatonic disorder due to known physiological condition: Secondary | ICD-10-CM | POA: Diagnosis not present

## 2017-01-29 DIAGNOSIS — I10 Essential (primary) hypertension: Secondary | ICD-10-CM | POA: Diagnosis not present

## 2017-01-29 DIAGNOSIS — M545 Low back pain: Secondary | ICD-10-CM | POA: Diagnosis not present

## 2017-01-29 DIAGNOSIS — Z981 Arthrodesis status: Secondary | ICD-10-CM | POA: Diagnosis not present

## 2017-01-29 DIAGNOSIS — Z9911 Dependence on respirator [ventilator] status: Secondary | ICD-10-CM | POA: Diagnosis not present

## 2017-01-29 DIAGNOSIS — I951 Orthostatic hypotension: Secondary | ICD-10-CM | POA: Diagnosis not present

## 2017-01-29 DIAGNOSIS — J45909 Unspecified asthma, uncomplicated: Secondary | ICD-10-CM | POA: Diagnosis not present

## 2017-01-29 DIAGNOSIS — J9621 Acute and chronic respiratory failure with hypoxia: Secondary | ICD-10-CM | POA: Diagnosis not present

## 2017-01-29 DIAGNOSIS — I482 Chronic atrial fibrillation: Secondary | ICD-10-CM | POA: Diagnosis not present

## 2017-02-28 DIAGNOSIS — R000 Tachycardia, unspecified: Secondary | ICD-10-CM | POA: Diagnosis not present

## 2017-02-28 DIAGNOSIS — R197 Diarrhea, unspecified: Secondary | ICD-10-CM | POA: Diagnosis not present

## 2017-02-28 DIAGNOSIS — R531 Weakness: Secondary | ICD-10-CM | POA: Diagnosis not present

## 2017-02-28 DIAGNOSIS — Z981 Arthrodesis status: Secondary | ICD-10-CM | POA: Diagnosis not present

## 2017-02-28 DIAGNOSIS — R9389 Abnormal findings on diagnostic imaging of other specified body structures: Secondary | ICD-10-CM | POA: Diagnosis not present

## 2017-02-28 DIAGNOSIS — J9601 Acute respiratory failure with hypoxia: Secondary | ICD-10-CM | POA: Diagnosis not present

## 2017-02-28 DIAGNOSIS — N390 Urinary tract infection, site not specified: Secondary | ICD-10-CM | POA: Diagnosis not present

## 2017-02-28 DIAGNOSIS — E039 Hypothyroidism, unspecified: Secondary | ICD-10-CM | POA: Diagnosis not present

## 2017-02-28 DIAGNOSIS — Z885 Allergy status to narcotic agent status: Secondary | ICD-10-CM | POA: Diagnosis not present

## 2017-02-28 DIAGNOSIS — R918 Other nonspecific abnormal finding of lung field: Secondary | ICD-10-CM | POA: Diagnosis not present

## 2017-02-28 DIAGNOSIS — E860 Dehydration: Secondary | ICD-10-CM | POA: Diagnosis not present

## 2017-02-28 DIAGNOSIS — A0472 Enterocolitis due to Clostridium difficile, not specified as recurrent: Secondary | ICD-10-CM | POA: Diagnosis not present

## 2017-02-28 DIAGNOSIS — Z888 Allergy status to other drugs, medicaments and biological substances status: Secondary | ICD-10-CM | POA: Diagnosis not present

## 2017-02-28 DIAGNOSIS — G40909 Epilepsy, unspecified, not intractable, without status epilepticus: Secondary | ICD-10-CM | POA: Diagnosis not present

## 2017-02-28 DIAGNOSIS — I10 Essential (primary) hypertension: Secondary | ICD-10-CM | POA: Diagnosis not present

## 2017-02-28 DIAGNOSIS — B9689 Other specified bacterial agents as the cause of diseases classified elsewhere: Secondary | ICD-10-CM | POA: Diagnosis not present

## 2017-02-28 DIAGNOSIS — Z681 Body mass index (BMI) 19 or less, adult: Secondary | ICD-10-CM | POA: Diagnosis not present

## 2017-02-28 DIAGNOSIS — I482 Chronic atrial fibrillation: Secondary | ICD-10-CM | POA: Diagnosis not present

## 2017-02-28 DIAGNOSIS — E440 Moderate protein-calorie malnutrition: Secondary | ICD-10-CM | POA: Diagnosis not present

## 2017-02-28 DIAGNOSIS — I471 Supraventricular tachycardia: Secondary | ICD-10-CM | POA: Diagnosis not present

## 2017-02-28 DIAGNOSIS — Z87891 Personal history of nicotine dependence: Secondary | ICD-10-CM | POA: Diagnosis not present

## 2017-02-28 DIAGNOSIS — R0602 Shortness of breath: Secondary | ICD-10-CM | POA: Diagnosis not present

## 2017-02-28 DIAGNOSIS — F061 Catatonic disorder due to known physiological condition: Secondary | ICD-10-CM | POA: Diagnosis not present

## 2017-02-28 DIAGNOSIS — J449 Chronic obstructive pulmonary disease, unspecified: Secondary | ICD-10-CM | POA: Diagnosis not present

## 2017-03-07 DIAGNOSIS — I480 Paroxysmal atrial fibrillation: Secondary | ICD-10-CM | POA: Diagnosis not present

## 2017-03-07 DIAGNOSIS — A0471 Enterocolitis due to Clostridium difficile, recurrent: Secondary | ICD-10-CM | POA: Diagnosis not present

## 2017-03-07 DIAGNOSIS — I10 Essential (primary) hypertension: Secondary | ICD-10-CM | POA: Diagnosis not present

## 2017-03-07 DIAGNOSIS — J449 Chronic obstructive pulmonary disease, unspecified: Secondary | ICD-10-CM | POA: Diagnosis not present

## 2017-03-07 DIAGNOSIS — I471 Supraventricular tachycardia: Secondary | ICD-10-CM | POA: Diagnosis not present

## 2017-03-07 DIAGNOSIS — I483 Typical atrial flutter: Secondary | ICD-10-CM | POA: Diagnosis not present

## 2017-03-09 DIAGNOSIS — I483 Typical atrial flutter: Secondary | ICD-10-CM | POA: Diagnosis not present

## 2017-03-09 DIAGNOSIS — I480 Paroxysmal atrial fibrillation: Secondary | ICD-10-CM | POA: Diagnosis not present

## 2017-03-09 DIAGNOSIS — J449 Chronic obstructive pulmonary disease, unspecified: Secondary | ICD-10-CM | POA: Diagnosis not present

## 2017-03-09 DIAGNOSIS — I471 Supraventricular tachycardia: Secondary | ICD-10-CM | POA: Diagnosis not present

## 2017-03-09 DIAGNOSIS — A0471 Enterocolitis due to Clostridium difficile, recurrent: Secondary | ICD-10-CM | POA: Diagnosis not present

## 2017-03-09 DIAGNOSIS — I10 Essential (primary) hypertension: Secondary | ICD-10-CM | POA: Diagnosis not present

## 2017-03-12 DIAGNOSIS — I480 Paroxysmal atrial fibrillation: Secondary | ICD-10-CM | POA: Diagnosis not present

## 2017-03-12 DIAGNOSIS — J449 Chronic obstructive pulmonary disease, unspecified: Secondary | ICD-10-CM | POA: Diagnosis not present

## 2017-03-12 DIAGNOSIS — I10 Essential (primary) hypertension: Secondary | ICD-10-CM | POA: Diagnosis not present

## 2017-03-12 DIAGNOSIS — I483 Typical atrial flutter: Secondary | ICD-10-CM | POA: Diagnosis not present

## 2017-03-12 DIAGNOSIS — A0471 Enterocolitis due to Clostridium difficile, recurrent: Secondary | ICD-10-CM | POA: Diagnosis not present

## 2017-03-12 DIAGNOSIS — I471 Supraventricular tachycardia: Secondary | ICD-10-CM | POA: Diagnosis not present

## 2017-03-14 DIAGNOSIS — I480 Paroxysmal atrial fibrillation: Secondary | ICD-10-CM | POA: Diagnosis not present

## 2017-03-14 DIAGNOSIS — I10 Essential (primary) hypertension: Secondary | ICD-10-CM | POA: Diagnosis not present

## 2017-03-14 DIAGNOSIS — I483 Typical atrial flutter: Secondary | ICD-10-CM | POA: Diagnosis not present

## 2017-03-14 DIAGNOSIS — I471 Supraventricular tachycardia: Secondary | ICD-10-CM | POA: Diagnosis not present

## 2017-03-14 DIAGNOSIS — J449 Chronic obstructive pulmonary disease, unspecified: Secondary | ICD-10-CM | POA: Diagnosis not present

## 2017-03-14 DIAGNOSIS — A0471 Enterocolitis due to Clostridium difficile, recurrent: Secondary | ICD-10-CM | POA: Diagnosis not present

## 2017-03-16 DIAGNOSIS — A0472 Enterocolitis due to Clostridium difficile, not specified as recurrent: Secondary | ICD-10-CM | POA: Diagnosis not present

## 2017-03-16 DIAGNOSIS — I509 Heart failure, unspecified: Secondary | ICD-10-CM | POA: Diagnosis not present

## 2017-03-16 DIAGNOSIS — Z981 Arthrodesis status: Secondary | ICD-10-CM | POA: Diagnosis not present

## 2017-03-16 DIAGNOSIS — Z8673 Personal history of transient ischemic attack (TIA), and cerebral infarction without residual deficits: Secondary | ICD-10-CM | POA: Diagnosis not present

## 2017-03-16 DIAGNOSIS — I483 Typical atrial flutter: Secondary | ICD-10-CM | POA: Diagnosis not present

## 2017-03-16 DIAGNOSIS — Z681 Body mass index (BMI) 19 or less, adult: Secondary | ICD-10-CM | POA: Diagnosis not present

## 2017-03-16 DIAGNOSIS — I471 Supraventricular tachycardia: Secondary | ICD-10-CM | POA: Diagnosis not present

## 2017-03-16 DIAGNOSIS — I110 Hypertensive heart disease with heart failure: Secondary | ICD-10-CM | POA: Diagnosis not present

## 2017-03-16 DIAGNOSIS — E43 Unspecified severe protein-calorie malnutrition: Secondary | ICD-10-CM | POA: Diagnosis not present

## 2017-03-16 DIAGNOSIS — R0602 Shortness of breath: Secondary | ICD-10-CM | POA: Diagnosis not present

## 2017-03-16 DIAGNOSIS — J189 Pneumonia, unspecified organism: Secondary | ICD-10-CM | POA: Diagnosis not present

## 2017-03-16 DIAGNOSIS — J9601 Acute respiratory failure with hypoxia: Secondary | ICD-10-CM | POA: Diagnosis not present

## 2017-03-16 DIAGNOSIS — M419 Scoliosis, unspecified: Secondary | ICD-10-CM | POA: Diagnosis not present

## 2017-03-16 DIAGNOSIS — S82892A Other fracture of left lower leg, initial encounter for closed fracture: Secondary | ICD-10-CM | POA: Diagnosis not present

## 2017-03-16 DIAGNOSIS — Z888 Allergy status to other drugs, medicaments and biological substances status: Secondary | ICD-10-CM | POA: Diagnosis not present

## 2017-03-16 DIAGNOSIS — I517 Cardiomegaly: Secondary | ICD-10-CM | POA: Diagnosis not present

## 2017-03-16 DIAGNOSIS — R0902 Hypoxemia: Secondary | ICD-10-CM | POA: Diagnosis not present

## 2017-03-16 DIAGNOSIS — I951 Orthostatic hypotension: Secondary | ICD-10-CM | POA: Diagnosis not present

## 2017-03-16 DIAGNOSIS — I10 Essential (primary) hypertension: Secondary | ICD-10-CM | POA: Diagnosis not present

## 2017-03-16 DIAGNOSIS — I482 Chronic atrial fibrillation: Secondary | ICD-10-CM | POA: Diagnosis not present

## 2017-03-16 DIAGNOSIS — R9389 Abnormal findings on diagnostic imaging of other specified body structures: Secondary | ICD-10-CM | POA: Diagnosis not present

## 2017-03-16 DIAGNOSIS — I480 Paroxysmal atrial fibrillation: Secondary | ICD-10-CM | POA: Diagnosis not present

## 2017-03-16 DIAGNOSIS — R069 Unspecified abnormalities of breathing: Secondary | ICD-10-CM | POA: Diagnosis not present

## 2017-03-16 DIAGNOSIS — R531 Weakness: Secondary | ICD-10-CM | POA: Diagnosis not present

## 2017-03-16 DIAGNOSIS — J449 Chronic obstructive pulmonary disease, unspecified: Secondary | ICD-10-CM | POA: Diagnosis not present

## 2017-03-16 DIAGNOSIS — I5023 Acute on chronic systolic (congestive) heart failure: Secondary | ICD-10-CM | POA: Diagnosis not present

## 2017-03-16 DIAGNOSIS — F17210 Nicotine dependence, cigarettes, uncomplicated: Secondary | ICD-10-CM | POA: Diagnosis not present

## 2017-03-16 DIAGNOSIS — R0600 Dyspnea, unspecified: Secondary | ICD-10-CM | POA: Diagnosis not present

## 2017-03-16 DIAGNOSIS — I071 Rheumatic tricuspid insufficiency: Secondary | ICD-10-CM | POA: Diagnosis not present

## 2017-03-22 DIAGNOSIS — I483 Typical atrial flutter: Secondary | ICD-10-CM | POA: Diagnosis not present

## 2017-03-22 DIAGNOSIS — I480 Paroxysmal atrial fibrillation: Secondary | ICD-10-CM | POA: Diagnosis not present

## 2017-03-22 DIAGNOSIS — I10 Essential (primary) hypertension: Secondary | ICD-10-CM | POA: Diagnosis not present

## 2017-03-24 DIAGNOSIS — I471 Supraventricular tachycardia: Secondary | ICD-10-CM | POA: Diagnosis not present

## 2017-03-24 DIAGNOSIS — A0471 Enterocolitis due to Clostridium difficile, recurrent: Secondary | ICD-10-CM | POA: Diagnosis not present

## 2017-03-24 DIAGNOSIS — I10 Essential (primary) hypertension: Secondary | ICD-10-CM | POA: Diagnosis not present

## 2017-03-24 DIAGNOSIS — J449 Chronic obstructive pulmonary disease, unspecified: Secondary | ICD-10-CM | POA: Diagnosis not present

## 2017-03-24 DIAGNOSIS — I480 Paroxysmal atrial fibrillation: Secondary | ICD-10-CM | POA: Diagnosis not present

## 2017-03-24 DIAGNOSIS — I483 Typical atrial flutter: Secondary | ICD-10-CM | POA: Diagnosis not present

## 2017-03-26 DIAGNOSIS — A0471 Enterocolitis due to Clostridium difficile, recurrent: Secondary | ICD-10-CM | POA: Diagnosis not present

## 2017-03-26 DIAGNOSIS — J449 Chronic obstructive pulmonary disease, unspecified: Secondary | ICD-10-CM | POA: Diagnosis not present

## 2017-03-26 DIAGNOSIS — I480 Paroxysmal atrial fibrillation: Secondary | ICD-10-CM | POA: Diagnosis not present

## 2017-03-26 DIAGNOSIS — I10 Essential (primary) hypertension: Secondary | ICD-10-CM | POA: Diagnosis not present

## 2017-03-26 DIAGNOSIS — I471 Supraventricular tachycardia: Secondary | ICD-10-CM | POA: Diagnosis not present

## 2017-03-26 DIAGNOSIS — I483 Typical atrial flutter: Secondary | ICD-10-CM | POA: Diagnosis not present

## 2017-03-27 DIAGNOSIS — I480 Paroxysmal atrial fibrillation: Secondary | ICD-10-CM | POA: Diagnosis not present

## 2017-03-27 DIAGNOSIS — I471 Supraventricular tachycardia: Secondary | ICD-10-CM | POA: Diagnosis not present

## 2017-03-27 DIAGNOSIS — J449 Chronic obstructive pulmonary disease, unspecified: Secondary | ICD-10-CM | POA: Diagnosis not present

## 2017-03-27 DIAGNOSIS — I483 Typical atrial flutter: Secondary | ICD-10-CM | POA: Diagnosis not present

## 2017-03-27 DIAGNOSIS — I509 Heart failure, unspecified: Secondary | ICD-10-CM | POA: Diagnosis not present

## 2017-03-27 DIAGNOSIS — J9601 Acute respiratory failure with hypoxia: Secondary | ICD-10-CM | POA: Diagnosis not present

## 2017-03-27 DIAGNOSIS — R531 Weakness: Secondary | ICD-10-CM | POA: Diagnosis not present

## 2017-03-27 DIAGNOSIS — Z09 Encounter for follow-up examination after completed treatment for conditions other than malignant neoplasm: Secondary | ICD-10-CM | POA: Diagnosis not present

## 2017-03-27 DIAGNOSIS — I10 Essential (primary) hypertension: Secondary | ICD-10-CM | POA: Diagnosis not present

## 2017-03-27 DIAGNOSIS — A0471 Enterocolitis due to Clostridium difficile, recurrent: Secondary | ICD-10-CM | POA: Diagnosis not present

## 2017-03-29 DIAGNOSIS — I471 Supraventricular tachycardia: Secondary | ICD-10-CM | POA: Diagnosis not present

## 2017-03-29 DIAGNOSIS — I10 Essential (primary) hypertension: Secondary | ICD-10-CM | POA: Diagnosis not present

## 2017-03-29 DIAGNOSIS — I482 Chronic atrial fibrillation: Secondary | ICD-10-CM | POA: Diagnosis not present

## 2017-03-29 DIAGNOSIS — I483 Typical atrial flutter: Secondary | ICD-10-CM | POA: Diagnosis not present

## 2017-03-30 DIAGNOSIS — I480 Paroxysmal atrial fibrillation: Secondary | ICD-10-CM | POA: Diagnosis not present

## 2017-03-30 DIAGNOSIS — I10 Essential (primary) hypertension: Secondary | ICD-10-CM | POA: Diagnosis not present

## 2017-03-30 DIAGNOSIS — I471 Supraventricular tachycardia: Secondary | ICD-10-CM | POA: Diagnosis not present

## 2017-03-30 DIAGNOSIS — J449 Chronic obstructive pulmonary disease, unspecified: Secondary | ICD-10-CM | POA: Diagnosis not present

## 2017-03-30 DIAGNOSIS — A0471 Enterocolitis due to Clostridium difficile, recurrent: Secondary | ICD-10-CM | POA: Diagnosis not present

## 2017-03-30 DIAGNOSIS — I483 Typical atrial flutter: Secondary | ICD-10-CM | POA: Diagnosis not present

## 2017-04-04 DIAGNOSIS — I10 Essential (primary) hypertension: Secondary | ICD-10-CM | POA: Diagnosis not present

## 2017-04-04 DIAGNOSIS — I483 Typical atrial flutter: Secondary | ICD-10-CM | POA: Diagnosis not present

## 2017-04-04 DIAGNOSIS — I480 Paroxysmal atrial fibrillation: Secondary | ICD-10-CM | POA: Diagnosis not present

## 2017-04-04 DIAGNOSIS — J449 Chronic obstructive pulmonary disease, unspecified: Secondary | ICD-10-CM | POA: Diagnosis not present

## 2017-04-04 DIAGNOSIS — I471 Supraventricular tachycardia: Secondary | ICD-10-CM | POA: Diagnosis not present

## 2017-04-04 DIAGNOSIS — A0471 Enterocolitis due to Clostridium difficile, recurrent: Secondary | ICD-10-CM | POA: Diagnosis not present

## 2017-04-06 DIAGNOSIS — J449 Chronic obstructive pulmonary disease, unspecified: Secondary | ICD-10-CM | POA: Diagnosis not present

## 2017-04-06 DIAGNOSIS — I471 Supraventricular tachycardia: Secondary | ICD-10-CM | POA: Diagnosis not present

## 2017-04-06 DIAGNOSIS — I483 Typical atrial flutter: Secondary | ICD-10-CM | POA: Diagnosis not present

## 2017-04-06 DIAGNOSIS — I480 Paroxysmal atrial fibrillation: Secondary | ICD-10-CM | POA: Diagnosis not present

## 2017-04-06 DIAGNOSIS — I10 Essential (primary) hypertension: Secondary | ICD-10-CM | POA: Diagnosis not present

## 2017-04-06 DIAGNOSIS — A0471 Enterocolitis due to Clostridium difficile, recurrent: Secondary | ICD-10-CM | POA: Diagnosis not present

## 2017-04-09 DIAGNOSIS — I480 Paroxysmal atrial fibrillation: Secondary | ICD-10-CM | POA: Diagnosis not present

## 2017-04-09 DIAGNOSIS — A0471 Enterocolitis due to Clostridium difficile, recurrent: Secondary | ICD-10-CM | POA: Diagnosis not present

## 2017-04-09 DIAGNOSIS — I10 Essential (primary) hypertension: Secondary | ICD-10-CM | POA: Diagnosis not present

## 2017-04-09 DIAGNOSIS — I483 Typical atrial flutter: Secondary | ICD-10-CM | POA: Diagnosis not present

## 2017-04-09 DIAGNOSIS — I471 Supraventricular tachycardia: Secondary | ICD-10-CM | POA: Diagnosis not present

## 2017-04-09 DIAGNOSIS — J449 Chronic obstructive pulmonary disease, unspecified: Secondary | ICD-10-CM | POA: Diagnosis not present

## 2017-04-10 DIAGNOSIS — A0471 Enterocolitis due to Clostridium difficile, recurrent: Secondary | ICD-10-CM | POA: Diagnosis not present

## 2017-04-10 DIAGNOSIS — J449 Chronic obstructive pulmonary disease, unspecified: Secondary | ICD-10-CM | POA: Diagnosis not present

## 2017-04-10 DIAGNOSIS — I471 Supraventricular tachycardia: Secondary | ICD-10-CM | POA: Diagnosis not present

## 2017-04-10 DIAGNOSIS — I480 Paroxysmal atrial fibrillation: Secondary | ICD-10-CM | POA: Diagnosis not present

## 2017-04-10 DIAGNOSIS — I10 Essential (primary) hypertension: Secondary | ICD-10-CM | POA: Diagnosis not present

## 2017-04-10 DIAGNOSIS — I483 Typical atrial flutter: Secondary | ICD-10-CM | POA: Diagnosis not present

## 2017-04-12 DIAGNOSIS — J449 Chronic obstructive pulmonary disease, unspecified: Secondary | ICD-10-CM | POA: Diagnosis not present

## 2017-04-12 DIAGNOSIS — I480 Paroxysmal atrial fibrillation: Secondary | ICD-10-CM | POA: Diagnosis not present

## 2017-04-12 DIAGNOSIS — I10 Essential (primary) hypertension: Secondary | ICD-10-CM | POA: Diagnosis not present

## 2017-04-12 DIAGNOSIS — A0471 Enterocolitis due to Clostridium difficile, recurrent: Secondary | ICD-10-CM | POA: Diagnosis not present

## 2017-04-12 DIAGNOSIS — I471 Supraventricular tachycardia: Secondary | ICD-10-CM | POA: Diagnosis not present

## 2017-04-12 DIAGNOSIS — I483 Typical atrial flutter: Secondary | ICD-10-CM | POA: Diagnosis not present

## 2017-04-13 DIAGNOSIS — R918 Other nonspecific abnormal finding of lung field: Secondary | ICD-10-CM | POA: Diagnosis not present

## 2017-04-13 DIAGNOSIS — I482 Chronic atrial fibrillation: Secondary | ICD-10-CM | POA: Diagnosis not present

## 2017-04-13 DIAGNOSIS — N281 Cyst of kidney, acquired: Secondary | ICD-10-CM | POA: Diagnosis not present

## 2017-04-13 DIAGNOSIS — K529 Noninfective gastroenteritis and colitis, unspecified: Secondary | ICD-10-CM | POA: Diagnosis not present

## 2017-04-13 DIAGNOSIS — M1711 Unilateral primary osteoarthritis, right knee: Secondary | ICD-10-CM | POA: Diagnosis not present

## 2017-04-13 DIAGNOSIS — R0902 Hypoxemia: Secondary | ICD-10-CM | POA: Diagnosis not present

## 2017-04-13 DIAGNOSIS — R0602 Shortness of breath: Secondary | ICD-10-CM | POA: Diagnosis not present

## 2017-04-13 DIAGNOSIS — Z8673 Personal history of transient ischemic attack (TIA), and cerebral infarction without residual deficits: Secondary | ICD-10-CM | POA: Diagnosis not present

## 2017-04-13 DIAGNOSIS — R9389 Abnormal findings on diagnostic imaging of other specified body structures: Secondary | ICD-10-CM | POA: Diagnosis not present

## 2017-04-13 DIAGNOSIS — R197 Diarrhea, unspecified: Secondary | ICD-10-CM | POA: Diagnosis not present

## 2017-04-13 DIAGNOSIS — M25561 Pain in right knee: Secondary | ICD-10-CM | POA: Diagnosis not present

## 2017-04-13 DIAGNOSIS — I5023 Acute on chronic systolic (congestive) heart failure: Secondary | ICD-10-CM | POA: Diagnosis not present

## 2017-04-13 DIAGNOSIS — I4891 Unspecified atrial fibrillation: Secondary | ICD-10-CM | POA: Diagnosis not present

## 2017-04-13 DIAGNOSIS — M1120 Other chondrocalcinosis, unspecified site: Secondary | ICD-10-CM | POA: Diagnosis not present

## 2017-04-13 DIAGNOSIS — A0472 Enterocolitis due to Clostridium difficile, not specified as recurrent: Secondary | ICD-10-CM | POA: Diagnosis not present

## 2017-04-13 DIAGNOSIS — R109 Unspecified abdominal pain: Secondary | ICD-10-CM | POA: Diagnosis not present

## 2017-04-13 DIAGNOSIS — A0471 Enterocolitis due to Clostridium difficile, recurrent: Secondary | ICD-10-CM | POA: Diagnosis not present

## 2017-04-13 DIAGNOSIS — K921 Melena: Secondary | ICD-10-CM | POA: Diagnosis not present

## 2017-04-13 DIAGNOSIS — Z87891 Personal history of nicotine dependence: Secondary | ICD-10-CM | POA: Diagnosis not present

## 2017-04-13 DIAGNOSIS — S82892A Other fracture of left lower leg, initial encounter for closed fracture: Secondary | ICD-10-CM | POA: Diagnosis not present

## 2017-04-13 DIAGNOSIS — I480 Paroxysmal atrial fibrillation: Secondary | ICD-10-CM | POA: Diagnosis not present

## 2017-04-13 DIAGNOSIS — R000 Tachycardia, unspecified: Secondary | ICD-10-CM | POA: Diagnosis not present

## 2017-04-13 DIAGNOSIS — R531 Weakness: Secondary | ICD-10-CM | POA: Diagnosis not present

## 2017-04-13 DIAGNOSIS — N3000 Acute cystitis without hematuria: Secondary | ICD-10-CM | POA: Diagnosis not present

## 2017-04-13 DIAGNOSIS — I951 Orthostatic hypotension: Secondary | ICD-10-CM | POA: Diagnosis not present

## 2017-04-13 DIAGNOSIS — I10 Essential (primary) hypertension: Secondary | ICD-10-CM | POA: Diagnosis not present

## 2017-04-13 DIAGNOSIS — R05 Cough: Secondary | ICD-10-CM | POA: Diagnosis not present

## 2017-04-13 DIAGNOSIS — M25461 Effusion, right knee: Secondary | ICD-10-CM | POA: Diagnosis not present

## 2017-04-13 DIAGNOSIS — I483 Typical atrial flutter: Secondary | ICD-10-CM | POA: Diagnosis not present

## 2017-04-13 DIAGNOSIS — Z79899 Other long term (current) drug therapy: Secondary | ICD-10-CM | POA: Diagnosis not present

## 2017-04-13 DIAGNOSIS — I509 Heart failure, unspecified: Secondary | ICD-10-CM | POA: Diagnosis not present

## 2017-04-13 DIAGNOSIS — J9611 Chronic respiratory failure with hypoxia: Secondary | ICD-10-CM | POA: Diagnosis not present

## 2017-04-13 DIAGNOSIS — J984 Other disorders of lung: Secondary | ICD-10-CM | POA: Diagnosis not present

## 2017-04-13 DIAGNOSIS — R1084 Generalized abdominal pain: Secondary | ICD-10-CM | POA: Diagnosis not present

## 2017-04-13 DIAGNOSIS — R319 Hematuria, unspecified: Secondary | ICD-10-CM | POA: Diagnosis not present

## 2017-04-13 DIAGNOSIS — E039 Hypothyroidism, unspecified: Secondary | ICD-10-CM | POA: Diagnosis not present

## 2017-04-19 DIAGNOSIS — I480 Paroxysmal atrial fibrillation: Secondary | ICD-10-CM | POA: Diagnosis not present

## 2017-04-19 DIAGNOSIS — I483 Typical atrial flutter: Secondary | ICD-10-CM | POA: Diagnosis not present

## 2017-04-19 DIAGNOSIS — I10 Essential (primary) hypertension: Secondary | ICD-10-CM | POA: Diagnosis not present

## 2017-04-28 DIAGNOSIS — A0471 Enterocolitis due to Clostridium difficile, recurrent: Secondary | ICD-10-CM | POA: Diagnosis not present

## 2017-04-28 DIAGNOSIS — J449 Chronic obstructive pulmonary disease, unspecified: Secondary | ICD-10-CM | POA: Diagnosis not present

## 2017-04-28 DIAGNOSIS — I483 Typical atrial flutter: Secondary | ICD-10-CM | POA: Diagnosis not present

## 2017-04-28 DIAGNOSIS — I10 Essential (primary) hypertension: Secondary | ICD-10-CM | POA: Diagnosis not present

## 2017-04-28 DIAGNOSIS — I480 Paroxysmal atrial fibrillation: Secondary | ICD-10-CM | POA: Diagnosis not present

## 2017-04-28 DIAGNOSIS — I471 Supraventricular tachycardia: Secondary | ICD-10-CM | POA: Diagnosis not present

## 2017-05-01 ENCOUNTER — Other Ambulatory Visit: Payer: Self-pay

## 2017-05-01 DIAGNOSIS — I483 Typical atrial flutter: Secondary | ICD-10-CM | POA: Diagnosis not present

## 2017-05-01 DIAGNOSIS — A0471 Enterocolitis due to Clostridium difficile, recurrent: Secondary | ICD-10-CM | POA: Diagnosis not present

## 2017-05-01 DIAGNOSIS — I471 Supraventricular tachycardia: Secondary | ICD-10-CM | POA: Diagnosis not present

## 2017-05-01 DIAGNOSIS — J449 Chronic obstructive pulmonary disease, unspecified: Secondary | ICD-10-CM | POA: Diagnosis not present

## 2017-05-01 DIAGNOSIS — I480 Paroxysmal atrial fibrillation: Secondary | ICD-10-CM | POA: Diagnosis not present

## 2017-05-01 DIAGNOSIS — I10 Essential (primary) hypertension: Secondary | ICD-10-CM | POA: Diagnosis not present

## 2017-05-01 NOTE — Patient Outreach (Signed)
[place] [facility]) Care Management  05/01/2017  Lindsay Mills 02-23-1946 [id number]   Transition of care  Referral date: 05/01/17 Referral source: discharged from [place] Primary provider: Dr. Lavonia Dana [facility] [place] [facility].  Transition of care will be completed by primary care provider office who will refer patient to [facility] care management if needed.   PLAN: RNCM will refer patient to care management assistant to close due to patient being enrolled in external program.  Quinn Plowman RN,BSN,CCM [facility] Telephonic  [phone]

## 2017-05-02 DIAGNOSIS — J449 Chronic obstructive pulmonary disease, unspecified: Secondary | ICD-10-CM | POA: Diagnosis not present

## 2017-05-02 DIAGNOSIS — I10 Essential (primary) hypertension: Secondary | ICD-10-CM | POA: Diagnosis not present

## 2017-05-02 DIAGNOSIS — I471 Supraventricular tachycardia: Secondary | ICD-10-CM | POA: Diagnosis not present

## 2017-05-02 DIAGNOSIS — A0471 Enterocolitis due to Clostridium difficile, recurrent: Secondary | ICD-10-CM | POA: Diagnosis not present

## 2017-05-02 DIAGNOSIS — I483 Typical atrial flutter: Secondary | ICD-10-CM | POA: Diagnosis not present

## 2017-05-02 DIAGNOSIS — I480 Paroxysmal atrial fibrillation: Secondary | ICD-10-CM | POA: Diagnosis not present

## 2017-05-04 DIAGNOSIS — A0471 Enterocolitis due to Clostridium difficile, recurrent: Secondary | ICD-10-CM | POA: Diagnosis not present

## 2017-05-04 DIAGNOSIS — I10 Essential (primary) hypertension: Secondary | ICD-10-CM | POA: Diagnosis not present

## 2017-05-04 DIAGNOSIS — I483 Typical atrial flutter: Secondary | ICD-10-CM | POA: Diagnosis not present

## 2017-05-04 DIAGNOSIS — I471 Supraventricular tachycardia: Secondary | ICD-10-CM | POA: Diagnosis not present

## 2017-05-04 DIAGNOSIS — I480 Paroxysmal atrial fibrillation: Secondary | ICD-10-CM | POA: Diagnosis not present

## 2017-05-04 DIAGNOSIS — J449 Chronic obstructive pulmonary disease, unspecified: Secondary | ICD-10-CM | POA: Diagnosis not present

## 2017-05-07 DIAGNOSIS — I10 Essential (primary) hypertension: Secondary | ICD-10-CM | POA: Diagnosis not present

## 2017-05-07 DIAGNOSIS — J449 Chronic obstructive pulmonary disease, unspecified: Secondary | ICD-10-CM | POA: Diagnosis not present

## 2017-05-07 DIAGNOSIS — I483 Typical atrial flutter: Secondary | ICD-10-CM | POA: Diagnosis not present

## 2017-05-07 DIAGNOSIS — I471 Supraventricular tachycardia: Secondary | ICD-10-CM | POA: Diagnosis not present

## 2017-05-07 DIAGNOSIS — A0472 Enterocolitis due to Clostridium difficile, not specified as recurrent: Secondary | ICD-10-CM | POA: Diagnosis not present

## 2017-05-07 DIAGNOSIS — I480 Paroxysmal atrial fibrillation: Secondary | ICD-10-CM | POA: Diagnosis not present

## 2017-05-09 DIAGNOSIS — J449 Chronic obstructive pulmonary disease, unspecified: Secondary | ICD-10-CM | POA: Diagnosis not present

## 2017-05-09 DIAGNOSIS — I480 Paroxysmal atrial fibrillation: Secondary | ICD-10-CM | POA: Diagnosis not present

## 2017-05-09 DIAGNOSIS — I10 Essential (primary) hypertension: Secondary | ICD-10-CM | POA: Diagnosis not present

## 2017-05-09 DIAGNOSIS — I483 Typical atrial flutter: Secondary | ICD-10-CM | POA: Diagnosis not present

## 2017-05-09 DIAGNOSIS — I471 Supraventricular tachycardia: Secondary | ICD-10-CM | POA: Diagnosis not present

## 2017-05-09 DIAGNOSIS — A0472 Enterocolitis due to Clostridium difficile, not specified as recurrent: Secondary | ICD-10-CM | POA: Diagnosis not present

## 2017-05-10 DIAGNOSIS — A0472 Enterocolitis due to Clostridium difficile, not specified as recurrent: Secondary | ICD-10-CM | POA: Diagnosis not present

## 2017-05-10 DIAGNOSIS — Z86010 Personal history of colonic polyps: Secondary | ICD-10-CM | POA: Diagnosis not present

## 2017-05-11 DIAGNOSIS — A0472 Enterocolitis due to Clostridium difficile, not specified as recurrent: Secondary | ICD-10-CM | POA: Diagnosis not present

## 2017-05-11 DIAGNOSIS — I483 Typical atrial flutter: Secondary | ICD-10-CM | POA: Diagnosis not present

## 2017-05-11 DIAGNOSIS — J449 Chronic obstructive pulmonary disease, unspecified: Secondary | ICD-10-CM | POA: Diagnosis not present

## 2017-05-11 DIAGNOSIS — I10 Essential (primary) hypertension: Secondary | ICD-10-CM | POA: Diagnosis not present

## 2017-05-11 DIAGNOSIS — I471 Supraventricular tachycardia: Secondary | ICD-10-CM | POA: Diagnosis not present

## 2017-05-11 DIAGNOSIS — I480 Paroxysmal atrial fibrillation: Secondary | ICD-10-CM | POA: Diagnosis not present

## 2017-05-14 DIAGNOSIS — I10 Essential (primary) hypertension: Secondary | ICD-10-CM | POA: Diagnosis not present

## 2017-05-14 DIAGNOSIS — I471 Supraventricular tachycardia: Secondary | ICD-10-CM | POA: Diagnosis not present

## 2017-05-14 DIAGNOSIS — I480 Paroxysmal atrial fibrillation: Secondary | ICD-10-CM | POA: Diagnosis not present

## 2017-05-14 DIAGNOSIS — J449 Chronic obstructive pulmonary disease, unspecified: Secondary | ICD-10-CM | POA: Diagnosis not present

## 2017-05-14 DIAGNOSIS — I483 Typical atrial flutter: Secondary | ICD-10-CM | POA: Diagnosis not present

## 2017-05-14 DIAGNOSIS — A0472 Enterocolitis due to Clostridium difficile, not specified as recurrent: Secondary | ICD-10-CM | POA: Diagnosis not present

## 2017-05-16 DIAGNOSIS — I471 Supraventricular tachycardia: Secondary | ICD-10-CM | POA: Diagnosis not present

## 2017-05-16 DIAGNOSIS — A0472 Enterocolitis due to Clostridium difficile, not specified as recurrent: Secondary | ICD-10-CM | POA: Diagnosis not present

## 2017-05-16 DIAGNOSIS — I480 Paroxysmal atrial fibrillation: Secondary | ICD-10-CM | POA: Diagnosis not present

## 2017-05-16 DIAGNOSIS — I10 Essential (primary) hypertension: Secondary | ICD-10-CM | POA: Diagnosis not present

## 2017-05-16 DIAGNOSIS — I483 Typical atrial flutter: Secondary | ICD-10-CM | POA: Diagnosis not present

## 2017-05-16 DIAGNOSIS — J449 Chronic obstructive pulmonary disease, unspecified: Secondary | ICD-10-CM | POA: Diagnosis not present

## 2017-05-17 DIAGNOSIS — I471 Supraventricular tachycardia: Secondary | ICD-10-CM | POA: Diagnosis not present

## 2017-05-17 DIAGNOSIS — J449 Chronic obstructive pulmonary disease, unspecified: Secondary | ICD-10-CM | POA: Diagnosis not present

## 2017-05-17 DIAGNOSIS — I483 Typical atrial flutter: Secondary | ICD-10-CM | POA: Diagnosis not present

## 2017-05-17 DIAGNOSIS — I10 Essential (primary) hypertension: Secondary | ICD-10-CM | POA: Diagnosis not present

## 2017-05-17 DIAGNOSIS — I480 Paroxysmal atrial fibrillation: Secondary | ICD-10-CM | POA: Diagnosis not present

## 2017-05-17 DIAGNOSIS — A0472 Enterocolitis due to Clostridium difficile, not specified as recurrent: Secondary | ICD-10-CM | POA: Diagnosis not present

## 2017-05-21 DIAGNOSIS — E039 Hypothyroidism, unspecified: Secondary | ICD-10-CM | POA: Diagnosis not present

## 2017-05-21 DIAGNOSIS — I483 Typical atrial flutter: Secondary | ICD-10-CM | POA: Diagnosis not present

## 2017-05-21 DIAGNOSIS — I482 Chronic atrial fibrillation: Secondary | ICD-10-CM | POA: Diagnosis not present

## 2017-05-21 DIAGNOSIS — I5023 Acute on chronic systolic (congestive) heart failure: Secondary | ICD-10-CM | POA: Diagnosis not present

## 2017-05-21 DIAGNOSIS — F329 Major depressive disorder, single episode, unspecified: Secondary | ICD-10-CM | POA: Diagnosis not present

## 2017-05-21 DIAGNOSIS — R1084 Generalized abdominal pain: Secondary | ICD-10-CM | POA: Diagnosis not present

## 2017-05-21 DIAGNOSIS — A0472 Enterocolitis due to Clostridium difficile, not specified as recurrent: Secondary | ICD-10-CM | POA: Diagnosis not present

## 2017-05-21 DIAGNOSIS — M1990 Unspecified osteoarthritis, unspecified site: Secondary | ICD-10-CM | POA: Diagnosis not present

## 2017-05-21 DIAGNOSIS — I509 Heart failure, unspecified: Secondary | ICD-10-CM | POA: Diagnosis not present

## 2017-05-21 DIAGNOSIS — A0471 Enterocolitis due to Clostridium difficile, recurrent: Secondary | ICD-10-CM | POA: Diagnosis not present

## 2017-05-21 DIAGNOSIS — I10 Essential (primary) hypertension: Secondary | ICD-10-CM | POA: Diagnosis not present

## 2017-05-21 DIAGNOSIS — S82892A Other fracture of left lower leg, initial encounter for closed fracture: Secondary | ICD-10-CM | POA: Diagnosis not present

## 2017-05-21 DIAGNOSIS — I481 Persistent atrial fibrillation: Secondary | ICD-10-CM | POA: Diagnosis not present

## 2017-05-21 DIAGNOSIS — I480 Paroxysmal atrial fibrillation: Secondary | ICD-10-CM | POA: Diagnosis not present

## 2017-05-21 DIAGNOSIS — R197 Diarrhea, unspecified: Secondary | ICD-10-CM | POA: Diagnosis not present

## 2017-05-21 DIAGNOSIS — I5022 Chronic systolic (congestive) heart failure: Secondary | ICD-10-CM | POA: Diagnosis not present

## 2017-05-21 DIAGNOSIS — I5020 Unspecified systolic (congestive) heart failure: Secondary | ICD-10-CM | POA: Diagnosis not present

## 2017-05-21 DIAGNOSIS — R55 Syncope and collapse: Secondary | ICD-10-CM | POA: Diagnosis not present

## 2017-05-21 DIAGNOSIS — R110 Nausea: Secondary | ICD-10-CM | POA: Diagnosis not present

## 2017-05-21 DIAGNOSIS — G40909 Epilepsy, unspecified, not intractable, without status epilepticus: Secondary | ICD-10-CM | POA: Diagnosis not present

## 2017-05-21 DIAGNOSIS — Z86010 Personal history of colonic polyps: Secondary | ICD-10-CM | POA: Diagnosis not present

## 2017-05-21 DIAGNOSIS — Z0100 Encounter for examination of eyes and vision without abnormal findings: Secondary | ICD-10-CM | POA: Diagnosis not present

## 2017-05-21 DIAGNOSIS — Z87891 Personal history of nicotine dependence: Secondary | ICD-10-CM | POA: Diagnosis not present

## 2017-05-21 DIAGNOSIS — I951 Orthostatic hypotension: Secondary | ICD-10-CM | POA: Diagnosis not present

## 2017-05-21 DIAGNOSIS — I471 Supraventricular tachycardia: Secondary | ICD-10-CM | POA: Diagnosis not present

## 2017-05-21 DIAGNOSIS — J449 Chronic obstructive pulmonary disease, unspecified: Secondary | ICD-10-CM | POA: Diagnosis not present

## 2017-05-22 DIAGNOSIS — I10 Essential (primary) hypertension: Secondary | ICD-10-CM | POA: Diagnosis not present

## 2017-05-22 DIAGNOSIS — I480 Paroxysmal atrial fibrillation: Secondary | ICD-10-CM | POA: Diagnosis not present

## 2017-05-22 DIAGNOSIS — I471 Supraventricular tachycardia: Secondary | ICD-10-CM | POA: Diagnosis not present

## 2017-05-22 DIAGNOSIS — I483 Typical atrial flutter: Secondary | ICD-10-CM | POA: Diagnosis not present

## 2017-05-22 DIAGNOSIS — J449 Chronic obstructive pulmonary disease, unspecified: Secondary | ICD-10-CM | POA: Diagnosis not present

## 2017-05-22 DIAGNOSIS — A0472 Enterocolitis due to Clostridium difficile, not specified as recurrent: Secondary | ICD-10-CM | POA: Diagnosis not present

## 2017-05-23 DIAGNOSIS — R55 Syncope and collapse: Secondary | ICD-10-CM | POA: Diagnosis not present

## 2017-05-30 DIAGNOSIS — M419 Scoliosis, unspecified: Secondary | ICD-10-CM | POA: Diagnosis not present

## 2017-05-30 DIAGNOSIS — J986 Disorders of diaphragm: Secondary | ICD-10-CM | POA: Diagnosis not present

## 2017-05-30 DIAGNOSIS — R0602 Shortness of breath: Secondary | ICD-10-CM | POA: Diagnosis not present

## 2017-05-30 DIAGNOSIS — I4891 Unspecified atrial fibrillation: Secondary | ICD-10-CM | POA: Diagnosis not present

## 2017-06-01 DIAGNOSIS — I483 Typical atrial flutter: Secondary | ICD-10-CM | POA: Diagnosis not present

## 2017-06-01 DIAGNOSIS — I10 Essential (primary) hypertension: Secondary | ICD-10-CM | POA: Diagnosis not present

## 2017-06-01 DIAGNOSIS — J449 Chronic obstructive pulmonary disease, unspecified: Secondary | ICD-10-CM | POA: Diagnosis not present

## 2017-06-01 DIAGNOSIS — I480 Paroxysmal atrial fibrillation: Secondary | ICD-10-CM | POA: Diagnosis not present

## 2017-06-01 DIAGNOSIS — I471 Supraventricular tachycardia: Secondary | ICD-10-CM | POA: Diagnosis not present

## 2017-06-01 DIAGNOSIS — A0472 Enterocolitis due to Clostridium difficile, not specified as recurrent: Secondary | ICD-10-CM | POA: Diagnosis not present

## 2017-06-04 DIAGNOSIS — J449 Chronic obstructive pulmonary disease, unspecified: Secondary | ICD-10-CM | POA: Diagnosis not present

## 2017-06-04 DIAGNOSIS — I480 Paroxysmal atrial fibrillation: Secondary | ICD-10-CM | POA: Diagnosis not present

## 2017-06-04 DIAGNOSIS — A0472 Enterocolitis due to Clostridium difficile, not specified as recurrent: Secondary | ICD-10-CM | POA: Diagnosis not present

## 2017-06-04 DIAGNOSIS — I471 Supraventricular tachycardia: Secondary | ICD-10-CM | POA: Diagnosis not present

## 2017-06-04 DIAGNOSIS — I10 Essential (primary) hypertension: Secondary | ICD-10-CM | POA: Diagnosis not present

## 2017-06-04 DIAGNOSIS — I483 Typical atrial flutter: Secondary | ICD-10-CM | POA: Diagnosis not present

## 2017-06-05 DIAGNOSIS — M810 Age-related osteoporosis without current pathological fracture: Secondary | ICD-10-CM | POA: Diagnosis not present

## 2017-06-06 DIAGNOSIS — A0472 Enterocolitis due to Clostridium difficile, not specified as recurrent: Secondary | ICD-10-CM | POA: Diagnosis not present

## 2017-06-06 DIAGNOSIS — J449 Chronic obstructive pulmonary disease, unspecified: Secondary | ICD-10-CM | POA: Diagnosis not present

## 2017-06-06 DIAGNOSIS — I483 Typical atrial flutter: Secondary | ICD-10-CM | POA: Diagnosis not present

## 2017-06-06 DIAGNOSIS — I471 Supraventricular tachycardia: Secondary | ICD-10-CM | POA: Diagnosis not present

## 2017-06-06 DIAGNOSIS — I10 Essential (primary) hypertension: Secondary | ICD-10-CM | POA: Diagnosis not present

## 2017-06-06 DIAGNOSIS — I480 Paroxysmal atrial fibrillation: Secondary | ICD-10-CM | POA: Diagnosis not present

## 2017-06-07 DIAGNOSIS — J449 Chronic obstructive pulmonary disease, unspecified: Secondary | ICD-10-CM | POA: Diagnosis not present

## 2017-06-07 DIAGNOSIS — I480 Paroxysmal atrial fibrillation: Secondary | ICD-10-CM | POA: Diagnosis not present

## 2017-06-07 DIAGNOSIS — I10 Essential (primary) hypertension: Secondary | ICD-10-CM | POA: Diagnosis not present

## 2017-06-07 DIAGNOSIS — I483 Typical atrial flutter: Secondary | ICD-10-CM | POA: Diagnosis not present

## 2017-06-07 DIAGNOSIS — I471 Supraventricular tachycardia: Secondary | ICD-10-CM | POA: Diagnosis not present

## 2017-06-07 DIAGNOSIS — A0472 Enterocolitis due to Clostridium difficile, not specified as recurrent: Secondary | ICD-10-CM | POA: Diagnosis not present

## 2017-06-08 DIAGNOSIS — Z681 Body mass index (BMI) 19 or less, adult: Secondary | ICD-10-CM | POA: Diagnosis not present

## 2017-06-08 DIAGNOSIS — Z01419 Encounter for gynecological examination (general) (routine) without abnormal findings: Secondary | ICD-10-CM | POA: Diagnosis not present

## 2017-06-11 DIAGNOSIS — I483 Typical atrial flutter: Secondary | ICD-10-CM | POA: Diagnosis not present

## 2017-06-11 DIAGNOSIS — I10 Essential (primary) hypertension: Secondary | ICD-10-CM | POA: Diagnosis not present

## 2017-06-11 DIAGNOSIS — I480 Paroxysmal atrial fibrillation: Secondary | ICD-10-CM | POA: Diagnosis not present

## 2017-06-11 DIAGNOSIS — J449 Chronic obstructive pulmonary disease, unspecified: Secondary | ICD-10-CM | POA: Diagnosis not present

## 2017-06-11 DIAGNOSIS — I471 Supraventricular tachycardia: Secondary | ICD-10-CM | POA: Diagnosis not present

## 2017-06-11 DIAGNOSIS — A0472 Enterocolitis due to Clostridium difficile, not specified as recurrent: Secondary | ICD-10-CM | POA: Diagnosis not present

## 2017-06-12 DIAGNOSIS — R7303 Prediabetes: Secondary | ICD-10-CM | POA: Diagnosis not present

## 2017-06-12 DIAGNOSIS — N390 Urinary tract infection, site not specified: Secondary | ICD-10-CM | POA: Diagnosis not present

## 2017-06-12 DIAGNOSIS — R319 Hematuria, unspecified: Secondary | ICD-10-CM | POA: Diagnosis not present

## 2017-06-12 DIAGNOSIS — Z0000 Encounter for general adult medical examination without abnormal findings: Secondary | ICD-10-CM | POA: Diagnosis not present

## 2017-06-12 DIAGNOSIS — J449 Chronic obstructive pulmonary disease, unspecified: Secondary | ICD-10-CM | POA: Diagnosis not present

## 2017-06-12 DIAGNOSIS — E039 Hypothyroidism, unspecified: Secondary | ICD-10-CM | POA: Diagnosis not present

## 2017-06-12 DIAGNOSIS — E782 Mixed hyperlipidemia: Secondary | ICD-10-CM | POA: Diagnosis not present

## 2017-06-13 DIAGNOSIS — A0472 Enterocolitis due to Clostridium difficile, not specified as recurrent: Secondary | ICD-10-CM | POA: Diagnosis not present

## 2017-06-13 DIAGNOSIS — I471 Supraventricular tachycardia: Secondary | ICD-10-CM | POA: Diagnosis not present

## 2017-06-13 DIAGNOSIS — J449 Chronic obstructive pulmonary disease, unspecified: Secondary | ICD-10-CM | POA: Diagnosis not present

## 2017-06-13 DIAGNOSIS — I480 Paroxysmal atrial fibrillation: Secondary | ICD-10-CM | POA: Diagnosis not present

## 2017-06-13 DIAGNOSIS — I10 Essential (primary) hypertension: Secondary | ICD-10-CM | POA: Diagnosis not present

## 2017-06-13 DIAGNOSIS — I483 Typical atrial flutter: Secondary | ICD-10-CM | POA: Diagnosis not present

## 2017-06-19 DIAGNOSIS — R55 Syncope and collapse: Secondary | ICD-10-CM | POA: Diagnosis not present

## 2017-06-19 DIAGNOSIS — M810 Age-related osteoporosis without current pathological fracture: Secondary | ICD-10-CM | POA: Diagnosis not present

## 2017-06-19 DIAGNOSIS — I480 Paroxysmal atrial fibrillation: Secondary | ICD-10-CM | POA: Diagnosis not present

## 2017-06-19 DIAGNOSIS — G40909 Epilepsy, unspecified, not intractable, without status epilepticus: Secondary | ICD-10-CM | POA: Diagnosis not present

## 2017-06-19 DIAGNOSIS — E782 Mixed hyperlipidemia: Secondary | ICD-10-CM | POA: Diagnosis not present

## 2017-06-19 DIAGNOSIS — E039 Hypothyroidism, unspecified: Secondary | ICD-10-CM | POA: Diagnosis not present

## 2017-06-19 DIAGNOSIS — I6930 Unspecified sequelae of cerebral infarction: Secondary | ICD-10-CM | POA: Diagnosis not present

## 2017-06-19 DIAGNOSIS — R7303 Prediabetes: Secondary | ICD-10-CM | POA: Diagnosis not present

## 2017-06-19 DIAGNOSIS — J449 Chronic obstructive pulmonary disease, unspecified: Secondary | ICD-10-CM | POA: Diagnosis not present

## 2017-06-20 DIAGNOSIS — S82892A Other fracture of left lower leg, initial encounter for closed fracture: Secondary | ICD-10-CM | POA: Diagnosis not present

## 2017-06-20 DIAGNOSIS — I509 Heart failure, unspecified: Secondary | ICD-10-CM | POA: Diagnosis not present

## 2017-06-20 DIAGNOSIS — I951 Orthostatic hypotension: Secondary | ICD-10-CM | POA: Diagnosis not present

## 2017-06-20 DIAGNOSIS — I5023 Acute on chronic systolic (congestive) heart failure: Secondary | ICD-10-CM | POA: Diagnosis not present

## 2017-06-21 DIAGNOSIS — I483 Typical atrial flutter: Secondary | ICD-10-CM | POA: Diagnosis not present

## 2017-06-21 DIAGNOSIS — J449 Chronic obstructive pulmonary disease, unspecified: Secondary | ICD-10-CM | POA: Diagnosis not present

## 2017-06-21 DIAGNOSIS — I10 Essential (primary) hypertension: Secondary | ICD-10-CM | POA: Diagnosis not present

## 2017-06-21 DIAGNOSIS — I471 Supraventricular tachycardia: Secondary | ICD-10-CM | POA: Diagnosis not present

## 2017-06-21 DIAGNOSIS — I480 Paroxysmal atrial fibrillation: Secondary | ICD-10-CM | POA: Diagnosis not present

## 2017-06-21 DIAGNOSIS — A0472 Enterocolitis due to Clostridium difficile, not specified as recurrent: Secondary | ICD-10-CM | POA: Diagnosis not present

## 2017-06-22 DIAGNOSIS — A0472 Enterocolitis due to Clostridium difficile, not specified as recurrent: Secondary | ICD-10-CM | POA: Diagnosis not present

## 2017-06-22 DIAGNOSIS — I471 Supraventricular tachycardia: Secondary | ICD-10-CM | POA: Diagnosis not present

## 2017-06-22 DIAGNOSIS — I10 Essential (primary) hypertension: Secondary | ICD-10-CM | POA: Diagnosis not present

## 2017-06-22 DIAGNOSIS — I480 Paroxysmal atrial fibrillation: Secondary | ICD-10-CM | POA: Diagnosis not present

## 2017-06-22 DIAGNOSIS — I483 Typical atrial flutter: Secondary | ICD-10-CM | POA: Diagnosis not present

## 2017-06-22 DIAGNOSIS — J449 Chronic obstructive pulmonary disease, unspecified: Secondary | ICD-10-CM | POA: Diagnosis not present

## 2017-06-27 DIAGNOSIS — I483 Typical atrial flutter: Secondary | ICD-10-CM | POA: Diagnosis not present

## 2017-06-27 DIAGNOSIS — I10 Essential (primary) hypertension: Secondary | ICD-10-CM | POA: Diagnosis not present

## 2017-06-27 DIAGNOSIS — I471 Supraventricular tachycardia: Secondary | ICD-10-CM | POA: Diagnosis not present

## 2017-06-27 DIAGNOSIS — I480 Paroxysmal atrial fibrillation: Secondary | ICD-10-CM | POA: Diagnosis not present

## 2017-06-27 DIAGNOSIS — J449 Chronic obstructive pulmonary disease, unspecified: Secondary | ICD-10-CM | POA: Diagnosis not present

## 2017-06-27 DIAGNOSIS — A0472 Enterocolitis due to Clostridium difficile, not specified as recurrent: Secondary | ICD-10-CM | POA: Diagnosis not present

## 2017-07-02 DIAGNOSIS — I471 Supraventricular tachycardia: Secondary | ICD-10-CM | POA: Diagnosis not present

## 2017-07-02 DIAGNOSIS — I480 Paroxysmal atrial fibrillation: Secondary | ICD-10-CM | POA: Diagnosis not present

## 2017-07-02 DIAGNOSIS — J449 Chronic obstructive pulmonary disease, unspecified: Secondary | ICD-10-CM | POA: Diagnosis not present

## 2017-07-02 DIAGNOSIS — A0472 Enterocolitis due to Clostridium difficile, not specified as recurrent: Secondary | ICD-10-CM | POA: Diagnosis not present

## 2017-07-02 DIAGNOSIS — I10 Essential (primary) hypertension: Secondary | ICD-10-CM | POA: Diagnosis not present

## 2017-07-02 DIAGNOSIS — I483 Typical atrial flutter: Secondary | ICD-10-CM | POA: Diagnosis not present

## 2017-07-03 DIAGNOSIS — A0472 Enterocolitis due to Clostridium difficile, not specified as recurrent: Secondary | ICD-10-CM | POA: Diagnosis not present

## 2017-07-03 DIAGNOSIS — I480 Paroxysmal atrial fibrillation: Secondary | ICD-10-CM | POA: Diagnosis not present

## 2017-07-03 DIAGNOSIS — I10 Essential (primary) hypertension: Secondary | ICD-10-CM | POA: Diagnosis not present

## 2017-07-03 DIAGNOSIS — I471 Supraventricular tachycardia: Secondary | ICD-10-CM | POA: Diagnosis not present

## 2017-07-03 DIAGNOSIS — J449 Chronic obstructive pulmonary disease, unspecified: Secondary | ICD-10-CM | POA: Diagnosis not present

## 2017-07-03 DIAGNOSIS — I483 Typical atrial flutter: Secondary | ICD-10-CM | POA: Diagnosis not present

## 2017-07-10 DIAGNOSIS — A0472 Enterocolitis due to Clostridium difficile, not specified as recurrent: Secondary | ICD-10-CM | POA: Diagnosis not present

## 2017-07-11 DIAGNOSIS — I471 Supraventricular tachycardia: Secondary | ICD-10-CM | POA: Diagnosis not present

## 2017-07-11 DIAGNOSIS — J441 Chronic obstructive pulmonary disease with (acute) exacerbation: Secondary | ICD-10-CM | POA: Diagnosis not present

## 2017-07-11 DIAGNOSIS — I480 Paroxysmal atrial fibrillation: Secondary | ICD-10-CM | POA: Diagnosis not present

## 2017-07-11 DIAGNOSIS — I483 Typical atrial flutter: Secondary | ICD-10-CM | POA: Diagnosis not present

## 2017-07-11 DIAGNOSIS — I10 Essential (primary) hypertension: Secondary | ICD-10-CM | POA: Diagnosis not present

## 2017-07-11 DIAGNOSIS — E782 Mixed hyperlipidemia: Secondary | ICD-10-CM | POA: Diagnosis not present

## 2017-07-12 DIAGNOSIS — I483 Typical atrial flutter: Secondary | ICD-10-CM | POA: Diagnosis not present

## 2017-07-12 DIAGNOSIS — I10 Essential (primary) hypertension: Secondary | ICD-10-CM | POA: Diagnosis not present

## 2017-07-12 DIAGNOSIS — I480 Paroxysmal atrial fibrillation: Secondary | ICD-10-CM | POA: Diagnosis not present

## 2017-07-12 DIAGNOSIS — J441 Chronic obstructive pulmonary disease with (acute) exacerbation: Secondary | ICD-10-CM | POA: Diagnosis not present

## 2017-07-13 DIAGNOSIS — I471 Supraventricular tachycardia: Secondary | ICD-10-CM | POA: Diagnosis not present

## 2017-07-13 DIAGNOSIS — E782 Mixed hyperlipidemia: Secondary | ICD-10-CM | POA: Diagnosis not present

## 2017-07-13 DIAGNOSIS — I483 Typical atrial flutter: Secondary | ICD-10-CM | POA: Diagnosis not present

## 2017-07-13 DIAGNOSIS — I480 Paroxysmal atrial fibrillation: Secondary | ICD-10-CM | POA: Diagnosis not present

## 2017-07-13 DIAGNOSIS — I10 Essential (primary) hypertension: Secondary | ICD-10-CM | POA: Diagnosis not present

## 2017-07-13 DIAGNOSIS — J441 Chronic obstructive pulmonary disease with (acute) exacerbation: Secondary | ICD-10-CM | POA: Diagnosis not present

## 2017-07-16 DIAGNOSIS — R319 Hematuria, unspecified: Secondary | ICD-10-CM | POA: Diagnosis not present

## 2017-07-16 DIAGNOSIS — N390 Urinary tract infection, site not specified: Secondary | ICD-10-CM | POA: Diagnosis not present

## 2017-07-20 DIAGNOSIS — I480 Paroxysmal atrial fibrillation: Secondary | ICD-10-CM | POA: Diagnosis not present

## 2017-07-20 DIAGNOSIS — I471 Supraventricular tachycardia: Secondary | ICD-10-CM | POA: Diagnosis not present

## 2017-07-20 DIAGNOSIS — E782 Mixed hyperlipidemia: Secondary | ICD-10-CM | POA: Diagnosis not present

## 2017-07-20 DIAGNOSIS — I10 Essential (primary) hypertension: Secondary | ICD-10-CM | POA: Diagnosis not present

## 2017-07-20 DIAGNOSIS — J441 Chronic obstructive pulmonary disease with (acute) exacerbation: Secondary | ICD-10-CM | POA: Diagnosis not present

## 2017-07-20 DIAGNOSIS — I483 Typical atrial flutter: Secondary | ICD-10-CM | POA: Diagnosis not present

## 2017-07-21 DIAGNOSIS — I509 Heart failure, unspecified: Secondary | ICD-10-CM | POA: Diagnosis not present

## 2017-07-21 DIAGNOSIS — I5023 Acute on chronic systolic (congestive) heart failure: Secondary | ICD-10-CM | POA: Diagnosis not present

## 2017-07-21 DIAGNOSIS — I951 Orthostatic hypotension: Secondary | ICD-10-CM | POA: Diagnosis not present

## 2017-07-21 DIAGNOSIS — S82892A Other fracture of left lower leg, initial encounter for closed fracture: Secondary | ICD-10-CM | POA: Diagnosis not present

## 2017-07-25 DIAGNOSIS — I471 Supraventricular tachycardia: Secondary | ICD-10-CM | POA: Diagnosis not present

## 2017-07-25 DIAGNOSIS — I483 Typical atrial flutter: Secondary | ICD-10-CM | POA: Diagnosis not present

## 2017-07-25 DIAGNOSIS — J441 Chronic obstructive pulmonary disease with (acute) exacerbation: Secondary | ICD-10-CM | POA: Diagnosis not present

## 2017-07-25 DIAGNOSIS — E782 Mixed hyperlipidemia: Secondary | ICD-10-CM | POA: Diagnosis not present

## 2017-07-25 DIAGNOSIS — I10 Essential (primary) hypertension: Secondary | ICD-10-CM | POA: Diagnosis not present

## 2017-07-25 DIAGNOSIS — I480 Paroxysmal atrial fibrillation: Secondary | ICD-10-CM | POA: Diagnosis not present

## 2017-07-27 DIAGNOSIS — I471 Supraventricular tachycardia: Secondary | ICD-10-CM | POA: Diagnosis not present

## 2017-07-27 DIAGNOSIS — I483 Typical atrial flutter: Secondary | ICD-10-CM | POA: Diagnosis not present

## 2017-07-27 DIAGNOSIS — I10 Essential (primary) hypertension: Secondary | ICD-10-CM | POA: Diagnosis not present

## 2017-07-27 DIAGNOSIS — J441 Chronic obstructive pulmonary disease with (acute) exacerbation: Secondary | ICD-10-CM | POA: Diagnosis not present

## 2017-07-27 DIAGNOSIS — I480 Paroxysmal atrial fibrillation: Secondary | ICD-10-CM | POA: Diagnosis not present

## 2017-07-27 DIAGNOSIS — E782 Mixed hyperlipidemia: Secondary | ICD-10-CM | POA: Diagnosis not present

## 2017-08-02 DIAGNOSIS — I483 Typical atrial flutter: Secondary | ICD-10-CM | POA: Diagnosis not present

## 2017-08-02 DIAGNOSIS — E782 Mixed hyperlipidemia: Secondary | ICD-10-CM | POA: Diagnosis not present

## 2017-08-02 DIAGNOSIS — I471 Supraventricular tachycardia: Secondary | ICD-10-CM | POA: Diagnosis not present

## 2017-08-02 DIAGNOSIS — J441 Chronic obstructive pulmonary disease with (acute) exacerbation: Secondary | ICD-10-CM | POA: Diagnosis not present

## 2017-08-02 DIAGNOSIS — I480 Paroxysmal atrial fibrillation: Secondary | ICD-10-CM | POA: Diagnosis not present

## 2017-08-02 DIAGNOSIS — I10 Essential (primary) hypertension: Secondary | ICD-10-CM | POA: Diagnosis not present

## 2017-08-03 DIAGNOSIS — I471 Supraventricular tachycardia: Secondary | ICD-10-CM | POA: Diagnosis not present

## 2017-08-03 DIAGNOSIS — I480 Paroxysmal atrial fibrillation: Secondary | ICD-10-CM | POA: Diagnosis not present

## 2017-08-03 DIAGNOSIS — J441 Chronic obstructive pulmonary disease with (acute) exacerbation: Secondary | ICD-10-CM | POA: Diagnosis not present

## 2017-08-03 DIAGNOSIS — I10 Essential (primary) hypertension: Secondary | ICD-10-CM | POA: Diagnosis not present

## 2017-08-03 DIAGNOSIS — E782 Mixed hyperlipidemia: Secondary | ICD-10-CM | POA: Diagnosis not present

## 2017-08-03 DIAGNOSIS — I483 Typical atrial flutter: Secondary | ICD-10-CM | POA: Diagnosis not present

## 2017-08-06 DIAGNOSIS — E782 Mixed hyperlipidemia: Secondary | ICD-10-CM | POA: Diagnosis not present

## 2017-08-06 DIAGNOSIS — I471 Supraventricular tachycardia: Secondary | ICD-10-CM | POA: Diagnosis not present

## 2017-08-06 DIAGNOSIS — I10 Essential (primary) hypertension: Secondary | ICD-10-CM | POA: Diagnosis not present

## 2017-08-06 DIAGNOSIS — J441 Chronic obstructive pulmonary disease with (acute) exacerbation: Secondary | ICD-10-CM | POA: Diagnosis not present

## 2017-08-06 DIAGNOSIS — I480 Paroxysmal atrial fibrillation: Secondary | ICD-10-CM | POA: Diagnosis not present

## 2017-08-06 DIAGNOSIS — I483 Typical atrial flutter: Secondary | ICD-10-CM | POA: Diagnosis not present

## 2017-08-09 DIAGNOSIS — I471 Supraventricular tachycardia: Secondary | ICD-10-CM | POA: Diagnosis not present

## 2017-08-09 DIAGNOSIS — I10 Essential (primary) hypertension: Secondary | ICD-10-CM | POA: Diagnosis not present

## 2017-08-09 DIAGNOSIS — I480 Paroxysmal atrial fibrillation: Secondary | ICD-10-CM | POA: Diagnosis not present

## 2017-08-09 DIAGNOSIS — J441 Chronic obstructive pulmonary disease with (acute) exacerbation: Secondary | ICD-10-CM | POA: Diagnosis not present

## 2017-08-09 DIAGNOSIS — I483 Typical atrial flutter: Secondary | ICD-10-CM | POA: Diagnosis not present

## 2017-08-09 DIAGNOSIS — E782 Mixed hyperlipidemia: Secondary | ICD-10-CM | POA: Diagnosis not present

## 2017-08-13 DIAGNOSIS — E782 Mixed hyperlipidemia: Secondary | ICD-10-CM | POA: Diagnosis not present

## 2017-08-13 DIAGNOSIS — J441 Chronic obstructive pulmonary disease with (acute) exacerbation: Secondary | ICD-10-CM | POA: Diagnosis not present

## 2017-08-13 DIAGNOSIS — I10 Essential (primary) hypertension: Secondary | ICD-10-CM | POA: Diagnosis not present

## 2017-08-13 DIAGNOSIS — I471 Supraventricular tachycardia: Secondary | ICD-10-CM | POA: Diagnosis not present

## 2017-08-13 DIAGNOSIS — I480 Paroxysmal atrial fibrillation: Secondary | ICD-10-CM | POA: Diagnosis not present

## 2017-08-13 DIAGNOSIS — I483 Typical atrial flutter: Secondary | ICD-10-CM | POA: Diagnosis not present

## 2017-08-20 DIAGNOSIS — I509 Heart failure, unspecified: Secondary | ICD-10-CM | POA: Diagnosis not present

## 2017-08-20 DIAGNOSIS — S82892A Other fracture of left lower leg, initial encounter for closed fracture: Secondary | ICD-10-CM | POA: Diagnosis not present

## 2017-08-20 DIAGNOSIS — I5023 Acute on chronic systolic (congestive) heart failure: Secondary | ICD-10-CM | POA: Diagnosis not present

## 2017-08-20 DIAGNOSIS — I951 Orthostatic hypotension: Secondary | ICD-10-CM | POA: Diagnosis not present

## 2017-08-22 DIAGNOSIS — Z7951 Long term (current) use of inhaled steroids: Secondary | ICD-10-CM | POA: Diagnosis not present

## 2017-08-22 DIAGNOSIS — I4891 Unspecified atrial fibrillation: Secondary | ICD-10-CM | POA: Diagnosis not present

## 2017-08-22 DIAGNOSIS — Z8673 Personal history of transient ischemic attack (TIA), and cerebral infarction without residual deficits: Secondary | ICD-10-CM | POA: Diagnosis not present

## 2017-08-22 DIAGNOSIS — Z87891 Personal history of nicotine dependence: Secondary | ICD-10-CM | POA: Diagnosis not present

## 2017-08-22 DIAGNOSIS — Z79899 Other long term (current) drug therapy: Secondary | ICD-10-CM | POA: Diagnosis not present

## 2017-08-22 DIAGNOSIS — I6781 Acute cerebrovascular insufficiency: Secondary | ICD-10-CM | POA: Diagnosis not present

## 2017-08-22 DIAGNOSIS — Z885 Allergy status to narcotic agent status: Secondary | ICD-10-CM | POA: Diagnosis not present

## 2017-08-22 DIAGNOSIS — S0990XA Unspecified injury of head, initial encounter: Secondary | ICD-10-CM | POA: Diagnosis not present

## 2017-08-22 DIAGNOSIS — Z888 Allergy status to other drugs, medicaments and biological substances status: Secondary | ICD-10-CM | POA: Diagnosis not present

## 2017-08-22 DIAGNOSIS — M50322 Other cervical disc degeneration at C5-C6 level: Secondary | ICD-10-CM | POA: Diagnosis not present

## 2017-08-22 DIAGNOSIS — G8911 Acute pain due to trauma: Secondary | ICD-10-CM | POA: Diagnosis not present

## 2017-08-22 DIAGNOSIS — S0003XA Contusion of scalp, initial encounter: Secondary | ICD-10-CM | POA: Diagnosis not present

## 2017-08-22 DIAGNOSIS — M50323 Other cervical disc degeneration at C6-C7 level: Secondary | ICD-10-CM | POA: Diagnosis not present

## 2017-08-22 DIAGNOSIS — S0101XA Laceration without foreign body of scalp, initial encounter: Secondary | ICD-10-CM | POA: Diagnosis not present

## 2017-08-22 DIAGNOSIS — M47891 Other spondylosis, occipito-atlanto-axial region: Secondary | ICD-10-CM | POA: Diagnosis not present

## 2017-08-22 DIAGNOSIS — Z043 Encounter for examination and observation following other accident: Secondary | ICD-10-CM | POA: Diagnosis not present

## 2017-08-24 DIAGNOSIS — I10 Essential (primary) hypertension: Secondary | ICD-10-CM | POA: Diagnosis not present

## 2017-08-24 DIAGNOSIS — I483 Typical atrial flutter: Secondary | ICD-10-CM | POA: Diagnosis not present

## 2017-08-24 DIAGNOSIS — I480 Paroxysmal atrial fibrillation: Secondary | ICD-10-CM | POA: Diagnosis not present

## 2017-08-24 DIAGNOSIS — J441 Chronic obstructive pulmonary disease with (acute) exacerbation: Secondary | ICD-10-CM | POA: Diagnosis not present

## 2017-08-24 DIAGNOSIS — E782 Mixed hyperlipidemia: Secondary | ICD-10-CM | POA: Diagnosis not present

## 2017-08-24 DIAGNOSIS — I471 Supraventricular tachycardia: Secondary | ICD-10-CM | POA: Diagnosis not present

## 2017-08-26 DIAGNOSIS — S12500A Unspecified displaced fracture of sixth cervical vertebra, initial encounter for closed fracture: Secondary | ICD-10-CM | POA: Diagnosis not present

## 2017-08-26 DIAGNOSIS — J45909 Unspecified asthma, uncomplicated: Secondary | ICD-10-CM | POA: Diagnosis not present

## 2017-08-26 DIAGNOSIS — S14115A Complete lesion at C5 level of cervical spinal cord, initial encounter: Secondary | ICD-10-CM | POA: Diagnosis not present

## 2017-08-26 DIAGNOSIS — I6782 Cerebral ischemia: Secondary | ICD-10-CM | POA: Diagnosis not present

## 2017-08-26 DIAGNOSIS — K567 Ileus, unspecified: Secondary | ICD-10-CM | POA: Diagnosis not present

## 2017-08-26 DIAGNOSIS — S3992XA Unspecified injury of lower back, initial encounter: Secondary | ICD-10-CM | POA: Diagnosis not present

## 2017-08-26 DIAGNOSIS — G9520 Unspecified cord compression: Secondary | ICD-10-CM | POA: Diagnosis not present

## 2017-08-26 DIAGNOSIS — S129XXA Fracture of neck, unspecified, initial encounter: Secondary | ICD-10-CM | POA: Diagnosis not present

## 2017-08-26 DIAGNOSIS — I878 Other specified disorders of veins: Secondary | ICD-10-CM | POA: Diagnosis not present

## 2017-08-26 DIAGNOSIS — R1310 Dysphagia, unspecified: Secondary | ICD-10-CM | POA: Diagnosis not present

## 2017-08-26 DIAGNOSIS — S0990XA Unspecified injury of head, initial encounter: Secondary | ICD-10-CM | POA: Diagnosis not present

## 2017-08-26 DIAGNOSIS — G9389 Other specified disorders of brain: Secondary | ICD-10-CM | POA: Diagnosis not present

## 2017-08-26 DIAGNOSIS — M50222 Other cervical disc displacement at C5-C6 level: Secondary | ICD-10-CM | POA: Diagnosis not present

## 2017-08-26 DIAGNOSIS — N3090 Cystitis, unspecified without hematuria: Secondary | ICD-10-CM | POA: Diagnosis not present

## 2017-08-26 DIAGNOSIS — D649 Anemia, unspecified: Secondary | ICD-10-CM | POA: Diagnosis not present

## 2017-08-26 DIAGNOSIS — I4891 Unspecified atrial fibrillation: Secondary | ICD-10-CM | POA: Diagnosis not present

## 2017-08-26 DIAGNOSIS — S14106A Unspecified injury at C6 level of cervical spinal cord, initial encounter: Secondary | ICD-10-CM | POA: Diagnosis not present

## 2017-08-26 DIAGNOSIS — J986 Disorders of diaphragm: Secondary | ICD-10-CM | POA: Diagnosis not present

## 2017-08-26 DIAGNOSIS — M546 Pain in thoracic spine: Secondary | ICD-10-CM | POA: Diagnosis not present

## 2017-08-26 DIAGNOSIS — K449 Diaphragmatic hernia without obstruction or gangrene: Secondary | ICD-10-CM | POA: Diagnosis not present

## 2017-08-26 DIAGNOSIS — R739 Hyperglycemia, unspecified: Secondary | ICD-10-CM | POA: Diagnosis not present

## 2017-08-26 DIAGNOSIS — Z9911 Dependence on respirator [ventilator] status: Secondary | ICD-10-CM | POA: Diagnosis not present

## 2017-08-26 DIAGNOSIS — J449 Chronic obstructive pulmonary disease, unspecified: Secondary | ICD-10-CM | POA: Diagnosis not present

## 2017-08-26 DIAGNOSIS — G8250 Quadriplegia, unspecified: Secondary | ICD-10-CM | POA: Diagnosis not present

## 2017-08-26 DIAGNOSIS — D696 Thrombocytopenia, unspecified: Secondary | ICD-10-CM | POA: Diagnosis not present

## 2017-08-26 DIAGNOSIS — M438X4 Other specified deforming dorsopathies, thoracic region: Secondary | ICD-10-CM | POA: Diagnosis not present

## 2017-08-26 DIAGNOSIS — W01198A Fall on same level from slipping, tripping and stumbling with subsequent striking against other object, initial encounter: Secondary | ICD-10-CM | POA: Diagnosis not present

## 2017-08-26 DIAGNOSIS — M545 Low back pain: Secondary | ICD-10-CM | POA: Diagnosis not present

## 2017-08-26 DIAGNOSIS — S299XXA Unspecified injury of thorax, initial encounter: Secondary | ICD-10-CM | POA: Diagnosis not present

## 2017-08-26 DIAGNOSIS — J189 Pneumonia, unspecified organism: Secondary | ICD-10-CM | POA: Diagnosis not present

## 2017-08-26 DIAGNOSIS — K5900 Constipation, unspecified: Secondary | ICD-10-CM | POA: Diagnosis not present

## 2017-08-26 DIAGNOSIS — J90 Pleural effusion, not elsewhere classified: Secondary | ICD-10-CM | POA: Diagnosis not present

## 2017-08-26 DIAGNOSIS — E871 Hypo-osmolality and hyponatremia: Secondary | ICD-10-CM | POA: Diagnosis not present

## 2017-08-26 DIAGNOSIS — J9601 Acute respiratory failure with hypoxia: Secondary | ICD-10-CM | POA: Diagnosis not present

## 2017-08-26 DIAGNOSIS — R0603 Acute respiratory distress: Secondary | ICD-10-CM | POA: Diagnosis not present

## 2017-08-26 DIAGNOSIS — J150 Pneumonia due to Klebsiella pneumoniae: Secondary | ICD-10-CM | POA: Diagnosis not present

## 2017-08-26 DIAGNOSIS — Z4682 Encounter for fitting and adjustment of non-vascular catheter: Secondary | ICD-10-CM | POA: Diagnosis not present

## 2017-08-26 DIAGNOSIS — G40909 Epilepsy, unspecified, not intractable, without status epilepticus: Secondary | ICD-10-CM | POA: Diagnosis not present

## 2017-08-26 DIAGNOSIS — I6789 Other cerebrovascular disease: Secondary | ICD-10-CM | POA: Diagnosis not present

## 2017-08-26 DIAGNOSIS — R51 Headache: Secondary | ICD-10-CM | POA: Diagnosis not present

## 2017-08-26 DIAGNOSIS — J984 Other disorders of lung: Secondary | ICD-10-CM | POA: Diagnosis not present

## 2017-08-26 DIAGNOSIS — S12501A Unspecified nondisplaced fracture of sixth cervical vertebra, initial encounter for closed fracture: Secondary | ICD-10-CM | POA: Diagnosis not present

## 2017-08-26 DIAGNOSIS — R0689 Other abnormalities of breathing: Secondary | ICD-10-CM | POA: Diagnosis not present

## 2017-08-26 DIAGNOSIS — Z452 Encounter for adjustment and management of vascular access device: Secondary | ICD-10-CM | POA: Diagnosis not present

## 2017-08-26 DIAGNOSIS — K560 Paralytic ileus: Secondary | ICD-10-CM | POA: Diagnosis not present

## 2017-08-26 DIAGNOSIS — Z981 Arthrodesis status: Secondary | ICD-10-CM | POA: Diagnosis not present

## 2017-08-26 DIAGNOSIS — J441 Chronic obstructive pulmonary disease with (acute) exacerbation: Secondary | ICD-10-CM | POA: Diagnosis not present

## 2017-08-26 DIAGNOSIS — S12400A Unspecified displaced fracture of fifth cervical vertebra, initial encounter for closed fracture: Secondary | ICD-10-CM | POA: Diagnosis not present

## 2017-08-26 DIAGNOSIS — I081 Rheumatic disorders of both mitral and tricuspid valves: Secondary | ICD-10-CM | POA: Diagnosis not present

## 2017-08-26 DIAGNOSIS — N3001 Acute cystitis with hematuria: Secondary | ICD-10-CM | POA: Diagnosis not present

## 2017-08-26 DIAGNOSIS — N390 Urinary tract infection, site not specified: Secondary | ICD-10-CM | POA: Diagnosis not present

## 2017-08-26 DIAGNOSIS — R918 Other nonspecific abnormal finding of lung field: Secondary | ICD-10-CM | POA: Diagnosis not present

## 2017-08-26 DIAGNOSIS — R627 Adult failure to thrive: Secondary | ICD-10-CM | POA: Diagnosis not present

## 2017-08-26 DIAGNOSIS — J9602 Acute respiratory failure with hypercapnia: Secondary | ICD-10-CM | POA: Diagnosis not present

## 2017-08-26 DIAGNOSIS — S064X0A Epidural hemorrhage without loss of consciousness, initial encounter: Secondary | ICD-10-CM | POA: Diagnosis not present

## 2017-08-26 DIAGNOSIS — R9389 Abnormal findings on diagnostic imaging of other specified body structures: Secondary | ICD-10-CM | POA: Diagnosis not present

## 2017-08-26 DIAGNOSIS — I480 Paroxysmal atrial fibrillation: Secondary | ICD-10-CM | POA: Diagnosis not present

## 2017-08-26 DIAGNOSIS — R339 Retention of urine, unspecified: Secondary | ICD-10-CM | POA: Diagnosis not present

## 2017-08-26 DIAGNOSIS — K922 Gastrointestinal hemorrhage, unspecified: Secondary | ICD-10-CM | POA: Diagnosis not present

## 2017-08-26 DIAGNOSIS — G8254 Quadriplegia, C5-C7 incomplete: Secondary | ICD-10-CM | POA: Diagnosis not present

## 2017-08-26 DIAGNOSIS — K439 Ventral hernia without obstruction or gangrene: Secondary | ICD-10-CM | POA: Diagnosis not present

## 2017-08-26 DIAGNOSIS — S134XXA Sprain of ligaments of cervical spine, initial encounter: Secondary | ICD-10-CM | POA: Diagnosis not present

## 2017-08-26 DIAGNOSIS — J988 Other specified respiratory disorders: Secondary | ICD-10-CM | POA: Diagnosis not present

## 2017-08-26 DIAGNOSIS — I517 Cardiomegaly: Secondary | ICD-10-CM | POA: Diagnosis not present

## 2017-08-26 DIAGNOSIS — R112 Nausea with vomiting, unspecified: Secondary | ICD-10-CM | POA: Diagnosis not present

## 2017-08-26 DIAGNOSIS — W19XXXA Unspecified fall, initial encounter: Secondary | ICD-10-CM | POA: Diagnosis not present

## 2017-08-26 DIAGNOSIS — I10 Essential (primary) hypertension: Secondary | ICD-10-CM | POA: Diagnosis not present

## 2017-08-26 DIAGNOSIS — R4789 Other speech disturbances: Secondary | ICD-10-CM | POA: Diagnosis not present

## 2017-08-26 DIAGNOSIS — I482 Chronic atrial fibrillation: Secondary | ICD-10-CM | POA: Diagnosis not present

## 2017-08-26 DIAGNOSIS — T17908A Unspecified foreign body in respiratory tract, part unspecified causing other injury, initial encounter: Secondary | ICD-10-CM | POA: Diagnosis not present

## 2017-08-26 DIAGNOSIS — Y92002 Bathroom of unspecified non-institutional (private) residence single-family (private) house as the place of occurrence of the external cause: Secondary | ICD-10-CM | POA: Diagnosis not present

## 2017-08-26 DIAGNOSIS — E039 Hypothyroidism, unspecified: Secondary | ICD-10-CM | POA: Diagnosis not present

## 2017-08-26 DIAGNOSIS — K6389 Other specified diseases of intestine: Secondary | ICD-10-CM | POA: Diagnosis not present

## 2017-08-26 DIAGNOSIS — J9811 Atelectasis: Secondary | ICD-10-CM | POA: Diagnosis not present

## 2017-08-26 DIAGNOSIS — M4802 Spinal stenosis, cervical region: Secondary | ICD-10-CM | POA: Diagnosis not present

## 2017-08-26 DIAGNOSIS — R531 Weakness: Secondary | ICD-10-CM | POA: Diagnosis not present

## 2017-08-26 DIAGNOSIS — E872 Acidosis: Secondary | ICD-10-CM | POA: Diagnosis not present

## 2017-08-26 DIAGNOSIS — R140 Abdominal distension (gaseous): Secondary | ICD-10-CM | POA: Diagnosis not present

## 2017-08-26 DIAGNOSIS — R579 Shock, unspecified: Secondary | ICD-10-CM | POA: Diagnosis not present

## 2017-08-26 DIAGNOSIS — J9690 Respiratory failure, unspecified, unspecified whether with hypoxia or hypercapnia: Secondary | ICD-10-CM | POA: Diagnosis not present

## 2017-09-18 ENCOUNTER — Other Ambulatory Visit (HOSPITAL_COMMUNITY): Payer: Self-pay

## 2017-09-18 ENCOUNTER — Inpatient Hospital Stay
Admission: RE | Admit: 2017-09-18 | Discharge: 2018-01-01 | Disposition: A | Discharge status: Home / Self Care | Payer: Medicare HMO | Source: Ambulatory Visit | Attending: Internal Medicine | Admitting: Internal Medicine

## 2017-09-18 DIAGNOSIS — S82892A Other fracture of left lower leg, initial encounter for closed fracture: Secondary | ICD-10-CM | POA: Diagnosis not present

## 2017-09-18 DIAGNOSIS — Z8673 Personal history of transient ischemic attack (TIA), and cerebral infarction without residual deficits: Secondary | ICD-10-CM | POA: Diagnosis not present

## 2017-09-18 DIAGNOSIS — I4891 Unspecified atrial fibrillation: Secondary | ICD-10-CM | POA: Diagnosis not present

## 2017-09-18 DIAGNOSIS — J9690 Respiratory failure, unspecified, unspecified whether with hypoxia or hypercapnia: Secondary | ICD-10-CM

## 2017-09-18 DIAGNOSIS — R109 Unspecified abdominal pain: Secondary | ICD-10-CM | POA: Diagnosis not present

## 2017-09-18 DIAGNOSIS — R1310 Dysphagia, unspecified: Secondary | ICD-10-CM | POA: Diagnosis not present

## 2017-09-18 DIAGNOSIS — I5023 Acute on chronic systolic (congestive) heart failure: Secondary | ICD-10-CM | POA: Diagnosis not present

## 2017-09-18 DIAGNOSIS — G8912 Acute post-thoracotomy pain: Secondary | ICD-10-CM | POA: Diagnosis not present

## 2017-09-18 DIAGNOSIS — G8254 Quadriplegia, C5-C7 incomplete: Secondary | ICD-10-CM | POA: Diagnosis not present

## 2017-09-18 DIAGNOSIS — L03311 Cellulitis of abdominal wall: Secondary | ICD-10-CM | POA: Diagnosis not present

## 2017-09-18 DIAGNOSIS — I69391 Dysphagia following cerebral infarction: Secondary | ICD-10-CM | POA: Diagnosis not present

## 2017-09-18 DIAGNOSIS — L89150 Pressure ulcer of sacral region, unstageable: Secondary | ICD-10-CM | POA: Diagnosis not present

## 2017-09-18 DIAGNOSIS — I480 Paroxysmal atrial fibrillation: Secondary | ICD-10-CM | POA: Diagnosis not present

## 2017-09-18 DIAGNOSIS — J9621 Acute and chronic respiratory failure with hypoxia: Secondary | ICD-10-CM | POA: Diagnosis not present

## 2017-09-18 DIAGNOSIS — Z431 Encounter for attention to gastrostomy: Secondary | ICD-10-CM | POA: Diagnosis not present

## 2017-09-18 DIAGNOSIS — S12100A Unspecified displaced fracture of second cervical vertebra, initial encounter for closed fracture: Secondary | ICD-10-CM | POA: Diagnosis not present

## 2017-09-18 DIAGNOSIS — R0603 Acute respiratory distress: Secondary | ICD-10-CM

## 2017-09-18 DIAGNOSIS — K567 Ileus, unspecified: Secondary | ICD-10-CM | POA: Diagnosis not present

## 2017-09-18 DIAGNOSIS — J90 Pleural effusion, not elsewhere classified: Secondary | ICD-10-CM

## 2017-09-18 DIAGNOSIS — I509 Heart failure, unspecified: Secondary | ICD-10-CM | POA: Diagnosis not present

## 2017-09-18 DIAGNOSIS — Z9911 Dependence on respirator [ventilator] status: Secondary | ICD-10-CM | POA: Diagnosis not present

## 2017-09-18 DIAGNOSIS — S12500A Unspecified displaced fracture of sixth cervical vertebra, initial encounter for closed fracture: Secondary | ICD-10-CM | POA: Diagnosis not present

## 2017-09-18 DIAGNOSIS — G894 Chronic pain syndrome: Secondary | ICD-10-CM | POA: Diagnosis not present

## 2017-09-18 DIAGNOSIS — E441 Mild protein-calorie malnutrition: Secondary | ICD-10-CM | POA: Diagnosis not present

## 2017-09-18 DIAGNOSIS — I482 Chronic atrial fibrillation, unspecified: Secondary | ICD-10-CM | POA: Diagnosis present

## 2017-09-18 DIAGNOSIS — J189 Pneumonia, unspecified organism: Secondary | ICD-10-CM

## 2017-09-18 DIAGNOSIS — E46 Unspecified protein-calorie malnutrition: Secondary | ICD-10-CM | POA: Diagnosis not present

## 2017-09-18 DIAGNOSIS — G8922 Chronic post-thoracotomy pain: Secondary | ICD-10-CM | POA: Diagnosis not present

## 2017-09-18 DIAGNOSIS — R140 Abdominal distension (gaseous): Secondary | ICD-10-CM

## 2017-09-18 DIAGNOSIS — J9809 Other diseases of bronchus, not elsewhere classified: Secondary | ICD-10-CM

## 2017-09-18 DIAGNOSIS — E039 Hypothyroidism, unspecified: Secondary | ICD-10-CM | POA: Diagnosis not present

## 2017-09-18 DIAGNOSIS — J9600 Acute respiratory failure, unspecified whether with hypoxia or hypercapnia: Secondary | ICD-10-CM | POA: Diagnosis not present

## 2017-09-18 DIAGNOSIS — J986 Disorders of diaphragm: Secondary | ICD-10-CM | POA: Diagnosis present

## 2017-09-18 DIAGNOSIS — K9422 Gastrostomy infection: Secondary | ICD-10-CM | POA: Diagnosis not present

## 2017-09-18 DIAGNOSIS — L89159 Pressure ulcer of sacral region, unspecified stage: Secondary | ICD-10-CM | POA: Diagnosis not present

## 2017-09-18 DIAGNOSIS — I639 Cerebral infarction, unspecified: Secondary | ICD-10-CM | POA: Diagnosis not present

## 2017-09-18 DIAGNOSIS — N390 Urinary tract infection, site not specified: Secondary | ICD-10-CM | POA: Diagnosis not present

## 2017-09-18 DIAGNOSIS — S14117D Complete lesion at C7 level of cervical spinal cord, subsequent encounter: Secondary | ICD-10-CM | POA: Diagnosis not present

## 2017-09-18 DIAGNOSIS — R0602 Shortness of breath: Secondary | ICD-10-CM | POA: Diagnosis not present

## 2017-09-18 DIAGNOSIS — R531 Weakness: Secondary | ICD-10-CM | POA: Diagnosis not present

## 2017-09-18 DIAGNOSIS — Z930 Tracheostomy status: Secondary | ICD-10-CM | POA: Diagnosis not present

## 2017-09-18 DIAGNOSIS — E871 Hypo-osmolality and hyponatremia: Secondary | ICD-10-CM | POA: Diagnosis not present

## 2017-09-18 DIAGNOSIS — J45909 Unspecified asthma, uncomplicated: Secondary | ICD-10-CM | POA: Diagnosis not present

## 2017-09-18 DIAGNOSIS — Z4682 Encounter for fitting and adjustment of non-vascular catheter: Secondary | ICD-10-CM | POA: Diagnosis not present

## 2017-09-18 DIAGNOSIS — Z931 Gastrostomy status: Secondary | ICD-10-CM | POA: Diagnosis not present

## 2017-09-18 DIAGNOSIS — J9811 Atelectasis: Secondary | ICD-10-CM | POA: Diagnosis not present

## 2017-09-18 DIAGNOSIS — G8250 Quadriplegia, unspecified: Secondary | ICD-10-CM | POA: Diagnosis not present

## 2017-09-18 DIAGNOSIS — N281 Cyst of kidney, acquired: Secondary | ICD-10-CM | POA: Diagnosis not present

## 2017-09-18 DIAGNOSIS — L02211 Cutaneous abscess of abdominal wall: Secondary | ICD-10-CM | POA: Diagnosis not present

## 2017-09-18 DIAGNOSIS — R197 Diarrhea, unspecified: Secondary | ICD-10-CM | POA: Diagnosis not present

## 2017-09-18 DIAGNOSIS — S12400A Unspecified displaced fracture of fifth cervical vertebra, initial encounter for closed fracture: Secondary | ICD-10-CM | POA: Diagnosis not present

## 2017-09-18 DIAGNOSIS — J984 Other disorders of lung: Secondary | ICD-10-CM | POA: Diagnosis not present

## 2017-09-18 DIAGNOSIS — I951 Orthostatic hypotension: Secondary | ICD-10-CM | POA: Diagnosis not present

## 2017-09-18 DIAGNOSIS — T17500A Unspecified foreign body in bronchus causing asphyxiation, initial encounter: Secondary | ICD-10-CM

## 2017-09-18 DIAGNOSIS — E43 Unspecified severe protein-calorie malnutrition: Secondary | ICD-10-CM | POA: Diagnosis not present

## 2017-09-18 DIAGNOSIS — J449 Chronic obstructive pulmonary disease, unspecified: Secondary | ICD-10-CM | POA: Diagnosis not present

## 2017-09-18 HISTORY — DX: Personal history of transient ischemic attack (TIA), and cerebral infarction without residual deficits: Z86.73

## 2017-09-18 HISTORY — DX: Quadriplegia, C5-C7 incomplete: G82.54

## 2017-09-18 HISTORY — DX: Acute and chronic respiratory failure with hypoxia: J96.21

## 2017-09-19 LAB — COMPREHENSIVE METABOLIC PANEL
ALT: 37 U/L (ref 14–54)
ANION GAP: 3 — AB (ref 5–15)
AST: 55 U/L — ABNORMAL HIGH (ref 15–41)
Albumin: 1.7 g/dL — ABNORMAL LOW (ref 3.5–5.0)
Alkaline Phosphatase: 93 U/L (ref 38–126)
BILIRUBIN TOTAL: 0.3 mg/dL (ref 0.3–1.2)
BUN: 22 mg/dL — ABNORMAL HIGH (ref 6–20)
CHLORIDE: 108 mmol/L (ref 101–111)
CO2: 26 mmol/L (ref 22–32)
Calcium: 7.3 mg/dL — ABNORMAL LOW (ref 8.9–10.3)
Creatinine, Ser: 0.34 mg/dL — ABNORMAL LOW (ref 0.44–1.00)
GFR calc non Af Amer: >60 mL/min (ref >60)
Glucose, Bld: 154 mg/dL — ABNORMAL HIGH (ref 65–99)
POTASSIUM: 4.2 mmol/L (ref 3.5–5.1)
Sodium: 137 mmol/L (ref 135–145)
TOTAL PROTEIN: 5 g/dL — AB (ref 6.5–8.1)

## 2017-09-19 LAB — URINALYSIS, ROUTINE W REFLEX MICROSCOPIC
BILIRUBIN URINE: NEGATIVE
GLUCOSE, UA: NEGATIVE mg/dL
KETONES UR: NEGATIVE mg/dL
NITRITE: NEGATIVE
PH: 6 (ref 5.0–8.0)
PROTEIN: NEGATIVE mg/dL
Specific Gravity, Urine: 1.016 (ref 1.005–1.030)

## 2017-09-19 LAB — PROTIME-INR
INR: 1.17
Prothrombin Time: 14.8 seconds (ref 11.4–15.2)

## 2017-09-19 LAB — BLOOD GAS, ARTERIAL
Acid-Base Excess: 2 mmol/L (ref 0.0–2.0)
Bicarbonate: 26 mmol/L (ref 20.0–28.0)
FIO2: 40
LHR: 16 {breaths}/min
O2 Saturation: 99 %
PEEP: 5 cmH2O
Patient temperature: 98.6
VT: 390 mL
pCO2 arterial: 40.9 mmHg (ref 32.0–48.0)
pH, Arterial: 7.42 (ref 7.350–7.450)
pO2, Arterial: 164 mmHg — ABNORMAL HIGH (ref 83.0–108.0)

## 2017-09-19 LAB — CBC WITH DIFFERENTIAL/PLATELET
ABS IMMATURE GRANULOCYTES: 0 10*3/uL (ref 0.0–0.1)
BASOS PCT: 0 %
Basophils Absolute: 0 10*3/uL (ref 0.0–0.1)
EOS PCT: 2 %
Eosinophils Absolute: 0.1 10*3/uL (ref 0.0–0.7)
HCT: 23.8 % — ABNORMAL LOW (ref 36.0–46.0)
HEMOGLOBIN: 7.2 g/dL — AB (ref 12.0–15.0)
Immature Granulocytes: 1 %
LYMPHS PCT: 3 %
Lymphs Abs: 0.2 10*3/uL — ABNORMAL LOW (ref 0.7–4.0)
MCH: 29.6 pg (ref 26.0–34.0)
MCHC: 30.3 g/dL (ref 30.0–36.0)
MCV: 97.9 fL (ref 78.0–100.0)
MONO ABS: 0.4 10*3/uL (ref 0.1–1.0)
MONOS PCT: 6 %
Neutro Abs: 5.3 10*3/uL (ref 1.7–7.7)
Neutrophils Relative %: 88 %
Platelets: 141 10*3/uL — ABNORMAL LOW (ref 150–400)
RBC: 2.43 MIL/uL — ABNORMAL LOW (ref 3.87–5.11)
RDW: 18.1 % — ABNORMAL HIGH (ref 11.5–15.5)
WBC: 6 10*3/uL (ref 4.0–10.5)

## 2017-09-19 LAB — TSH: TSH: 23.261 u[IU]/mL — AB (ref 0.350–4.500)

## 2017-09-19 LAB — PHOSPHORUS: PHOSPHORUS: 2.3 mg/dL — AB (ref 2.5–4.6)

## 2017-09-19 LAB — HEMOGLOBIN A1C
Hgb A1c MFr Bld: 5.4 % (ref 4.8–5.6)
MEAN PLASMA GLUCOSE: 108.28 mg/dL

## 2017-09-19 LAB — MAGNESIUM: MAGNESIUM: 1.7 mg/dL (ref 1.7–2.4)

## 2017-09-19 LAB — TRIGLYCERIDES: Triglycerides: 24 mg/dL (ref <150)

## 2017-09-19 MED ORDER — CHLORHEXIDINE GLUCONATE 0.12 % MT SOLN
15.00 | OROMUCOSAL | Status: DC
Start: 2017-09-18 — End: 2017-09-19

## 2017-09-19 MED ORDER — SODIUM CHLORIDE 0.9 % IV SOLN
10.00 | INTRAVENOUS | Status: DC
Start: ? — End: 2017-09-19

## 2017-09-19 MED ORDER — ONDANSETRON 4 MG PO TBDP
4.00 | ORAL_TABLET | ORAL | Status: DC
Start: ? — End: 2017-09-19

## 2017-09-19 MED ORDER — IPRATROPIUM-ALBUTEROL 0.5-2.5 (3) MG/3ML IN SOLN
3.00 | RESPIRATORY_TRACT | Status: DC
Start: 2017-09-18 — End: 2017-09-19

## 2017-09-19 MED ORDER — GENERIC EXTERNAL MEDICATION
100.00 | Status: DC
Start: 2017-09-20 — End: 2017-09-19

## 2017-09-19 MED ORDER — POTASSIUM CHLORIDE 20 MEQ/15ML (10%) PO SOLN
20.00 | ORAL | Status: DC
Start: ? — End: 2017-09-19

## 2017-09-19 MED ORDER — CLOTRIMAZOLE 1 % EX CREA
TOPICAL_CREAM | CUTANEOUS | Status: DC
Start: 2017-09-18 — End: 2017-09-19

## 2017-09-19 MED ORDER — ALBUTEROL SULFATE (2.5 MG/3ML) 0.083% IN NEBU
2.50 | INHALATION_SOLUTION | RESPIRATORY_TRACT | Status: DC
Start: ? — End: 2017-09-19

## 2017-09-19 MED ORDER — FAT EMULSION PLANT BASED 20 % IV EMUL
500.00 | INTRAVENOUS | Status: DC
Start: 2017-09-19 — End: 2017-09-19

## 2017-09-19 MED ORDER — BUDESONIDE 0.5 MG/2ML IN SUSP
0.50 | RESPIRATORY_TRACT | Status: DC
Start: 2017-09-18 — End: 2017-09-19

## 2017-09-19 MED ORDER — ACETAMINOPHEN 325 MG PO TABS
650.00 | ORAL_TABLET | ORAL | Status: DC
Start: ? — End: 2017-09-19

## 2017-09-19 MED ORDER — HEPARIN SODIUM (PORCINE) 5000 UNIT/ML IJ SOLN
5000.00 | INTRAMUSCULAR | Status: DC
Start: 2017-09-18 — End: 2017-09-19

## 2017-09-19 MED ORDER — METOCLOPRAMIDE HCL 5 MG/ML IJ SOLN
10.00 | INTRAMUSCULAR | Status: DC
Start: 2017-09-18 — End: 2017-09-19

## 2017-09-19 MED ORDER — POTASSIUM CHLORIDE CRYS ER 20 MEQ PO TBCR
20.00 | EXTENDED_RELEASE_TABLET | ORAL | Status: DC
Start: ? — End: 2017-09-19

## 2017-09-19 MED ORDER — PHOSPHA 250 NEUTRAL 155-852-130 MG PO TABS
500.00 | ORAL_TABLET | ORAL | Status: DC
Start: 2017-09-18 — End: 2017-09-19

## 2017-09-19 MED ORDER — GENERIC EXTERNAL MEDICATION
1.00 | Status: DC
Start: 2017-09-19 — End: 2017-09-19

## 2017-09-19 MED ORDER — BISACODYL 10 MG RE SUPP
10.00 | RECTAL | Status: DC
Start: 2017-09-18 — End: 2017-09-19

## 2017-09-19 MED ORDER — GENERIC EXTERNAL MEDICATION
Status: DC
Start: ? — End: 2017-09-19

## 2017-09-19 MED ORDER — INSULIN LISPRO 100 UNIT/ML ~~LOC~~ SOLN
1.00 | SUBCUTANEOUS | Status: DC
Start: ? — End: 2017-09-19

## 2017-09-19 MED ORDER — METOPROLOL TARTRATE 5 MG/5ML IV SOLN
2.50 | INTRAVENOUS | Status: DC
Start: 2017-09-18 — End: 2017-09-19

## 2017-09-19 MED ORDER — ACETAMINOPHEN 650 MG RE SUPP
650.00 | RECTAL | Status: DC
Start: ? — End: 2017-09-19

## 2017-09-19 MED ORDER — HYDRALAZINE HCL 20 MG/ML IJ SOLN
10.00 | INTRAMUSCULAR | Status: DC
Start: ? — End: 2017-09-19

## 2017-09-19 MED ORDER — FAMOTIDINE 20 MG/2ML IV SOLN
20.00 | INTRAVENOUS | Status: DC
Start: 2017-09-18 — End: 2017-09-19

## 2017-09-19 MED ORDER — ARFORMOTEROL TARTRATE 15 MCG/2ML IN NEBU
15.00 | INHALATION_SOLUTION | RESPIRATORY_TRACT | Status: DC
Start: 2017-09-18 — End: 2017-09-19

## 2017-09-19 MED ORDER — DIPHENHYDRAMINE HCL 50 MG/ML IJ SOLN
12.50 | INTRAMUSCULAR | Status: DC
Start: ? — End: 2017-09-19

## 2017-09-20 ENCOUNTER — Encounter: Payer: Self-pay | Admitting: Internal Medicine

## 2017-09-20 DIAGNOSIS — Z8673 Personal history of transient ischemic attack (TIA), and cerebral infarction without residual deficits: Secondary | ICD-10-CM

## 2017-09-20 DIAGNOSIS — I480 Paroxysmal atrial fibrillation: Secondary | ICD-10-CM | POA: Diagnosis not present

## 2017-09-20 DIAGNOSIS — G8254 Quadriplegia, C5-C7 incomplete: Secondary | ICD-10-CM

## 2017-09-20 DIAGNOSIS — J986 Disorders of diaphragm: Secondary | ICD-10-CM | POA: Diagnosis not present

## 2017-09-20 DIAGNOSIS — J9621 Acute and chronic respiratory failure with hypoxia: Secondary | ICD-10-CM

## 2017-09-20 HISTORY — DX: Quadriplegia, C5-C7 incomplete: G82.54

## 2017-09-20 HISTORY — DX: Personal history of transient ischemic attack (TIA), and cerebral infarction without residual deficits: Z86.73

## 2017-09-20 HISTORY — DX: Acute and chronic respiratory failure with hypoxia: J96.21

## 2017-09-20 LAB — CBC
HCT: 28.3 % — ABNORMAL LOW (ref 36.0–46.0)
HEMOGLOBIN: 8.5 g/dL — AB (ref 12.0–15.0)
MCH: 29.4 pg (ref 26.0–34.0)
MCHC: 30 g/dL (ref 30.0–36.0)
MCV: 97.9 fL (ref 78.0–100.0)
Platelets: 118 10*3/uL — ABNORMAL LOW (ref 150–400)
RBC: 2.89 MIL/uL — AB (ref 3.87–5.11)
RDW: 17.7 % — ABNORMAL HIGH (ref 11.5–15.5)
WBC: 9.2 10*3/uL (ref 4.0–10.5)

## 2017-09-20 LAB — C DIFFICILE QUICK SCREEN W PCR REFLEX
C DIFFICILE (CDIFF) TOXIN: NEGATIVE
C DIFFICLE (CDIFF) ANTIGEN: NEGATIVE
C Diff interpretation: NOT DETECTED

## 2017-09-20 LAB — COMPREHENSIVE METABOLIC PANEL
ALBUMIN: 1.9 g/dL — AB (ref 3.5–5.0)
ALK PHOS: 94 U/L (ref 38–126)
ALT: 33 U/L (ref 14–54)
AST: 49 U/L — AB (ref 15–41)
Anion gap: 8 (ref 5–15)
BILIRUBIN TOTAL: 0.3 mg/dL (ref 0.3–1.2)
BUN: 22 mg/dL — AB (ref 6–20)
CO2: 23 mmol/L (ref 22–32)
Calcium: 7.6 mg/dL — ABNORMAL LOW (ref 8.9–10.3)
Chloride: 105 mmol/L (ref 101–111)
Creatinine, Ser: 0.3 mg/dL — ABNORMAL LOW (ref 0.44–1.00)
GFR calc Af Amer: >60 mL/min (ref >60)
GFR calc non Af Amer: >60 mL/min (ref >60)
GLUCOSE: 145 mg/dL — AB (ref 65–99)
POTASSIUM: 4.3 mmol/L (ref 3.5–5.1)
Sodium: 136 mmol/L (ref 135–145)
TOTAL PROTEIN: 5.4 g/dL — AB (ref 6.5–8.1)

## 2017-09-20 LAB — TRIGLYCERIDES: Triglycerides: 25 mg/dL (ref <150)

## 2017-09-20 LAB — PHOSPHORUS: Phosphorus: 1.7 mg/dL — ABNORMAL LOW (ref 2.5–4.6)

## 2017-09-20 LAB — T4, FREE
Free T4: 0.95 ng/dL (ref 0.82–1.77)
Free T4: 0.91 ng/dL (ref 0.82–1.77)

## 2017-09-20 LAB — MAGNESIUM: Magnesium: 1.7 mg/dL (ref 1.7–2.4)

## 2017-09-20 NOTE — Consult Note (Addendum)
Pulmonary Critical Care Medicine [facility] [place]   PULMONARY SERVICE  CONSULT NOTE  Date of Service: 09/20/2017  Lindsay Mills  OAC:[id number]  DOB: 1945/12/23   DOA: 09/18/2017  Referring Physician: Carron Curie, MD  HPI: Lindsay Mills is a 72 y.o. female seen for follow up of Acute on Chronic Respiratory Failure.  Patient has a very complicated history with a history of spinal cord injury incomplete quadriplegia chronic atrial fibrillation.  Patient apparently was at home fell and was trying to get up and her knee gave out.  Patient hit the back of her head neck and upper back.  Came in with initially no sign of any head trauma.  Patient subsequently had decreased sensation in both legs.  She was not able to feel things as well.  Came into the emergency room and was noted to have fracture C4-7 underwent an emergent fusion surgery.  Patient was on the ventilator not able to come off the ventilator and had to have a tracheostomy done.  This was done on Sep 06, 2017.  Postoperatively there is been some improvement in her left leg only but no other improvement neurologically.  Past medical history Stroke Seizure Scoliosis Hypothyroidism Headache Depression anxiety Asthma  Past surgical history Upper endoscopy Tubal ligation Foot surgery Hysteroscopy D&C Colonoscopy Back surgery  Social history Married former smoker no active drug abuse or alcohol abuse  Family history Noncontributory to the present illness   Medications: Reviewed on Rounds  Physical Exam:  Vitals: Temperature 97.1 pulse 82 respiratory rate 26 blood pressure 134/61 saturations 99%  Ventilator Settings currently is on the ventilator on pressure support mode FiO2 28% tidal volume 400 pressure support 12/5  . General: Comfortable at this time . Eyes: Grossly normal lids, irises & conjunctiva . ENT: grossly tongue is normal . Neck: no obvious mass . Cardiovascular: S1 S2 normal no  gallop . Respiratory: Coarse breath sounds no rhonchi are noted . Abdomen: soft . Skin: no rash seen on limited exam . Musculoskeletal: not rigid . Psychiatric:unable to assess . Neurologic: no seizure no involuntary movements         Lab Data:   Basic Metabolic Panel: Recent Labs  Lab 09/19/17 0617 09/20/17 0621  NA 137 136  K 4.2 4.3  CL 108 105  CO2 26 23  GLUCOSE 154* 145*  BUN 22* 22*  CREATININE 0.34* 0.30*  CALCIUM 7.3* 7.6*  MG 1.7 1.7  PHOS 2.3* 1.7*    Liver Function Tests: Recent Labs  Lab 09/19/17 0617 09/20/17 0621  AST 55* 49*  ALT 37 33  ALKPHOS 93 94  BILITOT 0.3 0.3  PROT 5.0* 5.4*  ALBUMIN 1.7* 1.9*   No results for input(s): LIPASE, AMYLASE in the last 168 hours. No results for input(s): AMMONIA in the last 168 hours.  CBC: Recent Labs  Lab 09/19/17 0617 09/20/17 0621  WBC 6.0 9.2  NEUTROABS 5.3  --   HGB 7.2* 8.5*  HCT 23.8* 28.3*  MCV 97.9 97.9  PLT 141* 118*    Cardiac Enzymes: No results for input(s): CKTOTAL, CKMB, CKMBINDEX, TROPONINI in the last 168 hours.  BNP (last 3 results) No results for input(s): BNP in the last 8760 hours.  ProBNP (last 3 results) No results for input(s): PROBNP in the last 8760 hours.  Radiological Exams: Dg Chest Port 1 View  Result Date: 09/18/2017 CLINICAL DATA:  Ileus.  Respiratory failure. EXAM: PORTABLE CHEST 1 VIEW COMPARISON:  Radiographs 01/23/2005. FINDINGS: 1607 hours.  Enteric tube projects below the diaphragm. There is a right arm PICC, the tip of which is not visualized due to overlap with Harrington rods. Tracheostomy appears well positioned. The heart appears mildly enlarged. In correlation with prior study, there is a moderate size hiatal hernia. There is mild vascular congestion with possible small bilateral pleural effusions. No evidence of pneumothorax. Since the previous examination, the patient has undergone Harrington rod fixation of the thoracolumbar spine for a scoliosis.  Lower cervical fusion has also been performed. No acute osseous findings are seen. IMPRESSION: Probable bilateral pleural effusions and bibasilar atelectasis. Visualized support system appears adequately positioned, although the tip the PICC line is obscured by the orthopedic hardware. Electronically Signed   By: Carey Bullocks M.D.   On: 09/18/2017 16:27   Dg Abd Portable 1v  Result Date: 09/18/2017 CLINICAL DATA:  Ileus, respiratory failure. EXAM: PORTABLE ABDOMEN - 1 VIEW COMPARISON:  Thoracic MRI 10/19/2004.  Chest radiographs 01/23/2005. FINDINGS: Status post Harrington rod fixation of the thoracolumbar spine. There are bilateral sacral and iliac screws. The hardware appears intact. There is a thoracolumbar scoliosis, convex to the left in the lumbar spine. No acute osseous findings are seen. The visualized bowel gas pattern is normal without supine evidence of free intraperitoneal air. Small pelvic calcifications are likely phleboliths. Enteric tube extends below the diaphragm. The left hemidiaphragm appears elevated. In addition, based on prior chest radiographs, there is a probable moderate-size hiatal hernia. IMPRESSION: The bowel gas pattern appears within normal limits. Enteric tube projects below the left hemidiaphragm which appears elevated. Probable hiatal hernia. Electronically Signed   By: Carey Bullocks M.D.   On: 09/18/2017 16:32    Assessment/Plan Principal Problem:   Acute on chronic respiratory failure with hypoxia (HCC) Active Problems:   Atrial fibrillation (HCC)   Incomplete quadriplegia at C5-6 level (HCC)   History of stroke   1. Acute on chronic respiratory failure with hypoxia she is on the ventilator we are weaning her according to protocol today the goal was to do about 4 hours on pressure support mode.  If patient is able to tolerate we should be able to rapidly advance to T collar trials.  Continue with aggressive pulmonary toilet secretion management 2. Chronic  atrial fibrillation currently his rate is controlled we will continue with supportive care. 3. Incomplete quadriplegia C5-6 continue present management prognosis guarded she needs to work with physical therapy rehab. 4. History of stroke continue with supportive care 5. Status post neck fusion continue present management   I have personally seen and evaluated the patient, evaluated laboratory and imaging results, formulated the assessment and plan and placed orders. The Patient requires high complexity decision making for assessment and support.  Case was discussed on Rounds with the Respiratory Therapy Staff total time spent 70 minutes review of the medical record discussion with staff on rounds as well as the primary care team  Yevonne Pax, MD [facility] Pulmonary Critical Care Medicine Sleep Medicine

## 2017-09-21 DIAGNOSIS — Z8673 Personal history of transient ischemic attack (TIA), and cerebral infarction without residual deficits: Secondary | ICD-10-CM

## 2017-09-21 DIAGNOSIS — I480 Paroxysmal atrial fibrillation: Secondary | ICD-10-CM

## 2017-09-21 DIAGNOSIS — J9621 Acute and chronic respiratory failure with hypoxia: Secondary | ICD-10-CM

## 2017-09-21 DIAGNOSIS — J986 Disorders of diaphragm: Secondary | ICD-10-CM

## 2017-09-21 DIAGNOSIS — G8254 Quadriplegia, C5-C7 incomplete: Secondary | ICD-10-CM

## 2017-09-21 LAB — CBC
HCT: 25.5 % — ABNORMAL LOW (ref 36.0–46.0)
HEMOGLOBIN: 7.6 g/dL — AB (ref 12.0–15.0)
MCH: 28.8 pg (ref 26.0–34.0)
MCHC: 29.8 g/dL — ABNORMAL LOW (ref 30.0–36.0)
MCV: 96.6 fL (ref 78.0–100.0)
Platelets: 169 10*3/uL (ref 150–400)
RBC: 2.64 MIL/uL — ABNORMAL LOW (ref 3.87–5.11)
RDW: 17.7 % — AB (ref 11.5–15.5)
WBC: 9 10*3/uL (ref 4.0–10.5)

## 2017-09-21 LAB — BASIC METABOLIC PANEL
ANION GAP: 3 — AB (ref 5–15)
BUN: 23 mg/dL — ABNORMAL HIGH (ref 6–20)
CHLORIDE: 103 mmol/L (ref 101–111)
CO2: 27 mmol/L (ref 22–32)
Calcium: 7.8 mg/dL — ABNORMAL LOW (ref 8.9–10.3)
Creatinine, Ser: 0.3 mg/dL — ABNORMAL LOW (ref 0.44–1.00)
Glucose, Bld: 168 mg/dL — ABNORMAL HIGH (ref 65–99)
POTASSIUM: 4.7 mmol/L (ref 3.5–5.1)
SODIUM: 133 mmol/L — AB (ref 135–145)

## 2017-09-21 LAB — URINE CULTURE

## 2017-09-21 LAB — TRIGLYCERIDES: TRIGLYCERIDES: 13 mg/dL (ref <150)

## 2017-09-21 LAB — MAGNESIUM: Magnesium: 1.9 mg/dL (ref 1.7–2.4)

## 2017-09-21 LAB — PHOSPHORUS: Phosphorus: 1.9 mg/dL — ABNORMAL LOW (ref 2.5–4.6)

## 2017-09-21 NOTE — Progress Notes (Signed)
Pulmonary Critical Care Medicine [facility] [place]   PULMONARY SERVICE  PROGRESS NOTE  Date of Service: 09/21/2017  Lindsay Mills  JOA:[id number]  DOB: 02-19-1946   DOA: 09/18/2017  Referring Physician: Carron Curie, MD  HPI: Lindsay Mills is a 72 y.o. female seen for follow up of Acute on Chronic Respiratory Failure.  She remains on the ventilator.  Spoke with her husband apparently patient also has a history of diaphragmatic paralysis prior to her current injury.  Patient is on the ventilator and on assist control mode  Medications: Reviewed on Rounds  Physical Exam:  Vitals: Temperature 96.6 pulse 72 respiratory rate 17 blood pressure 156/86 saturations 100%  Ventilator Settings mode of ventilation assist control FiO2 20% tidal volume 300 PEEP 5  . General: Comfortable at this time . Eyes: Grossly normal lids, irises & conjunctiva . ENT: grossly tongue is normal . Neck: no obvious mass . Cardiovascular: S1 S2 normal no gallop . Respiratory: No rhonchi expansion is equal . Abdomen: soft . Skin: no rash seen on limited exam . Musculoskeletal: not rigid . Psychiatric:unable to assess . Neurologic: no seizure no involuntary movements         Lab Data:   Basic Metabolic Panel: Recent Labs  Lab 09/19/17 0617 09/20/17 0621 09/21/17 0626  NA 137 136 133*  K 4.2 4.3 4.7  CL 108 105 103  CO2 26 23 27   GLUCOSE 154* 145* 168*  BUN 22* 22* 23*  CREATININE 0.34* 0.30* 0.30*  CALCIUM 7.3* 7.6* 7.8*  MG 1.7 1.7 1.9  PHOS 2.3* 1.7* 1.9*    Liver Function Tests: Recent Labs  Lab 09/19/17 0617 09/20/17 0621  AST 55* 49*  ALT 37 33  ALKPHOS 93 94  BILITOT 0.3 0.3  PROT 5.0* 5.4*  ALBUMIN 1.7* 1.9*   No results for input(s): LIPASE, AMYLASE in the last 168 hours. No results for input(s): AMMONIA in the last 168 hours.  CBC: Recent Labs  Lab 09/19/17 0617 09/20/17 0621 09/21/17 0749  WBC 6.0 9.2 9.0  NEUTROABS 5.3  --   --   HGB 7.2*  8.5* 7.6*  HCT 23.8* 28.3* 25.5*  MCV 97.9 97.9 96.6  PLT 141* 118* 169    Cardiac Enzymes: No results for input(s): CKTOTAL, CKMB, CKMBINDEX, TROPONINI in the last 168 hours.  BNP (last 3 results) No results for input(s): BNP in the last 8760 hours.  ProBNP (last 3 results) No results for input(s): PROBNP in the last 8760 hours.  Radiological Exams: No results found.  Assessment/Plan Principal Problem:   Acute on chronic respiratory failure with hypoxia (HCC) Active Problems:   Disorders of diaphragm   Atrial fibrillation (HCC)   Incomplete quadriplegia at C5-6 level (HCC)   History of stroke   1. Acute on chronic respiratory failure with hypoxia we will continue with full vent support at this time assess for pressure support as discussed on rounds with respiratory therapy. 2. Atrial fibrillation rate is controlled at this time we will continue to follow. 3. Incomplete quadriplegia C5-6 patient has difficult issue with weaning need to monitor very closely she may end up needing some form of nocturnal support 4. History of stroke stable at this time we will continue to follow 5. Incomplete diaphragmatic paralysis as per the history provided by the husband so she does have some element of restrictive lung disease   I have personally seen and evaluated the patient, evaluated laboratory and imaging results, formulated the assessment and plan and  placed orders. The Patient requires high complexity decision making for assessment and support.  Case was discussed on Rounds with the Respiratory Therapy Staff  Yevonne Pax, MD [facility] Pulmonary Critical Care Medicine Sleep Medicine

## 2017-09-22 ENCOUNTER — Other Ambulatory Visit (HOSPITAL_COMMUNITY): Payer: Self-pay

## 2017-09-22 DIAGNOSIS — K567 Ileus, unspecified: Secondary | ICD-10-CM | POA: Diagnosis not present

## 2017-09-22 LAB — BASIC METABOLIC PANEL
ANION GAP: 3 — AB (ref 5–15)
BUN: 21 mg/dL — AB (ref 6–20)
CO2: 31 mmol/L (ref 22–32)
Calcium: 8.1 mg/dL — ABNORMAL LOW (ref 8.9–10.3)
Chloride: 102 mmol/L (ref 101–111)
Creatinine, Ser: 0.31 mg/dL — ABNORMAL LOW (ref 0.44–1.00)
GFR calc Af Amer: >60 mL/min (ref >60)
GFR calc non Af Amer: >60 mL/min (ref >60)
GLUCOSE: 149 mg/dL — AB (ref 65–99)
POTASSIUM: 3.7 mmol/L (ref 3.5–5.1)
Sodium: 136 mmol/L (ref 135–145)

## 2017-09-22 LAB — CBC
HEMATOCRIT: 23.9 % — AB (ref 36.0–46.0)
Hemoglobin: 7.2 g/dL — ABNORMAL LOW (ref 12.0–15.0)
MCH: 28.8 pg (ref 26.0–34.0)
MCHC: 30.1 g/dL (ref 30.0–36.0)
MCV: 95.6 fL (ref 78.0–100.0)
Platelets: 159 10*3/uL (ref 150–400)
RBC: 2.5 MIL/uL — AB (ref 3.87–5.11)
RDW: 17.6 % — ABNORMAL HIGH (ref 11.5–15.5)
WBC: 8 10*3/uL (ref 4.0–10.5)

## 2017-09-22 LAB — PHOSPHORUS: Phosphorus: 2.3 mg/dL — ABNORMAL LOW (ref 2.5–4.6)

## 2017-09-22 LAB — MAGNESIUM: Magnesium: 1.7 mg/dL (ref 1.7–2.4)

## 2017-09-22 MED ORDER — GENERIC EXTERNAL MEDICATION
Status: DC
Start: ? — End: 2017-09-22

## 2017-09-22 NOTE — Progress Notes (Signed)
Pulmonary Critical Care Medicine [facility] [place]   PULMONARY SERVICE  PROGRESS NOTE  Date of Service: 09/22/2017  Lindsay Mills  [id number]  DOB: [birth year]   DOA: 09/18/2017  Referring Physician: Carron Curie, MD  HPI: Lindsay Mills is a 72 y.o. female seen for follow up of Acute on Chronic Respiratory Failure.  Patient is resting mode going on pressure control was able to do about 2 hours on pressure support.  Right now is comfortable without distress  Medications: Reviewed on Rounds  Physical Exam:  Vitals: Temperature 97.6 pulse 87 respiratory rate 19 blood pressure 145/81 saturations 100%  Ventilator Settings mode of ventilation pressure assist control FiO2 28% with a PEEP of 5  . General: Comfortable at this time . Eyes: Grossly normal lids, irises & conjunctiva . ENT: grossly tongue is normal . Neck: no obvious mass . Cardiovascular: S1 S2 normal no gallop . Respiratory: No rhonchi expansion is equal . Abdomen: soft . Skin: no rash seen on limited exam . Musculoskeletal: not rigid . Psychiatric:unable to assess . Neurologic: no seizure no involuntary movements         Lab Data:   Basic Metabolic Panel: Recent Labs  Lab 09/19/17 0617 09/20/17 0621 09/21/17 0626 09/22/17 0541  NA 137 136 133* 136  K 4.2 4.3 4.7 3.7  CL 108 105 103 102  CO2 26 23 27 31   GLUCOSE 154* 145* 168* 149*  BUN 22* 22* 23* 21*  CREATININE 0.34* 0.30* 0.30* 0.31*  CALCIUM 7.3* 7.6* 7.8* 8.1*  MG 1.7 1.7 1.9 1.7  PHOS 2.3* 1.7* 1.9* 2.3*    Liver Function Tests: Recent Labs  Lab 09/19/17 0617 09/20/17 0621  AST 55* 49*  ALT 37 33  ALKPHOS 93 94  BILITOT 0.3 0.3  PROT 5.0* 5.4*  ALBUMIN 1.7* 1.9*   No results for input(s): LIPASE, AMYLASE in the last 168 hours. No results for input(s): AMMONIA in the last 168 hours.  CBC: Recent Labs  Lab 09/19/17 0617 09/20/17 0621 09/21/17 0749 09/22/17 0541  WBC 6.0 9.2 9.0 8.0  NEUTROABS 5.3  --    --   --   HGB 7.2* 8.5* 7.6* 7.2*  HCT 23.8* 28.3* 25.5* 23.9*  MCV 97.9 97.9 96.6 95.6  PLT 141* 118* 169 159    Cardiac Enzymes: No results for input(s): CKTOTAL, CKMB, CKMBINDEX, TROPONINI in the last 168 hours.  BNP (last 3 results) No results for input(s): BNP in the last 8760 hours.  ProBNP (last 3 results) No results for input(s): PROBNP in the last 8760 hours.  Radiological Exams: Dg Abd Portable 1v  Result Date: 09/22/2017 CLINICAL DATA:  Ileus. EXAM: PORTABLE ABDOMEN - 1 VIEW COMPARISON:  09/18/2017. FINDINGS: Gas is seen in nondilated small bowel and colon. IMPRESSION: Normal bowel gas pattern. Electronically Signed   By: Leanna Battles M.D.   On: 09/22/2017 12:02    Assessment/Plan Principal Problem:   Acute on chronic respiratory failure with hypoxia [facility]) Active Problems:   Disorders of diaphragm   Atrial fibrillation (HCC)   Incomplete quadriplegia at C5-6 level (HCC)   History of stroke   1. Acute on chronic respiratory failure with hypoxia we will continue with pressure assist control continue pulmonary toilet supportive care currently is on 20% FiO2 as mentioned above we will wean on pressure support again in the morning 2. Chronic atrial fibrillation rate is controlled we will continue to follow 3. Paralysis of hemidiaphragm will need ongoing nocturnal support if she is  able to come off of the ventilator. 4. Incomplete quadriplegia continue with supportive care 5. History of stroke at baseline   I have personally seen and evaluated the patient, evaluated laboratory and imaging results, formulated the assessment and plan and placed orders. The Patient requires high complexity decision making for assessment and support.  Case was discussed on Rounds with the Respiratory Therapy Staff  Yevonne Pax, MD [facility] Pulmonary Critical Care Medicine Sleep Medicine

## 2017-09-23 ENCOUNTER — Other Ambulatory Visit (HOSPITAL_COMMUNITY): Payer: Self-pay

## 2017-09-23 DIAGNOSIS — K567 Ileus, unspecified: Secondary | ICD-10-CM | POA: Diagnosis not present

## 2017-09-23 LAB — COMPREHENSIVE METABOLIC PANEL
ALK PHOS: 117 U/L (ref 38–126)
ALT: 45 U/L (ref 14–54)
ANION GAP: 5 (ref 5–15)
AST: 60 U/L — ABNORMAL HIGH (ref 15–41)
Albumin: 2.1 g/dL — ABNORMAL LOW (ref 3.5–5.0)
BUN: 22 mg/dL — ABNORMAL HIGH (ref 6–20)
CALCIUM: 8.1 mg/dL — AB (ref 8.9–10.3)
CHLORIDE: 98 mmol/L — AB (ref 101–111)
CO2: 33 mmol/L — AB (ref 22–32)
Creatinine, Ser: 0.33 mg/dL — ABNORMAL LOW (ref 0.44–1.00)
Glucose, Bld: 164 mg/dL — ABNORMAL HIGH (ref 65–99)
Potassium: 3.6 mmol/L (ref 3.5–5.1)
SODIUM: 136 mmol/L (ref 135–145)
Total Bilirubin: 0.3 mg/dL (ref 0.3–1.2)
Total Protein: 5.6 g/dL — ABNORMAL LOW (ref 6.5–8.1)

## 2017-09-23 LAB — RENAL FUNCTION PANEL
ALBUMIN: 2.2 g/dL — AB (ref 3.5–5.0)
ANION GAP: 5 (ref 5–15)
BUN: 22 mg/dL — ABNORMAL HIGH (ref 6–20)
CO2: 32 mmol/L (ref 22–32)
Calcium: 8.1 mg/dL — ABNORMAL LOW (ref 8.9–10.3)
Chloride: 99 mmol/L — ABNORMAL LOW (ref 101–111)
Creatinine, Ser: 0.36 mg/dL — ABNORMAL LOW (ref 0.44–1.00)
Glucose, Bld: 162 mg/dL — ABNORMAL HIGH (ref 65–99)
PHOSPHORUS: 2.7 mg/dL (ref 2.5–4.6)
POTASSIUM: 3.7 mmol/L (ref 3.5–5.1)
Sodium: 136 mmol/L (ref 135–145)

## 2017-09-23 LAB — CBC
HCT: 25.5 % — ABNORMAL LOW (ref 36.0–46.0)
HEMOGLOBIN: 7.8 g/dL — AB (ref 12.0–15.0)
MCH: 28.9 pg (ref 26.0–34.0)
MCHC: 30.6 g/dL (ref 30.0–36.0)
MCV: 94.4 fL (ref 78.0–100.0)
PLATELETS: 203 10*3/uL (ref 150–400)
RBC: 2.7 MIL/uL — AB (ref 3.87–5.11)
RDW: 17.3 % — ABNORMAL HIGH (ref 11.5–15.5)
WBC: 7.5 10*3/uL (ref 4.0–10.5)

## 2017-09-23 LAB — MAGNESIUM: Magnesium: 1.7 mg/dL (ref 1.7–2.4)

## 2017-09-23 MED ORDER — GENERIC EXTERNAL MEDICATION
Status: DC
Start: ? — End: 2017-09-23

## 2017-09-24 DIAGNOSIS — E441 Mild protein-calorie malnutrition: Secondary | ICD-10-CM | POA: Diagnosis not present

## 2017-09-24 LAB — COMPREHENSIVE METABOLIC PANEL
ALBUMIN: 2.2 g/dL — AB (ref 3.5–5.0)
ALK PHOS: 108 U/L (ref 38–126)
ALT: 44 U/L (ref 14–54)
ANION GAP: 5 (ref 5–15)
AST: 54 U/L — AB (ref 15–41)
BILIRUBIN TOTAL: 0.3 mg/dL (ref 0.3–1.2)
BUN: 16 mg/dL (ref 6–20)
CALCIUM: 7.9 mg/dL — AB (ref 8.9–10.3)
CO2: 39 mmol/L — ABNORMAL HIGH (ref 22–32)
Chloride: 92 mmol/L — ABNORMAL LOW (ref 101–111)
Creatinine, Ser: 0.36 mg/dL — ABNORMAL LOW (ref 0.44–1.00)
GFR calc Af Amer: >60 mL/min (ref >60)
GFR calc non Af Amer: >60 mL/min (ref >60)
GLUCOSE: 141 mg/dL — AB (ref 65–99)
Potassium: 3.1 mmol/L — ABNORMAL LOW (ref 3.5–5.1)
Sodium: 136 mmol/L (ref 135–145)
TOTAL PROTEIN: 5.7 g/dL — AB (ref 6.5–8.1)

## 2017-09-24 LAB — CBC
HEMATOCRIT: 25.5 % — AB (ref 36.0–46.0)
HEMOGLOBIN: 7.9 g/dL — AB (ref 12.0–15.0)
MCH: 28.9 pg (ref 26.0–34.0)
MCHC: 31 g/dL (ref 30.0–36.0)
MCV: 93.4 fL (ref 78.0–100.0)
Platelets: 213 10*3/uL (ref 150–400)
RBC: 2.73 MIL/uL — ABNORMAL LOW (ref 3.87–5.11)
RDW: 17.2 % — ABNORMAL HIGH (ref 11.5–15.5)
WBC: 7.7 10*3/uL (ref 4.0–10.5)

## 2017-09-24 LAB — TSH: TSH: 29.38 u[IU]/mL — ABNORMAL HIGH (ref 0.350–4.500)

## 2017-09-24 LAB — T4, FREE: Free T4: 0.87 ng/dL (ref 0.82–1.77)

## 2017-09-24 LAB — MAGNESIUM
Magnesium: 1.7 mg/dL (ref 1.7–2.4)
Magnesium: 1.9 mg/dL (ref 1.7–2.4)

## 2017-09-24 LAB — PHOSPHORUS: Phosphorus: 2.2 mg/dL — ABNORMAL LOW (ref 2.5–4.6)

## 2017-09-24 LAB — POTASSIUM: Potassium: 3.3 mmol/L — ABNORMAL LOW (ref 3.5–5.1)

## 2017-09-24 NOTE — Progress Notes (Signed)
IR consulted for percutaneous gastrostomy tube placement. Anatomy reviewed by Dr. Barbie Banner who has determined patient is not a candidate for percutaneous approach.  Primary team notified.  Order canceled.   Brynda Greathouse, MS RD PA-C 2:31 PM

## 2017-09-24 NOTE — Progress Notes (Signed)
Pulmonary Critical Care Medicine [facility] [place]   PULMONARY SERVICE  PROGRESS NOTE  Date of Service: 09/24/2017  Lindsay Mills  [id number]  DOB: 10-01-1945   DOA: 09/18/2017  Referring Physician: Carron Curie, MD  HPI: Lindsay Mills is a 72 y.o. female seen for follow up of Acute on Chronic Respiratory Failure.  She remains on full vent support.  The patient had a CT scan of the abdomen done in the lower portion of the lungs show some evidence of aspiration pneumonitis.  She also does have a history of diaphragmatic paresis has not been previously mentioned.  Right now is on full support on assist control  Medications: Reviewed on Rounds  Physical Exam:  Vitals: Temperature 97.8 pulse 78 respiratory rate 18 blood pressure is 149/80 saturations 97%  Ventilator Settings mode of ventilation assist control FiO2 28% tidal volume 364 PEEP 5  . General: Comfortable at this time . Eyes: Grossly normal lids, irises & conjunctiva . ENT: grossly tongue is normal . Neck: no obvious mass . Cardiovascular: S1 S2 normal no gallop . Respiratory: No rhonchi noted at this time good air entry . Abdomen: soft . Skin: no rash seen on limited exam . Musculoskeletal: not rigid . Psychiatric:unable to assess . Neurologic: no seizure no involuntary movements         Lab Data:   Basic Metabolic Panel: Recent Labs  Lab 09/20/17 0621 09/21/17 0626 09/22/17 0541 09/23/17 1019 09/24/17 0525  NA 136 133* 136 136  136 136  K 4.3 4.7 3.7 3.6  3.7 3.1*  CL 105 103 102 98*  99* 92*  CO2 23 27 31  33*  32 39*  GLUCOSE 145* 168* 149* 164*  162* 141*  BUN 22* 23* 21* 22*  22* 16  CREATININE 0.30* 0.30* 0.31* 0.33*  0.36* 0.36*  CALCIUM 7.6* 7.8* 8.1* 8.1*  8.1* 7.9*  MG 1.7 1.9 1.7 1.7 1.7  PHOS 1.7* 1.9* 2.3* 2.7 2.2*    Liver Function Tests: Recent Labs  Lab 09/19/17 0617 09/20/17 0621 09/23/17 1019 09/24/17 0525  AST 55* 49* 60* 54*  ALT 37 33 45 44   ALKPHOS 93 94 117 108  BILITOT 0.3 0.3 0.3 0.3  PROT 5.0* 5.4* 5.6* 5.7*  ALBUMIN 1.7* 1.9* 2.1*  2.2* 2.2*   No results for input(s): LIPASE, AMYLASE in the last 168 hours. No results for input(s): AMMONIA in the last 168 hours.  CBC: Recent Labs  Lab 09/19/17 0617 09/20/17 0621 09/21/17 0749 09/22/17 0541 09/23/17 1019 09/24/17 0525  WBC 6.0 9.2 9.0 8.0 7.5 7.7  NEUTROABS 5.3  --   --   --   --   --   HGB 7.2* 8.5* 7.6* 7.2* 7.8* 7.9*  HCT 23.8* 28.3* 25.5* 23.9* 25.5* 25.5*  MCV 97.9 97.9 96.6 95.6 94.4 93.4  PLT 141* 118* 169 159 203 213    Cardiac Enzymes: No results for input(s): CKTOTAL, CKMB, CKMBINDEX, TROPONINI in the last 168 hours.  BNP (last 3 results) No results for input(s): BNP in the last 8760 hours.  ProBNP (last 3 results) No results for input(s): PROBNP in the last 8760 hours.  Radiological Exams: Ct Abdomen Wo Contrast  Result Date: 09/24/2017 CLINICAL DATA:  Protein calorie malnutrition. Examination is obtained for evaluation for gastrostomy tube placement. EXAM: CT ABDOMEN WITHOUT CONTRAST TECHNIQUE: Multidetector CT imaging of the abdomen was performed following the standard protocol without IV contrast. COMPARISON:  None. FINDINGS: Lower chest: Elevation of the left hemidiaphragm  posteriorly. There is consolidation in the left lung base. Focal consolidation also in the right lung base. There is opacification of lower lung bronchi consistent with aspiration. Changes probably represent aspiration pneumonia.Enteric tube with tip in the upper stomach. Hepatobiliary: There is increased density in the gallbladder which could represent vicarious excretion of contrast material or milk of calcium. No focal liver lesions are identified. Pancreas: The pancreas appears atrophic. No inflammatory stranding or fluid collection. Spleen: Spleen size is normal.  No focal lesions identified. Adrenals/Urinary Tract: No adrenal gland nodules. 4.7 cm low-attenuation lesion  on the right kidney likely representing a cyst. No hydronephrosis in either kidney. Stomach/Bowel: The stomach is decompressed. The left lobe of the liver, the spleen, and the transverse colon extend anterior to the stomach. Based on this anatomic configuration, I believe gastrostomy tube placement would be difficult. The examination will need to be reviewed by interventional radiologist who would be performing the procedure to see whether the procedure is possible or not. Visualized small and large bowel are not distended. Vascular/Lymphatic: Calcification of the aorta without aneurysm. No significant lymphadenopathy demonstrated on noncontrast imaging. Other: Small amount of free fluid in the upper abdomen, likely ascites. There is diffuse edema in the subcutaneous fatty tissues. Musculoskeletal: Postoperative changes with posterior fixation of the lumbar spine. Lumbar scoliosis. Old rib fractures. IMPRESSION: 1. Consolidation in the lung bases, greater on the left. Opacification of lower lung bronchi consistent with aspiration. Changes likely represent aspiration pneumonia. 2. Stomach is decompressed. Liver, spleen, and transverse colon extend anterior to the stomach. Examination will need to be reviewed by interventional radiology to determine whether gastrostomy tube placement would be possible. These results were called by telephone at the time of interpretation on 09/24/2017 at 1:20 am to [facility], the patient's nurse , who verbally acknowledged these results. Electronically Signed   By: Burman Nieves M.D.   On: 09/24/2017 01:36   Dg Abd Portable 1v  Result Date: 09/23/2017 CLINICAL DATA:  Ileus EXAM: PORTABLE ABDOMEN - 1 VIEW COMPARISON:  09/22/2017 FINDINGS: Gas throughout nondistended large and small bowel. NG tube tip remains in the proximal to mid stomach. No organomegaly or visible free air. IMPRESSION: Nonobstructive bowel gas pattern.  No change. Electronically Signed   By: Charlett Nose M.D.   On:  09/23/2017 07:33   Dg Abd Portable 1v  Result Date: 09/22/2017 CLINICAL DATA:  Ileus. EXAM: PORTABLE ABDOMEN - 1 VIEW COMPARISON:  09/18/2017. FINDINGS: Gas is seen in nondilated small bowel and colon. IMPRESSION: Normal bowel gas pattern. Electronically Signed   By: Leanna Battles M.D.   On: 09/22/2017 12:02    Assessment/Plan Principal Problem:   Acute on chronic respiratory failure with hypoxia [facility]) Active Problems:   Disorders of diaphragm   Atrial fibrillation (HCC)   Incomplete quadriplegia at C5-6 level (HCC)   History of stroke   1. Acute on chronic respiratory failure with hypoxia right now remains on full vent support will assess the our SBI continue with supportive care prognosis is guarded. 2. Disorders of diaphragm with hemiparesis stable continue to follow 3. Chronic atrial fibrillation rate is controlled 4. Incomplete quadriplegia C5-6 continue with supportive care 5. History of stroke grossly unchanged we will continue restorative therapy   I have personally seen and evaluated the patient, evaluated laboratory and imaging results, formulated the assessment and plan and placed orders. The Patient requires high complexity decision making for assessment and support.  Case was discussed on Rounds with the Respiratory Therapy Staff  [facility]  Crista Luria, MD [facility] Pulmonary Critical Care Medicine Sleep Medicine

## 2017-09-25 LAB — POTASSIUM: Potassium: 3.5 mmol/L (ref 3.5–5.1)

## 2017-09-25 LAB — MAGNESIUM: Magnesium: 1.8 mg/dL (ref 1.7–2.4)

## 2017-09-26 LAB — COMPREHENSIVE METABOLIC PANEL
ALBUMIN: 2.2 g/dL — AB (ref 3.5–5.0)
ALK PHOS: 121 U/L (ref 38–126)
ALT: 47 U/L (ref 14–54)
ANION GAP: 4 — AB (ref 5–15)
AST: 57 U/L — AB (ref 15–41)
BILIRUBIN TOTAL: 0.1 mg/dL — AB (ref 0.3–1.2)
BUN: 15 mg/dL (ref 6–20)
CO2: 37 mmol/L — AB (ref 22–32)
Calcium: 8 mg/dL — ABNORMAL LOW (ref 8.9–10.3)
Chloride: 93 mmol/L — ABNORMAL LOW (ref 101–111)
Creatinine, Ser: 0.3 mg/dL — ABNORMAL LOW (ref 0.44–1.00)
GFR calc Af Amer: >60 mL/min (ref >60)
GFR calc non Af Amer: >60 mL/min (ref >60)
GLUCOSE: 140 mg/dL — AB (ref 65–99)
POTASSIUM: 3 mmol/L — AB (ref 3.5–5.1)
Sodium: 134 mmol/L — ABNORMAL LOW (ref 135–145)
TOTAL PROTEIN: 6 g/dL — AB (ref 6.5–8.1)

## 2017-09-26 LAB — POTASSIUM: POTASSIUM: 4.1 mmol/L (ref 3.5–5.1)

## 2017-09-26 LAB — C DIFFICILE QUICK SCREEN W PCR REFLEX
C DIFFICILE (CDIFF) INTERP: NOT DETECTED
C Diff antigen: NEGATIVE
C Diff toxin: NEGATIVE

## 2017-09-27 LAB — CULTURE, RESPIRATORY W GRAM STAIN

## 2017-09-27 LAB — POTASSIUM: POTASSIUM: 3.6 mmol/L (ref 3.5–5.1)

## 2017-09-27 LAB — CULTURE, RESPIRATORY

## 2017-09-28 LAB — CBC
HEMATOCRIT: 26.7 % — AB (ref 36.0–46.0)
Hemoglobin: 8.2 g/dL — ABNORMAL LOW (ref 12.0–15.0)
MCH: 28.2 pg (ref 26.0–34.0)
MCHC: 30.7 g/dL (ref 30.0–36.0)
MCV: 91.8 fL (ref 78.0–100.0)
PLATELETS: 230 10*3/uL (ref 150–400)
RBC: 2.91 MIL/uL — ABNORMAL LOW (ref 3.87–5.11)
RDW: 17.5 % — ABNORMAL HIGH (ref 11.5–15.5)
WBC: 8 10*3/uL (ref 4.0–10.5)

## 2017-09-28 LAB — BASIC METABOLIC PANEL
ANION GAP: 9 (ref 5–15)
BUN: 15 mg/dL (ref 6–20)
CO2: 32 mmol/L (ref 22–32)
Calcium: 8.3 mg/dL — ABNORMAL LOW (ref 8.9–10.3)
Chloride: 95 mmol/L — ABNORMAL LOW (ref 101–111)
Creatinine, Ser: 0.33 mg/dL — ABNORMAL LOW (ref 0.44–1.00)
GFR calc Af Amer: >60 mL/min (ref >60)
GLUCOSE: 150 mg/dL — AB (ref 65–99)
Potassium: 4 mmol/L (ref 3.5–5.1)
Sodium: 136 mmol/L (ref 135–145)

## 2017-09-29 LAB — COMPREHENSIVE METABOLIC PANEL
ALBUMIN: 1.9 g/dL — AB (ref 3.5–5.0)
ALK PHOS: 111 U/L (ref 38–126)
ALT: 77 U/L — ABNORMAL HIGH (ref 14–54)
AST: 99 U/L — AB (ref 15–41)
Anion gap: 5 (ref 5–15)
BILIRUBIN TOTAL: 0.2 mg/dL — AB (ref 0.3–1.2)
BUN: 18 mg/dL (ref 6–20)
CO2: 35 mmol/L — ABNORMAL HIGH (ref 22–32)
Calcium: 8.4 mg/dL — ABNORMAL LOW (ref 8.9–10.3)
Chloride: 97 mmol/L — ABNORMAL LOW (ref 101–111)
Creatinine, Ser: 0.31 mg/dL — ABNORMAL LOW (ref 0.44–1.00)
GFR calc Af Amer: >60 mL/min (ref >60)
GFR calc non Af Amer: >60 mL/min (ref >60)
GLUCOSE: 124 mg/dL — AB (ref 65–99)
Potassium: 3.7 mmol/L (ref 3.5–5.1)
Sodium: 137 mmol/L (ref 135–145)
Total Protein: 5.6 g/dL — ABNORMAL LOW (ref 6.5–8.1)

## 2017-09-29 LAB — CBC
HEMATOCRIT: 26.1 % — AB (ref 36.0–46.0)
Hemoglobin: 8 g/dL — ABNORMAL LOW (ref 12.0–15.0)
MCH: 28.3 pg (ref 26.0–34.0)
MCHC: 30.7 g/dL (ref 30.0–36.0)
MCV: 92.2 fL (ref 78.0–100.0)
Platelets: 201 10*3/uL (ref 150–400)
RBC: 2.83 MIL/uL — ABNORMAL LOW (ref 3.87–5.11)
RDW: 17.5 % — AB (ref 11.5–15.5)
WBC: 8.9 10*3/uL (ref 4.0–10.5)

## 2017-10-01 NOTE — Progress Notes (Signed)
Pulmonary Critical Care Medicine [facility] [place]   PULMONARY SERVICE  PROGRESS NOTE  Date of Service: 10/01/2017  Lindsay Mills  [id number]  DOB: 1945/11/16   DOA: 09/18/2017  Referring Physician: Carron Curie, MD  HPI: Lindsay Mills is a 72 y.o. female seen for follow up of Acute on Chronic Respiratory Failure.  Patient remains on full vent support has not been tolerating weaning.  Check the spontaneous breathing index today which was poor patient was placed back on full support currently is on pressure control mode has been on 28% oxygen with full support.  Medications: Reviewed on Rounds  Physical Exam:  Vitals: Temperature 98.7 pulse 97 respiratory rate 18 blood pressure 133/80 saturations 100%  Ventilator Settings mode of ventilation pressure control FiO2 20% tidal volume 300 PEEP 5  . General: Comfortable at this time . Eyes: Grossly normal lids, irises & conjunctiva . ENT: grossly tongue is normal . Neck: no obvious mass . Cardiovascular: S1 S2 normal no gallop . Respiratory: No rhonchi are noted at this time . Abdomen: soft . Skin: no rash seen on limited exam . Musculoskeletal: not rigid . Psychiatric:unable to assess . Neurologic: no seizure no involuntary movements         Lab Data:   Basic Metabolic Panel: Recent Labs  Lab 09/24/17 2113 09/25/17 0515 09/26/17 5784 09/26/17 2143 09/27/17 0629 09/28/17 0857 09/29/17 0757  NA  --   --  134*  --   --  136 137  K 3.3* 3.5 3.0* 4.1 3.6 4.0 3.7  CL  --   --  93*  --   --  95* 97*  CO2  --   --  37*  --   --  32 35*  GLUCOSE  --   --  140*  --   --  150* 124*  BUN  --   --  15  --   --  15 18  CREATININE  --   --  0.30*  --   --  0.33* 0.31*  CALCIUM  --   --  8.0*  --   --  8.3* 8.4*  MG 1.9 1.8  --   --   --   --   --     Liver Function Tests: Recent Labs  Lab 09/26/17 0635 09/29/17 0757  AST 57* 99*  ALT 47 77*  ALKPHOS 121 111  BILITOT 0.1* 0.2*  PROT 6.0* 5.6*   ALBUMIN 2.2* 1.9*   No results for input(s): LIPASE, AMYLASE in the last 168 hours. No results for input(s): AMMONIA in the last 168 hours.  CBC: Recent Labs  Lab 09/28/17 0857 09/29/17 0757  WBC 8.0 8.9  HGB 8.2* 8.0*  HCT 26.7* 26.1*  MCV 91.8 92.2  PLT 230 201    Cardiac Enzymes: No results for input(s): CKTOTAL, CKMB, CKMBINDEX, TROPONINI in the last 168 hours.  BNP (last 3 results) No results for input(s): BNP in the last 8760 hours.  ProBNP (last 3 results) No results for input(s): PROBNP in the last 8760 hours.  Radiological Exams: No results found.  Assessment/Plan Principal Problem:   Acute on chronic respiratory failure with hypoxia (HCC) Active Problems:   Disorders of diaphragm   Atrial fibrillation (HCC)   Incomplete quadriplegia at C5-6 level (HCC)   History of stroke   1. Acute on chronic respiratory failure with hypoxia we will continue with full support on pressure control mode continue secretion management pulmonary toilet patient is tolerating  it well so far.  The patient has not tolerated the spontaneous breathing trial so we will need to continue to reassess. 2. Diaphragmatic paralysis stable at this time will be limiting factor as far as being able to wean. 3. Chronic atrial fibrillation rate is controlled we will continue to follow 4. Incomplete quadriplegia C5-6 continue with supportive care 5. History of stroke as baseline   I have personally seen and evaluated the patient, evaluated laboratory and imaging results, formulated the assessment and plan and placed orders. The Patient requires high complexity decision making for assessment and support.  Case was discussed on Rounds with the Respiratory Therapy Staff  Yevonne Pax, MD [facility] Pulmonary Critical Care Medicine Sleep Medicine

## 2017-10-02 DIAGNOSIS — I4891 Unspecified atrial fibrillation: Secondary | ICD-10-CM | POA: Diagnosis not present

## 2017-10-02 LAB — COMPREHENSIVE METABOLIC PANEL
ALK PHOS: 105 U/L (ref 38–126)
ALT: 78 U/L — AB (ref 0–44)
AST: 96 U/L — AB (ref 15–41)
Albumin: 1.8 g/dL — ABNORMAL LOW (ref 3.5–5.0)
Anion gap: 8 (ref 5–15)
BILIRUBIN TOTAL: 0.5 mg/dL (ref 0.3–1.2)
BUN: 20 mg/dL (ref 8–23)
CALCIUM: 8.6 mg/dL — AB (ref 8.9–10.3)
CO2: 31 mmol/L (ref 22–32)
Chloride: 98 mmol/L (ref 98–111)
Creatinine, Ser: 0.37 mg/dL — ABNORMAL LOW (ref 0.44–1.00)
GFR calc Af Amer: >60 mL/min (ref >60)
GFR calc non Af Amer: >60 mL/min (ref >60)
GLUCOSE: 100 mg/dL — AB (ref 70–99)
Potassium: 3.8 mmol/L (ref 3.5–5.1)
Sodium: 137 mmol/L (ref 135–145)
TOTAL PROTEIN: 5.5 g/dL — AB (ref 6.5–8.1)

## 2017-10-02 NOTE — Progress Notes (Signed)
Pulmonary Critical Care Medicine [facility] [place]   PULMONARY SERVICE  PROGRESS NOTE  Date of Service: 10/02/2017  Lindsay Mills  [id number]  DOB: 1945-05-09   DOA: 09/18/2017  Referring Physician: Carron Curie, MD  HPI: Lindsay Mills is a 72 y.o. female seen for follow up of Acute on Chronic Respiratory Failure.  Patient remains on the ventilator full support has been failing weaning suspect the patient is not going to be able to come off of the ventilator secondary to diaphragmatic issues  Medications: Reviewed on Rounds  Physical Exam:  Vitals: Temperature 97.6 pulse 80 respiratory rate 18 blood pressure 130/84 saturations 98%  Ventilator Settings mode of ventilation pressure assist control FiO2 28% tidal volume 360 PEEP 5  . General: Comfortable at this time . Eyes: Grossly normal lids, irises & conjunctiva . ENT: grossly tongue is normal . Neck: no obvious mass . Cardiovascular: S1 S2 normal no gallop . Respiratory: No rhonchi or rales are noted . Abdomen: soft . Skin: no rash seen on limited exam . Musculoskeletal: not rigid . Psychiatric:unable to assess . Neurologic: no seizure no involuntary movements         Lab Data:   Basic Metabolic Panel: Recent Labs  Lab 09/26/17 0635 09/26/17 2143 09/27/17 0629 09/28/17 0857 09/29/17 0757 10/02/17 0756  NA 134*  --   --  136 137 137  K 3.0* 4.1 3.6 4.0 3.7 3.8  CL 93*  --   --  95* 97* 98  CO2 37*  --   --  32 35* 31  GLUCOSE 140*  --   --  150* 124* 100*  BUN 15  --   --  15 18 20   CREATININE 0.30*  --   --  0.33* 0.31* 0.37*  CALCIUM 8.0*  --   --  8.3* 8.4* 8.6*    Liver Function Tests: Recent Labs  Lab 09/26/17 0635 09/29/17 0757 10/02/17 0756  AST 57* 99* 96*  ALT 47 77* 78*  ALKPHOS 121 111 105  BILITOT 0.1* 0.2* 0.5  PROT 6.0* 5.6* 5.5*  ALBUMIN 2.2* 1.9* 1.8*   No results for input(s): LIPASE, AMYLASE in the last 168 hours. No results for input(s): AMMONIA in the  last 168 hours.  CBC: Recent Labs  Lab 09/28/17 0857 09/29/17 0757  WBC 8.0 8.9  HGB 8.2* 8.0*  HCT 26.7* 26.1*  MCV 91.8 92.2  PLT 230 201    Cardiac Enzymes: No results for input(s): CKTOTAL, CKMB, CKMBINDEX, TROPONINI in the last 168 hours.  BNP (last 3 results) No results for input(s): BNP in the last 8760 hours.  ProBNP (last 3 results) No results for input(s): PROBNP in the last 8760 hours.  Radiological Exams: No results found.  Assessment/Plan Principal Problem:   Acute on chronic respiratory failure with hypoxia (HCC) Active Problems:   Disorders of diaphragm   Atrial fibrillation (HCC)   Incomplete quadriplegia at C5-6 level (HCC)   History of stroke   1. Acute on chronic respiratory failure with hypoxia we will continue with full vent support and pressure assist control with an FiO2 28% PEEP 5 continue to check the spontaneous breathing index which is mentioned has been poor 2. Disorders of the diaphragm paralysis we will continue stable therapy 3. Chronic atrial fibrillation rate is controlled prognosis is guarded 4. Incomplete C5-6 quadriplegia limiting factor in addition to the diaphragmatic paralysis 5. History of stroke prognosis guarded   I have personally seen and evaluated the patient,  evaluated laboratory and imaging results, formulated the assessment and plan and placed orders. The Patient requires high complexity decision making for assessment and support.  Case was discussed on Rounds with the Respiratory Therapy Staff  Yevonne Pax, MD [facility] Pulmonary Critical Care Medicine Sleep Medicine

## 2017-10-03 LAB — C DIFFICILE QUICK SCREEN W PCR REFLEX
C DIFFICILE (CDIFF) INTERP: NOT DETECTED
C DIFFICILE (CDIFF) TOXIN: NEGATIVE
C Diff antigen: NEGATIVE

## 2017-10-03 NOTE — Progress Notes (Signed)
Pulmonary Critical Care Medicine [facility] [place]   PULMONARY SERVICE  PROGRESS NOTE  Date of Service: 10/03/2017  SHALLAN CINDRIC  [id number]  DOB: 1945/06/21   DOA: 09/18/2017  Referring Physician: Carron Curie, MD  HPI: KOBY PIERPOINT is a 72 y.o. female seen for follow up of Acute on Chronic Respiratory Failure.  Patient is on pressure assist control mode has been on full support right now driving pressure is 22 with a PEEP of 5 patient has not been able to tolerate weaning.  Has low volumes whenever the spontaneous breathing index is measured.  I do not think she is going to be weaned  Medications: Reviewed on Rounds  Physical Exam:  Vitals: Temperature 96.5 pulse 85 respiratory 24 blood pressure 126/77 saturations 100%  Ventilator Settings mode of ventilation pressure assist control FiO2 20% tidal volume PEEP 5 driving pressure inspiratory pressure 22  . General: Comfortable at this time . Eyes: Grossly normal lids, irises & conjunctiva . ENT: grossly tongue is normal . Neck: no obvious mass . Cardiovascular: S1 S2 normal no gallop . Respiratory: No rhonchi or rales are noted . Abdomen: soft . Skin: no rash seen on limited exam . Musculoskeletal: not rigid . Psychiatric:unable to assess . Neurologic: no seizure no involuntary movements         Lab Data:   Basic Metabolic Panel: Recent Labs  Lab 09/26/17 2143 09/27/17 0629 09/28/17 0857 09/29/17 0757 10/02/17 0756  NA  --   --  136 137 137  K 4.1 3.6 4.0 3.7 3.8  CL  --   --  95* 97* 98  CO2  --   --  32 35* 31  GLUCOSE  --   --  150* 124* 100*  BUN  --   --  15 18 20   CREATININE  --   --  0.33* 0.31* 0.37*  CALCIUM  --   --  8.3* 8.4* 8.6*    Liver Function Tests: Recent Labs  Lab 09/29/17 0757 10/02/17 0756  AST 99* 96*  ALT 77* 78*  ALKPHOS 111 105  BILITOT 0.2* 0.5  PROT 5.6* 5.5*  ALBUMIN 1.9* 1.8*   No results for input(s): LIPASE, AMYLASE in the last 168 hours. No  results for input(s): AMMONIA in the last 168 hours.  CBC: Recent Labs  Lab 09/28/17 0857 09/29/17 0757  WBC 8.0 8.9  HGB 8.2* 8.0*  HCT 26.7* 26.1*  MCV 91.8 92.2  PLT 230 201    Cardiac Enzymes: No results for input(s): CKTOTAL, CKMB, CKMBINDEX, TROPONINI in the last 168 hours.  BNP (last 3 results) No results for input(s): BNP in the last 8760 hours.  ProBNP (last 3 results) No results for input(s): PROBNP in the last 8760 hours.  Radiological Exams: No results found.  Assessment/Plan Principal Problem:   Acute on chronic respiratory failure with hypoxia (HCC) Active Problems:   Disorders of diaphragm   Atrial fibrillation (HCC)   Incomplete quadriplegia at C5-6 level (HCC)   History of stroke   1. Acute on chronic respiratory failure with hypoxia we will continue with checking the spontaneous breathing index last chest x-ray had shown bilateral effusion with some atelectasis would recommend getting a follow-up chest x-ray to reassess 2. Diaphragmatic paralysis this is the main reason why I think she may not wean we will continue with supportive care 3. Atrial fibrillation rate is controlled we will continue to follow 4. Incomplete quadriplegia another reason why the patient may not wean  because of diaphragmatic weakness combined with quadriplegia issue 5. History of stroke at baseline   I have personally seen and evaluated the patient, evaluated laboratory and imaging results, formulated the assessment and plan and placed orders. The Patient requires high complexity decision making for assessment and support.  Case was discussed on Rounds with the Respiratory Therapy Staff  Yevonne Pax, MD [facility] Pulmonary Critical Care Medicine Sleep Medicine

## 2017-10-05 LAB — COMPREHENSIVE METABOLIC PANEL
ALT: 50 U/L — ABNORMAL HIGH (ref 0–44)
ANION GAP: 9 (ref 5–15)
AST: 55 U/L — AB (ref 15–41)
Albumin: 1.9 g/dL — ABNORMAL LOW (ref 3.5–5.0)
Alkaline Phosphatase: 100 U/L (ref 38–126)
BILIRUBIN TOTAL: 0.3 mg/dL (ref 0.3–1.2)
BUN: 14 mg/dL (ref 8–23)
CO2: 34 mmol/L — ABNORMAL HIGH (ref 22–32)
Calcium: 8.5 mg/dL — ABNORMAL LOW (ref 8.9–10.3)
Chloride: 95 mmol/L — ABNORMAL LOW (ref 98–111)
Creatinine, Ser: 0.37 mg/dL — ABNORMAL LOW (ref 0.44–1.00)
GFR calc Af Amer: >60 mL/min (ref >60)
Glucose, Bld: 125 mg/dL — ABNORMAL HIGH (ref 70–99)
POTASSIUM: 3.5 mmol/L (ref 3.5–5.1)
Sodium: 138 mmol/L (ref 135–145)
Total Protein: 5.9 g/dL — ABNORMAL LOW (ref 6.5–8.1)

## 2017-10-05 NOTE — Progress Notes (Signed)
Pulmonary Critical Care Medicine [facility] [place]   PULMONARY SERVICE  PROGRESS NOTE  Date of Service: 10/05/2017  Lindsay Mills  [id number]  DOB: 10-Mar-1946   DOA: 09/18/2017  Referring Physician: Carron Curie, MD  HPI: Lindsay Mills is a 72 y.o. female seen for follow up of Acute on Chronic Respiratory Failure.  Patient is on full support right now she is not been able to wean at all.  Her volumes are very low.  Her spontaneous breathing index is poor currently is on pressure assist control mode has been on 28% FiO2  Medications: Reviewed on Rounds  Physical Exam:  Vitals: Temperature 98.7 pulse 88 respiratory rate 21 blood pressure is 124/72 saturations 93%  Ventilator Settings mode of ventilation pressure assist control FiO2 20% tidal volume 388cc PEEP 5  . General: Comfortable at this time . Eyes: Grossly normal lids, irises & conjunctiva . ENT: grossly tongue is normal . Neck: no obvious mass . Cardiovascular: S1 S2 normal no gallop . Respiratory: Good air entry no rhonchi are noted . Abdomen: soft . Skin: no rash seen on limited exam . Musculoskeletal: not rigid . Psychiatric:unable to assess . Neurologic: no seizure no involuntary movements         Lab Data:   Basic Metabolic Panel: Recent Labs  Lab 09/29/17 0757 10/02/17 0756 10/05/17 0619  NA 137 137 138  K 3.7 3.8 3.5  CL 97* 98 95*  CO2 35* 31 34*  GLUCOSE 124* 100* 125*  BUN 18 20 14   CREATININE 0.31* 0.37* 0.37*  CALCIUM 8.4* 8.6* 8.5*    Liver Function Tests: Recent Labs  Lab 09/29/17 0757 10/02/17 0756 10/05/17 0619  AST 99* 96* 55*  ALT 77* 78* 50*  ALKPHOS 111 105 100  BILITOT 0.2* 0.5 0.3  PROT 5.6* 5.5* 5.9*  ALBUMIN 1.9* 1.8* 1.9*   No results for input(s): LIPASE, AMYLASE in the Mills 168 hours. No results for input(s): AMMONIA in the Mills 168 hours.  CBC: Recent Labs  Lab 09/29/17 0757  WBC 8.9  HGB 8.0*  HCT 26.1*  MCV 92.2  PLT 201     Cardiac Enzymes: No results for input(s): CKTOTAL, CKMB, CKMBINDEX, TROPONINI in the Mills 168 hours.  BNP (Mills 3 results) No results for input(s): BNP in the Mills 8760 hours.  ProBNP (Mills 3 results) No results for input(s): PROBNP in the Mills 8760 hours.  Radiological Exams: No results found.  Assessment/Plan Principal Problem:   Acute on chronic respiratory failure with hypoxia (HCC) Active Problems:   Disorders of diaphragm   Atrial fibrillation (HCC)   Incomplete quadriplegia at C5-6 level (HCC)   History of stroke   1. Acute on chronic respiratory failure with hypoxia we will continue with full support on pressure assist control mode again the spontaneous breathing index is not been very good so therefore patient has not been able to tolerate the weaning continue to assess day-to-day. 2. Disorders of diaphragm including diaphragmatic paralysis limiting factor as far as being able to wean 3. Chronic atrial fibrillation rate is controlled we will monitor 4. Incomplete quadriplegia C5-6 we will continue with supportive care prognosis guarded 5. History of stroke continue with restorative therapy   I have personally seen and evaluated the patient, evaluated laboratory and imaging results, formulated the assessment and plan and placed orders. The Patient requires high complexity decision making for assessment and support.  Case was discussed on Rounds with the Respiratory Therapy Staff  Yevonne Pax,  MD [facility] Pulmonary Critical Care Medicine Sleep Medicine

## 2017-10-06 NOTE — Progress Notes (Signed)
Pulmonary [place]   PULMONARY SERVICE  PROGRESS NOTE  Date of Service: 10/06/2017  MCKAY BRANDT  [id number]  DOB: 06/11/1945   DOA: 09/18/2017  Referring Physician: Merton Border, MD  HPI: Lindsay Mills is a 72 y.o. female seen for follow up of Acute on Chronic Respiratory Failure.  Patient is comfortable without distress at this time.  Has been pressure assist-control mode has not been tolerating weaning however.  She has had poor volumes low volumes and increased respiratory rate on weaning attempts  Medications: Reviewed on Rounds  Physical Exam:  Vitals:  Temperature 98.9 degrees pulse 91 respiratory 18 blood pressure 159/76 saturations 95 are%  Ventilator Settings  Mode of ventilation pressure assist-control FiO2 28% tidal volume 500 peep 5 driving pressure is 22  . General: Comfortable at this time . Eyes: Grossly normal lids, irises & conjunctiva . ENT: grossly tongue is normal . Neck: no obvious mass . Cardiovascular: S1 S2 normal no gallop . Respiratory:  No rhonchi or rales are noted at this t . Abdomen: soft . Skin: no rash seen on limited exam . Musculoskeletal: not rigid . Psychiatric:unable to assess . Neurologic: no seizure no involuntary movements         Lab Data:   Basic Metabolic Panel: Recent Labs  Lab 10/02/17 0756 10/05/17 0619  NA 137 138  K 3.8 3.5  CL 98 95*  CO2 31 34*  GLUCOSE 100* 125*  BUN 20 14  CREATININE 0.37* 0.37*  CALCIUM 8.6* 8.5*    Liver Function Tests: Recent Labs  Lab 10/02/17 0756 10/05/17 0619  AST 96* 55*  ALT 78* 50*  ALKPHOS 105 100  BILITOT 0.5 0.3  PROT 5.5* 5.9*  ALBUMIN 1.8* 1.9*   No results for input(s): LIPASE, AMYLASE in the last 168 hours. No results for input(s): AMMONIA in the last 168 hours.  CBC: No results for input(s): WBC, NEUTROABS, HGB, HCT, MCV, PLT in the last 168 hours.  Cardiac Enzymes: No results for input(s): CKTOTAL,  CKMB, CKMBINDEX, TROPONINI in the last 168 hours.  BNP (last 3 results) No results for input(s): BNP in the last 8760 hours.  ProBNP (last 3 results) No results for input(s): PROBNP in the last 8760 hours.  Radiological Exams: No results found.  Assessment/Plan Principal Problem:   Acute on chronic respiratory failure with hypoxia (HCC) Active Problems:   Disorders of diaphragm   Atrial fibrillation (HCC)   Incomplete quadriplegia at C5-6 level (HCC)   History of stroke   1.  acute on chronic Respiratory failure with hypoxia has been failing weaning attempts I do not think she is going to be able to come off ventilator.  Continue with full support on pressure assist-control mode at this time.  Continue pulmonary toilet secretion management.  Right now 28% which she is tolerating 2. Diaphragmatic paralysis will continue supportive care this is the reason for not being able to wean 3. Chronic atrial fibrillation rate is controlled will monitor 4. Incomplete quadriplegia complicating the factor for weaning 5. Stroke at baseline nonverbal   I have personally seen and evaluated the patient, evaluated laboratory and imaging results, formulated the assessment and plan and placed orders. The Patient requires high complexity decision making for assessment and support.  Case was discussed on Rounds with the Respiratory Therapy Staff  Allyne Gee, MD [facility] Pulmonary Critical Care Medicine Sleep Medicine

## 2017-10-07 LAB — CBC
HCT: 30.6 % — ABNORMAL LOW (ref 36.0–46.0)
Hemoglobin: 9.2 g/dL — ABNORMAL LOW (ref 12.0–15.0)
MCH: 28 pg (ref 26.0–34.0)
MCHC: 30.1 g/dL (ref 30.0–36.0)
MCV: 93.3 fL (ref 78.0–100.0)
PLATELETS: 231 10*3/uL (ref 150–400)
RBC: 3.28 MIL/uL — AB (ref 3.87–5.11)
RDW: 17.5 % — ABNORMAL HIGH (ref 11.5–15.5)
WBC: 12.4 10*3/uL — ABNORMAL HIGH (ref 4.0–10.5)

## 2017-10-07 NOTE — Progress Notes (Signed)
Pulmonary [place]   PULMONARY SERVICE  PROGRESS NOTE  Date of Service: 10/07/2017  Lindsay Mills  [id number]  DOB: 1945-04-14   DOA: 09/18/2017  Referring Physician: Merton Border, MD  HPI: Lindsay Mills is a 72 y.o. female seen for follow up of Acute on Chronic Respiratory Failure.  Patient is on full support she is not weaning well remains on pressure assist control mode the patient's oxygen is at 50%.  Also had to go up on her inspiratory pressure to improve her tidal volumes.  Medications: Reviewed on Rounds  Physical Exam:  Vitals: Temperature 97.2 pulse 94 respiratory rate 21 blood pressure 93/50 saturations 96%  Ventilator Settings mode of ventilation pressure assist control FiO2 50% tidal volume 381 PEEP 5 inspiratory driving pressure is 25  . General: Comfortable at this time . Eyes: Grossly normal lids, irises & conjunctiva . ENT: grossly tongue is normal . Neck: no obvious mass . Cardiovascular: S1 S2 normal no gallop . Respiratory: No rhonchi expansion is equal . Abdomen: soft . Skin: no rash seen on limited exam . Musculoskeletal: not rigid . Psychiatric:unable to assess . Neurologic: no seizure no involuntary movements         Lab Data:   Basic Metabolic Panel: Recent Labs  Lab 10/02/17 0756 10/05/17 0619  NA 137 138  K 3.8 3.5  CL 98 95*  CO2 31 34*  GLUCOSE 100* 125*  BUN 20 14  CREATININE 0.37* 0.37*  CALCIUM 8.6* 8.5*    Liver Function Tests: Recent Labs  Lab 10/02/17 0756 10/05/17 0619  AST 96* 55*  ALT 78* 50*  ALKPHOS 105 100  BILITOT 0.5 0.3  PROT 5.5* 5.9*  ALBUMIN 1.8* 1.9*   No results for input(s): LIPASE, AMYLASE in the last 168 hours. No results for input(s): AMMONIA in the last 168 hours.  CBC: Recent Labs  Lab 10/07/17 0730  WBC 12.4*  HGB 9.2*  HCT 30.6*  MCV 93.3  PLT 231    Cardiac Enzymes: No results for input(s): CKTOTAL, CKMB, CKMBINDEX,  TROPONINI in the last 168 hours.  BNP (last 3 results) No results for input(s): BNP in the last 8760 hours.  ProBNP (last 3 results) No results for input(s): PROBNP in the last 8760 hours.  Radiological Exams: No results found.  Assessment/Plan Principal Problem:   Acute on chronic respiratory failure with hypoxia (HCC) Active Problems:   Disorders of diaphragm   Atrial fibrillation (HCC)   Incomplete quadriplegia at C5-6 level (HCC)   History of stroke   1. Acute on chronic respiratory failure with hypoxia we will continue with full vent support.  Patient's mechanics are extremely poor and she is not able to tolerate any attempts at weaning at this time.  If indeed she is not weaning will more than likely. 2. Diaphragmatic paralysis we will continue with supportive care 3. Incomplete quadriplegia we will continue with restorative therapy 4. Chronic atrial fibrillation treated 5. History of stroke continue with restorative therapy   I have personally seen and evaluated the patient, evaluated laboratory and imaging results, formulated the assessment and plan and placed orders. The Patient requires high complexity decision making for assessment and support.  Case was discussed on Rounds with the Respiratory Therapy Staff  Allyne Gee, MD [facility] Pulmonary Critical Care Medicine Sleep Medicine

## 2017-10-08 ENCOUNTER — Other Ambulatory Visit (HOSPITAL_COMMUNITY): Payer: Self-pay

## 2017-10-08 DIAGNOSIS — J9690 Respiratory failure, unspecified, unspecified whether with hypoxia or hypercapnia: Secondary | ICD-10-CM | POA: Diagnosis not present

## 2017-10-08 LAB — COMPREHENSIVE METABOLIC PANEL
ALBUMIN: 1.7 g/dL — AB (ref 3.5–5.0)
ALK PHOS: 89 U/L (ref 38–126)
ALT: 33 U/L (ref 0–44)
AST: 39 U/L (ref 15–41)
Anion gap: 10 (ref 5–15)
BILIRUBIN TOTAL: 0.2 mg/dL — AB (ref 0.3–1.2)
BUN: 20 mg/dL (ref 8–23)
CALCIUM: 8.7 mg/dL — AB (ref 8.9–10.3)
CO2: 35 mmol/L — ABNORMAL HIGH (ref 22–32)
Chloride: 95 mmol/L — ABNORMAL LOW (ref 98–111)
Creatinine, Ser: 0.39 mg/dL — ABNORMAL LOW (ref 0.44–1.00)
GFR calc non Af Amer: >60 mL/min (ref >60)
Glucose, Bld: 125 mg/dL — ABNORMAL HIGH (ref 70–99)
POTASSIUM: 3.2 mmol/L — AB (ref 3.5–5.1)
SODIUM: 140 mmol/L (ref 135–145)
TOTAL PROTEIN: 5.4 g/dL — AB (ref 6.5–8.1)

## 2017-10-08 MED ORDER — GENERIC EXTERNAL MEDICATION
Status: DC
Start: ? — End: 2017-10-08

## 2017-10-08 NOTE — Progress Notes (Signed)
Pulmonary Critical Care Medicine [facility] [place]   PULMONARY SERVICE  PROGRESS NOTE  Date of Service: 10/08/2017  Lindsay Mills  [id number]  DOB: March 26, 1946   DOA: 09/18/2017  Referring Physician: Carron Curie, MD  HPI: Lindsay Mills is a 72 y.o. female seen for follow up of Acute on Chronic Respiratory Failure.  Patient right now is comfortable without distress currently is on pressure control mode has been on 50% oxygen.  The spontaneous breathing index has been consistently failing  Medications: Reviewed on Rounds  Physical Exam:  Vitals: Temperature 97.8 pulse 78 respiratory rate 14 blood pressure 118/67 saturations 100%  Ventilator Settings mode of ventilation pressure control FiO2 50% tidal volume 411 PEEP 5  . General: Comfortable at this time . Eyes: Grossly normal lids, irises & conjunctiva . ENT: grossly tongue is normal . Neck: no obvious mass . Cardiovascular: S1 S2 normal no gallop . Respiratory: No rhonchi or rales are noted at this time . Abdomen: soft . Skin: no rash seen on limited exam . Musculoskeletal: not rigid . Psychiatric:unable to assess . Neurologic: no seizure no involuntary movements         Lab Data:   Basic Metabolic Panel: Recent Labs  Lab 10/02/17 0756 10/05/17 0619 10/08/17 0435  NA 137 138 140  K 3.8 3.5 3.2*  CL 98 95* 95*  CO2 31 34* 35*  GLUCOSE 100* 125* 125*  BUN 20 14 20   CREATININE 0.37* 0.37* 0.39*  CALCIUM 8.6* 8.5* 8.7*    Liver Function Tests: Recent Labs  Lab 10/02/17 0756 10/05/17 0619 10/08/17 0435  AST 96* 55* 39  ALT 78* 50* 33  ALKPHOS 105 100 89  BILITOT 0.5 0.3 0.2*  PROT 5.5* 5.9* 5.4*  ALBUMIN 1.8* 1.9* 1.7*   No results for input(s): LIPASE, AMYLASE in the last 168 hours. No results for input(s): AMMONIA in the last 168 hours.  CBC: Recent Labs  Lab 10/07/17 0730  WBC 12.4*  HGB 9.2*  HCT 30.6*  MCV 93.3  PLT 231    Cardiac Enzymes: No results for  input(s): CKTOTAL, CKMB, CKMBINDEX, TROPONINI in the last 168 hours.  BNP (last 3 results) No results for input(s): BNP in the last 8760 hours.  ProBNP (last 3 results) No results for input(s): PROBNP in the last 8760 hours.  Radiological Exams: Dg Chest Port 1 View  Result Date: 10/08/2017 CLINICAL DATA:  Respiratory failure EXAM: PORTABLE CHEST 1 VIEW COMPARISON:  September 18, 2017 chest radiograph and abdomen CT including lung bases September 24, 2017 FINDINGS: There remains elevation of the left hemidiaphragm. The border of the left hemidiaphragm is not well seen, similar to recent prior studies. Nasogastric tube tip and side port appear to reside in the stomach. No pneumothorax is evident. There has been interval clearing of patchy opacity from the bases. There is bibasilar atelectasis. Heart size and pulmonary vascularity within normal limits. There is rod fixation in the thoracic and lumbar regions. There is moderate scoliosis. IMPRESSION: There has been interval clearing of opacity from the bases with persistent bibasilar atelectasis. No evident new opacity on either side. Note that the border of the left hemidiaphragm is not well delineated. This is a stable finding compared to prior studies. Question chronic injury to the left hemidiaphragm given this appearance. Stable cardiac silhouette. Electronically Signed   By: Bretta Bang III M.D.   On: 10/08/2017 07:34    Assessment/Plan Principal Problem:   Acute on chronic respiratory failure with hypoxia (  HCC) Active Problems:   Disorders of diaphragm   Atrial fibrillation (HCC)   Incomplete quadriplegia at C5-6 level (HCC)   History of stroke   1. Acute on chronic respiratory failure with hypoxia we will continue with full support patient has not been tolerating any weaning attempts we will continue to assess the spontaneous breathing index however because of diaphragmatic paralysis again I do not think that she is going to be able to  wean. 2. Diaphragmatic paralysis as noted above failure to wean 3. Chronic atrial fibrillation rate is controlled 4. Incomplete quadriplegia restorative therapy 5. History of stroke prognosis guarded   I have personally seen and evaluated the patient, evaluated laboratory and imaging results, formulated the assessment and plan and placed orders. The Patient requires high complexity decision making for assessment and support.  Case was discussed on Rounds with the Respiratory Therapy Staff  Yevonne Pax, MD [facility] Pulmonary Critical Care Medicine Sleep Medicine

## 2017-10-09 LAB — POTASSIUM: POTASSIUM: 3.5 mmol/L (ref 3.5–5.1)

## 2017-10-09 NOTE — Progress Notes (Signed)
Pulmonary Critical Care Medicine [facility] [place]   PULMONARY SERVICE  PROGRESS NOTE  Date of Service: 10/09/2017  Lindsay Mills  [id number]  DOB: [birth year]   DOA: 09/18/2017  Referring Physician: Carron Curie, MD  HPI: Lindsay Mills is a 72 y.o. female seen for follow up of Acute on Chronic Respiratory Failure.  Remains on full support patient's on pressure assist-control mode she is not weanable.  This is been discussed numerous times during rounds.  She remains on 40% oxygen at this time  Medications: Reviewed on Rounds  Physical Exam:  Vitals:  Temperature 97.7 degrees pulse 82 respiratory 20 blood pressure 119/72 saturations 94%  Ventilator Settings  Mode of ventilation pressure assist-control FiO2 40% tidal volume 409 peep 5  . General: Comfortable at this time . Eyes: Grossly normal lids, irises & conjunctiva . ENT: grossly tongue is normal . Neck: no obvious mass . Cardiovascular: S1 S2 normal no gallop . Respiratory:  No rhonchi no rales are noted at this time. . Abdomen: soft . Skin: no rash seen on limited exam . Musculoskeletal: not rigid . Psychiatric:unable to assess . Neurologic: no seizure no involuntary movements         Lab Data:   Basic Metabolic Panel: Recent Labs  Lab 10/05/17 0619 10/08/17 0435 10/09/17 0539  NA 138 140  --   K 3.5 3.2* 3.5  CL 95* 95*  --   CO2 34* 35*  --   GLUCOSE 125* 125*  --   BUN 14 20  --   CREATININE 0.37* 0.39*  --   CALCIUM 8.5* 8.7*  --     Liver Function Tests: Recent Labs  Lab 10/05/17 0619 10/08/17 0435  AST 55* 39  ALT 50* 33  ALKPHOS 100 89  BILITOT 0.3 0.2*  PROT 5.9* 5.4*  ALBUMIN 1.9* 1.7*   No results for input(s): LIPASE, AMYLASE in the last 168 hours. No results for input(s): AMMONIA in the last 168 hours.  CBC: Recent Labs  Lab 10/07/17 0730  WBC 12.4*  HGB 9.2*  HCT 30.6*  MCV 93.3  PLT 231    Cardiac Enzymes: No results for input(s): CKTOTAL,  CKMB, CKMBINDEX, TROPONINI in the last 168 hours.  BNP (last 3 results) No results for input(s): BNP in the last 8760 hours.  ProBNP (last 3 results) No results for input(s): PROBNP in the last 8760 hours.  Radiological Exams: Dg Chest Port 1 View  Result Date: 10/08/2017 CLINICAL DATA:  Respiratory failure EXAM: PORTABLE CHEST 1 VIEW COMPARISON:  September 18, 2017 chest radiograph and abdomen CT including lung bases September 24, 2017 FINDINGS: There remains elevation of the left hemidiaphragm. The border of the left hemidiaphragm is not well seen, similar to recent prior studies. Nasogastric tube tip and side port appear to reside in the stomach. No pneumothorax is evident. There has been interval clearing of patchy opacity from the bases. There is bibasilar atelectasis. Heart size and pulmonary vascularity within normal limits. There is rod fixation in the thoracic and lumbar regions. There is moderate scoliosis. IMPRESSION: There has been interval clearing of opacity from the bases with persistent bibasilar atelectasis. No evident new opacity on either side. Note that the border of the left hemidiaphragm is not well delineated. This is a stable finding compared to prior studies. Question chronic injury to the left hemidiaphragm given this appearance. Stable cardiac silhouette. Electronically Signed   By: Bretta Bang III M.D.   On: 10/08/2017 07:34  Assessment/Plan Principal Problem:   Acute on chronic respiratory failure with hypoxia (HCC) Active Problems:   Disorders of diaphragm   Atrial fibrillation (HCC)   Incomplete quadriplegia at C5-6 level (HCC)   History of stroke   1.   Acute on chronic Respiratory failure with hypoxia will continue with the full vent support.  Patient is not weanable because of the diaphragmatic issues as has been previously noted. 2. Paralysis of the diaphragm continue with present management prognosis is guarded.   3. Chronic atrial fibrillation rate is  controlled will monitor  4. Incomplete quadriplegia C5-6 patient has had fusion done will continue with supportive care prognosis guarded  5. History of stroke at baseline unchanged   I have personally seen and evaluated the patient, evaluated laboratory and imaging results, formulated the assessment and plan and placed orders. The Patient requires high complexity decision making for assessment and support.  Case was discussed on Rounds with the Respiratory Therapy Staff  Yevonne Pax, MD [facility] Pulmonary Critical Care Medicine Sleep Medicine

## 2017-10-10 ENCOUNTER — Encounter: Payer: Self-pay | Admitting: Radiology

## 2017-10-10 LAB — CBC
HCT: 29.6 % — ABNORMAL LOW (ref 36.0–46.0)
Hemoglobin: 8.9 g/dL — ABNORMAL LOW (ref 12.0–15.0)
MCH: 27.2 pg (ref 26.0–34.0)
MCHC: 30.1 g/dL (ref 30.0–36.0)
MCV: 90.5 fL (ref 78.0–100.0)
Platelets: 250 10*3/uL (ref 150–400)
RBC: 3.27 MIL/uL — AB (ref 3.87–5.11)
RDW: 17.5 % — AB (ref 11.5–15.5)
WBC: 10.8 10*3/uL — AB (ref 4.0–10.5)

## 2017-10-10 LAB — PROTIME-INR
INR: 1.18
Prothrombin Time: 14.9 seconds (ref 11.4–15.2)

## 2017-10-10 NOTE — H&P (Signed)
Chief Complaint: Patient was seen in consultation today for percutaneous gastric tube insertion at the request of Daiva Eves, Priya   Referring Physician(s): Oralia Rud, NP  Supervising Physician: Marybelle Killings  [facility] Status: [facility]   History of Present Illness: Lindsay Mills is a 72 y.o. female with recent C5-6 incomplete quadraplegia s/p fall. Currently with tracheostomy and full ventilator support. IR consulted for image guided percutaneous gastric tube placement d/t malnutrition and dysphagia. Per patient's husband PEG tube was tried at previous facility without success due to bowel obstructing stomach.  Images reviewed with Dr. Vernard Gambles previously who recommended barium PO before procedure - planned for evening of 7/3.   Will check KUB morning of procedure.   Past Medical History:  Diagnosis Date  . Acute on chronic respiratory failure with hypoxia () 09/20/2017  . Asthma   . Decreased hearing of right ear    not dx  . Depression   . Headache    otc med prn  . History of stroke 09/20/2017  . Hypothyroidism   . Incomplete quadriplegia at C5-6 level ([place]) 09/20/2017  . Multiple allergies   . Scoliosis   . Seizures ([place])    well controlled on medication, last seizure at age 58  . Stroke ([place]) 08/2013   mini stroke, no problems but has some numbness on right check and lower right arm, did not affect her strength   . SVD (spontaneous vaginal delivery)    x 1    Past Surgical History:  Procedure Laterality Date  . BACK SURGERY     x 2, rods in back from shoulder blades to hips  . COLONOSCOPY    . DILATION AND CURETTAGE OF UTERUS    . ganglion cyst removed     right ring finer  . HYSTEROSCOPY W/D&C N/A 10/23/2014   Procedure: DILATATION AND CURETTAGE /HYSTEROSCOPY;  Surgeon: Allyn Kenner, DO;  Location: [facility];  Service: Gynecology;  Laterality: N/A;  . left foot surgery  2006  . right foot surgery  2014  . TUBAL LIGATION  1981  . UPPER GI  ENDOSCOPY      Allergies: Codeine and Phenytoin  Medications: Prior to Admission medications   Medication Sig Start Date End Date Taking? Authorizing Provider  albuterol (PROVENTIL HFA;VENTOLIN HFA) 108 (90 BASE) MCG/ACT inhaler Inhale 1 puff into the lungs every 6 (six) hours as needed for wheezing or shortness of breath.    [provider]  apixaban (ELIQUIS) 5 MG TABS tablet Take 1 tablet (5 mg total) by mouth 2 (two) times daily. 10/24/14   Allyn Kenner, DO  atorvastatin (LIPITOR) 40 MG tablet Take 1 tablet by mouth at bedtime.  09/07/14   [provider]  b complex vitamins tablet Take 1 tablet by mouth daily.    [provider]  budesonide-formoterol (SYMBICORT) 80-4.5 MCG/ACT inhaler Inhale 2 puffs into the lungs 2 (two) times daily.    [provider]  Calcium Carbonate-Vitamin D (CALCIUM-D) 600-400 MG-UNIT TABS Take 2 capsules by mouth 2 (two) times daily.    [provider]  flintstones complete (FLINTSTONES) 60 MG chewable tablet Chew 1 tablet by mouth daily.    [provider]  fluticasone (FLONASE) 50 MCG/ACT nasal spray Place 1 spray into both nostrils 2 (two) times daily.    [provider]  folic acid (FOLVITE) 1 MG tablet Take 1 mg by mouth daily. 09/07/14   [provider]  levothyroxine (SYNTHROID, LEVOTHROID) 100 MCG tablet Take 100 mcg  by mouth every other day. Rotates with levothyroxine 125 mcg.    [provider]  montelukast (SINGULAIR) 10 MG tablet Take 10 mg by mouth daily.    [provider]  primidone (MYSOLINE) 250 MG tablet Take 250 mg by mouth 2 (two) times daily.    [provider]  sertraline (ZOLOFT) 100 MG tablet Take 150 mg by mouth daily.    [provider]  VESICARE 5 MG tablet Take 5 mg by mouth at bedtime.  07/17/14   [provider]  vitamin B-12 (CYANOCOBALAMIN) 1000 MCG tablet Take 1,000 mcg by mouth daily.    [provider]    Vitamin D, Ergocalciferol, (DRISDOL) 50000 UNITS CAPS capsule Take 1 capsule by mouth once a week. 07/07/14   [provider]     History reviewed. No pertinent family history.  Social History   Socioeconomic History  . Marital status: Married    Spouse name: Not on file  . Number of children: Not on file  . Years of education: Not on file  . Highest education level: Not on file  Occupational History  . Not on file  Social Needs  . Financial resource strain: Not on file  . Food insecurity:    Worry: Not on file    Inability: Not on file  . Transportation needs:    Medical: Not on file    Non-medical: Not on file  Tobacco Use  . Smoking status: Former Smoker    Packs/day: 0.15    Years: 15.00    Pack years: 2.25    Types: Cigarettes    Last attempt to quit: 09/09/1975    Years since quitting: 42.1  . Smokeless tobacco: Never Used  Substance and Sexual Activity  . Alcohol use: No  . Drug use: No  . Sexual activity: Yes    Birth control/protection: Post-menopausal  Lifestyle  . Physical activity:    Days per week: Not on file    Minutes per session: Not on file  . Stress: Not on file  Relationships  . Social connections:    Talks on phone: Not on file    Gets together: Not on file    Attends religious service: Not on file    Active member of club or organization: Not on file    Attends meetings of clubs or organizations: Not on file    Relationship status: Not on file  Other Topics Concern  . Not on file  Social History Narrative  . Not on file     Review of Systems: A 12 point ROS discussed and pertinent positives are indicated in the HPI above.  All other systems are negative.  Review of Systems  Constitutional: Positive for activity change and appetite change.  Respiratory: Negative for cough.   Cardiovascular: Negative for chest pain.  Gastrointestinal: Negative for diarrhea and vomiting.  Skin: Negative for rash.  Neurological: Positive for  weakness.  Psychiatric/Behavioral: Negative for agitation, behavioral problems and confusion.    Vital Signs: There were no vitals taken for this visit.  Physical Exam  Constitutional: She appears well-developed and well-nourished. No distress.  HENT:  Head: Normocephalic and atraumatic.  Cardiovascular: Normal rate, regular rhythm and normal heart sounds.  Pulmonary/Chest: Breath sounds normal. No respiratory distress.  Ventilator  Abdominal: Soft. There is no tenderness.  Neurological: She is alert.  Skin: Skin is warm and dry.  Psychiatric: She has a normal mood and affect. Her behavior is normal.  Consented husband  at bedside  Vitals reviewed.   Imaging: Ct Abdomen Wo Contrast  Result Date: 09/24/2017 CLINICAL DATA:  Protein calorie malnutrition. Examination is obtained for evaluation for gastrostomy tube placement. EXAM: CT ABDOMEN WITHOUT CONTRAST TECHNIQUE: Multidetector CT imaging of the abdomen was performed following the standard protocol without IV contrast. COMPARISON:  None. FINDINGS: Lower chest: Elevation of the left hemidiaphragm posteriorly. There is consolidation in the left lung base. Focal consolidation also in the right lung base. There is opacification of lower lung bronchi consistent with aspiration. Changes probably represent aspiration pneumonia.Enteric tube with tip in the upper stomach. Hepatobiliary: There is increased density in the gallbladder which could represent vicarious excretion of contrast material or milk of calcium. No focal liver lesions are identified. Pancreas: The pancreas appears atrophic. No inflammatory stranding or fluid collection. Spleen: Spleen size is normal.  No focal lesions identified. Adrenals/Urinary Tract: No adrenal gland nodules. 4.7 cm low-attenuation lesion on the right kidney likely representing a cyst. No hydronephrosis in either kidney. Stomach/Bowel: The stomach is decompressed. The left lobe of the liver, the spleen, and the  transverse colon extend anterior to the stomach. Based on this anatomic configuration, I believe gastrostomy tube placement would be difficult. The examination will need to be reviewed by interventional radiologist who would be performing the procedure to see whether the procedure is possible or not. Visualized small and large bowel are not distended. Vascular/Lymphatic: Calcification of the aorta without aneurysm. No significant lymphadenopathy demonstrated on noncontrast imaging. Other: Small amount of free fluid in the upper abdomen, likely ascites. There is diffuse edema in the subcutaneous fatty tissues. Musculoskeletal: Postoperative changes with posterior fixation of the lumbar spine. Lumbar scoliosis. Old rib fractures. IMPRESSION: 1. Consolidation in the lung bases, greater on the left. Opacification of lower lung bronchi consistent with aspiration. Changes likely represent aspiration pneumonia. 2. Stomach is decompressed. Liver, spleen, and transverse colon extend anterior to the stomach. Examination will need to be reviewed by interventional radiology to determine whether gastrostomy tube placement would be possible. These results were called by telephone at the time of interpretation on 09/24/2017 at 1:20 am to [facility], the patient's nurse , who verbally acknowledged these results. Electronically Signed   By: Lucienne Capers M.D.   On: 09/24/2017 01:36   Dg Chest Port 1 View  Result Date: 10/08/2017 CLINICAL DATA:  Respiratory failure EXAM: PORTABLE CHEST 1 VIEW COMPARISON:  September 18, 2017 chest radiograph and abdomen CT including lung bases September 24, 2017 FINDINGS: There remains elevation of the left hemidiaphragm. The border of the left hemidiaphragm is not well seen, similar to recent prior studies. Nasogastric tube tip and side port appear to reside in the stomach. No pneumothorax is evident. There has been interval clearing of patchy opacity from the bases. There is bibasilar atelectasis. Heart size  and pulmonary vascularity within normal limits. There is rod fixation in the thoracic and lumbar regions. There is moderate scoliosis. IMPRESSION: There has been interval clearing of opacity from the bases with persistent bibasilar atelectasis. No evident new opacity on either side. Note that the border of the left hemidiaphragm is not well delineated. This is a stable finding compared to prior studies. Question chronic injury to the left hemidiaphragm given this appearance. Stable cardiac silhouette. Electronically Signed   By: Lowella Grip III M.D.   On: 10/08/2017 07:34   Dg Chest Port 1 View  Result Date: 09/18/2017 CLINICAL DATA:  Ileus.  Respiratory failure. EXAM: PORTABLE CHEST 1 VIEW COMPARISON:  Radiographs 01/23/2005.  FINDINGS: 1607 hours. Enteric tube projects below the diaphragm. There is a right arm PICC, the tip of which is not visualized due to overlap with Harrington rods. Tracheostomy appears well positioned. The heart appears mildly enlarged. In correlation with prior study, there is a moderate size hiatal hernia. There is mild vascular congestion with possible small bilateral pleural effusions. No evidence of pneumothorax. Since the previous examination, the patient has undergone Harrington rod fixation of the thoracolumbar spine for a scoliosis. Lower cervical fusion has also been performed. No acute osseous findings are seen. IMPRESSION: Probable bilateral pleural effusions and bibasilar atelectasis. Visualized support system appears adequately positioned, although the tip the PICC line is obscured by the orthopedic hardware. Electronically Signed   By: Richardean Sale M.D.   On: 09/18/2017 16:27   Dg Abd Portable 1v  Result Date: 09/23/2017 CLINICAL DATA:  Ileus EXAM: PORTABLE ABDOMEN - 1 VIEW COMPARISON:  09/22/2017 FINDINGS: Gas throughout nondistended large and small bowel. NG tube tip remains in the proximal to mid stomach. No organomegaly or visible free air. IMPRESSION:  Nonobstructive bowel gas pattern.  No change. Electronically Signed   By: Rolm Baptise M.D.   On: 09/23/2017 07:33   Dg Abd Portable 1v  Result Date: 09/22/2017 CLINICAL DATA:  Ileus. EXAM: PORTABLE ABDOMEN - 1 VIEW COMPARISON:  09/18/2017. FINDINGS: Gas is seen in nondilated small bowel and colon. IMPRESSION: Normal bowel gas pattern. Electronically Signed   By: Lorin Picket M.D.   On: 09/22/2017 12:02   Dg Abd Portable 1v  Result Date: 09/18/2017 CLINICAL DATA:  Ileus, respiratory failure. EXAM: PORTABLE ABDOMEN - 1 VIEW COMPARISON:  Thoracic MRI 10/19/2004.  Chest radiographs 01/23/2005. FINDINGS: Status post Harrington rod fixation of the thoracolumbar spine. There are bilateral sacral and iliac screws. The hardware appears intact. There is a thoracolumbar scoliosis, convex to the left in the lumbar spine. No acute osseous findings are seen. The visualized bowel gas pattern is normal without supine evidence of free intraperitoneal air. Small pelvic calcifications are likely phleboliths. Enteric tube extends below the diaphragm. The left hemidiaphragm appears elevated. In addition, based on prior chest radiographs, there is a probable moderate-size hiatal hernia. IMPRESSION: The bowel gas pattern appears within normal limits. Enteric tube projects below the left hemidiaphragm which appears elevated. Probable hiatal hernia. Electronically Signed   By: Richardean Sale M.D.   On: 09/18/2017 16:32    Labs:  CBC: Recent Labs    09/24/17 0525 09/28/17 0857 09/29/17 0757 10/07/17 0730  WBC 7.7 8.0 8.9 12.4*  HGB 7.9* 8.2* 8.0* 9.2*  HCT 25.5* 26.7* 26.1* 30.6*  PLT 213 230 201 231    COAGS: Recent Labs    09/19/17 0617  INR 1.17    BMP: Recent Labs    09/29/17 0757 10/02/17 0756 10/05/17 0619 10/08/17 0435 10/09/17 0539  NA 137 137 138 140  --   K 3.7 3.8 3.5 3.2* 3.5  CL 97* 98 95* 95*  --   CO2 35* 31 34* 35*  --   GLUCOSE 124* 100* 125* 125*  --   BUN 18 20 14 20   --     CALCIUM 8.4* 8.6* 8.5* 8.7*  --   CREATININE 0.31* 0.37* 0.37* 0.39*  --   GFRNONAA >60 >60 >60 >60  --   GFRAA >60 >60 >60 >60  --     LIVER FUNCTION TESTS: Recent Labs    09/29/17 0757 10/02/17 0756 10/05/17 0619 10/08/17 0435  BILITOT 0.2* 0.5 0.3 0.2*  AST 99* 96* 55* 39  ALT 77* 78* 50* 33  ALKPHOS 111 105 100 89  PROT 5.6* 5.5* 5.9* 5.4*  ALBUMIN 1.9* 1.8* 1.9* 1.7*    TUMOR MARKERS: No results for input(s): AFPTM, CEA, CA199, CHROMGRNA in the last 8760 hours.  Assessment and Plan:  1. Dysphagia - protein calore malnutrition; recent quadraplegia s/p fall  Scheduled for probable gastric tube placement in IR 7/5.  Barium per NG tonight - will check KUB 7/5 AM.  Risks and benefits discussed with the patient and husband including, but not limited to the need for a barium enema during the procedure, bleeding, infection, peritonitis, or damage to adjacent structures.  All of the husband'ss questions were answered, patient and husband are agreeable to proceed. Consent signed and in chart.   Thank you for this interesting consult.  I greatly enjoyed meeting CIELA MAHAJAN and look forward to participating in their care.  A copy of this report was sent to the requesting provider on this date.  Electronically Signed: Kalei Mckillop A, PA-C 10/10/2017, 11:00 AM   I spent a total of 40 Minutes in face to face in clinical consultation, greater than 50% of which was counseling/coordinating care for percutaneous gastric tube placement.

## 2017-10-10 NOTE — Progress Notes (Signed)
[facility]   PULMONARY SERVICE  PROGRESS NOTE  Date of Service: 10/10/2017  Lindsay Mills  VHQ:[id number]  DOB: November 28, 1945   DOA: 09/18/2017  Referring Physician: Merton Border, MD  HPI: Lindsay Mills is a 72 y.o. female seen for follow up of Acute on Chronic Respiratory Failure.  Patient remains on the ventilator is on assist control mode currently 35% oxygen.  She is due for trach to be changed and so we will downsize tube #6  Medications: Reviewed on Rounds  Physical Exam:  Vitals: Temperature 96.8 pulse 90 respiratory rate 22 blood pressure 151/91 saturations 99%  Ventilator Settings mode of ventilation assist control FiO2 35% tidal 400 PEEP  . General: Comfortable at this time . Eyes: Grossly normal lids, irises & conjunctiva . ENT: grossly tongue is normal . Neck: no obvious mass . Cardiovascular: S1 S2 normal no gallop . Respiratory: No rhonchi noted at this time. . Abdomen: soft . Skin: no rash seen on limited exam . Musculoskeletal: not rigid . Psychiatric:unable to assess . Neurologic: no seizure no involuntary movements         Lab Data:   Basic Metabolic Panel: Recent Labs  Lab 10/05/17 0619 10/08/17 0435 10/09/17 0539  NA 138 140  --   K 3.5 3.2* 3.5  CL 95* 95*  --   CO2 34* 35*  --   GLUCOSE 125* 125*  --   BUN 14 20  --   CREATININE 0.37* 0.39*  --   CALCIUM 8.5* 8.7*  --     Liver Function Tests: Recent Labs  Lab 10/05/17 0619 10/08/17 0435  AST 55* 39  ALT 50* 33  ALKPHOS 100 89  BILITOT 0.3 0.2*  PROT 5.9* 5.4*  ALBUMIN 1.9* 1.7*   No results for input(s): LIPASE, AMYLASE in the last 168 hours. No results for input(s): AMMONIA in the last 168 hours.  CBC: Recent Labs  Lab 10/07/17 0730 10/10/17 1102  WBC 12.4* 10.8*  HGB 9.2* 8.9*  HCT 30.6* 29.6*  MCV 93.3 90.5  PLT 231 250    Cardiac Enzymes: No results for input(s): CKTOTAL, CKMB, CKMBINDEX, TROPONINI in  the last 168 hours.  BNP (last 3 results) No results for input(s): BNP in the last 8760 hours.  ProBNP (last 3 results) No results for input(s): PROBNP in the last 8760 hours.  Radiological Exams: No results found.  Assessment/Plan Principal Problem:   Acute on chronic respiratory failure with hypoxia (HCC) Active Problems:   Disorders of diaphragm   Atrial fibrillation (HCC)   Incomplete quadriplegia at C5-6 level (HCC)   History of stroke   1. Acute on chronic respiratory failure with hypoxia patient will continue on full support and the ventilator not amenable we will continue present management. 2. Diaphragmatic paralysis prognosis guarded we will continue supportive care 3. Chronic atrial fibrillation rate is controlled 4. Incomplete quadriplegia C5-6 prognosis guarded 5. History of stroke restorative therapy   I have personally seen and evaluated the patient, evaluated laboratory and imaging results, formulated the assessment and plan and placed orders. The Patient requires high complexity decision making for assessment and support.  Case was discussed on Rounds with the Respiratory Therapy Staff  Allyne Gee, MD [facility] Pulmonary Critical Care Medicine Sleep Medicine

## 2017-10-11 LAB — TSH: TSH: 10.071 u[IU]/mL — ABNORMAL HIGH (ref 0.350–4.500)

## 2017-10-11 LAB — BASIC METABOLIC PANEL
ANION GAP: 10 (ref 5–15)
BUN: 19 mg/dL (ref 8–23)
CALCIUM: 8.4 mg/dL — AB (ref 8.9–10.3)
CO2: 34 mmol/L — AB (ref 22–32)
Chloride: 96 mmol/L — ABNORMAL LOW (ref 98–111)
Creatinine, Ser: 0.38 mg/dL — ABNORMAL LOW (ref 0.44–1.00)
Glucose, Bld: 115 mg/dL — ABNORMAL HIGH (ref 70–99)
Potassium: 3.7 mmol/L (ref 3.5–5.1)
SODIUM: 140 mmol/L (ref 135–145)

## 2017-10-11 LAB — CBC
HCT: 25.6 % — ABNORMAL LOW (ref 36.0–46.0)
HEMOGLOBIN: 7.8 g/dL — AB (ref 12.0–15.0)
MCH: 27.5 pg (ref 26.0–34.0)
MCHC: 30.5 g/dL (ref 30.0–36.0)
MCV: 90.1 fL (ref 78.0–100.0)
PLATELETS: UNDETERMINED 10*3/uL (ref 150–400)
RBC: 2.84 MIL/uL — AB (ref 3.87–5.11)
RDW: 17.8 % — ABNORMAL HIGH (ref 11.5–15.5)
WBC: 8.7 10*3/uL (ref 4.0–10.5)

## 2017-10-11 LAB — T4, FREE: Free T4: 1.15 ng/dL (ref 0.82–1.77)

## 2017-10-12 ENCOUNTER — Other Ambulatory Visit (HOSPITAL_COMMUNITY): Payer: Self-pay

## 2017-10-12 DIAGNOSIS — Z4682 Encounter for fitting and adjustment of non-vascular catheter: Secondary | ICD-10-CM | POA: Diagnosis not present

## 2017-10-12 NOTE — Progress Notes (Signed)
Patient received barium overnight and KUB this AM.  Case and imaging reviewed by Dr. Barbie Banner. No percutaneous window for possible placement.  Called and spoke with Edwena Blow, NP.  No procedure planned at this time.  Order to be cancelled.   Brynda Greathouse, MS RD PA-C 3:18 PM

## 2017-10-15 DIAGNOSIS — I480 Paroxysmal atrial fibrillation: Secondary | ICD-10-CM | POA: Diagnosis not present

## 2017-10-15 DIAGNOSIS — G8254 Quadriplegia, C5-C7 incomplete: Secondary | ICD-10-CM | POA: Diagnosis not present

## 2017-10-15 DIAGNOSIS — J986 Disorders of diaphragm: Secondary | ICD-10-CM | POA: Diagnosis not present

## 2017-10-15 LAB — CBC
HCT: 29.4 % — ABNORMAL LOW (ref 36.0–46.0)
HEMOGLOBIN: 8.9 g/dL — AB (ref 12.0–15.0)
MCH: 26.5 pg (ref 26.0–34.0)
MCHC: 30.3 g/dL (ref 30.0–36.0)
MCV: 87.5 fL (ref 78.0–100.0)
PLATELETS: 161 10*3/uL (ref 150–400)
RBC: 3.36 MIL/uL — ABNORMAL LOW (ref 3.87–5.11)
RDW: 17.8 % — ABNORMAL HIGH (ref 11.5–15.5)
WBC: 11.8 10*3/uL — ABNORMAL HIGH (ref 4.0–10.5)

## 2017-10-15 LAB — BASIC METABOLIC PANEL
Anion gap: 7 (ref 5–15)
BUN: 15 mg/dL (ref 8–23)
CHLORIDE: 97 mmol/L — AB (ref 98–111)
CO2: 36 mmol/L — ABNORMAL HIGH (ref 22–32)
Calcium: 8.3 mg/dL — ABNORMAL LOW (ref 8.9–10.3)
Creatinine, Ser: 0.34 mg/dL — ABNORMAL LOW (ref 0.44–1.00)
GFR calc Af Amer: >60 mL/min (ref >60)
Glucose, Bld: 126 mg/dL — ABNORMAL HIGH (ref 70–99)
POTASSIUM: 3.1 mmol/L — AB (ref 3.5–5.1)
SODIUM: 140 mmol/L (ref 135–145)

## 2017-10-15 LAB — MAGNESIUM: MAGNESIUM: 1.8 mg/dL (ref 1.7–2.4)

## 2017-10-15 NOTE — Progress Notes (Signed)
Pulmonary Critical Care Medicine [facility] [place]   PULMONARY SERVICE  PROGRESS NOTE  Date of Service: 10/15/2017  Lindsay Mills  GEX:[id number]  DOB: 02/08/1946   DOA: 09/18/2017  Referring Physician: Carron Curie, MD  HPI: Lindsay Mills is a 72 y.o. female seen for follow up of Acute on Chronic Respiratory Failure.  Patient was seen by GI and IR for possibility of a PEG placement.  The patient has been deemed as not a candidate for PEG placement.  Right now she is on full support on pressure assist control mode.  Patient has been on 40% oxygen with a PEEP of 5.  She is not really a candidate for weaning at this time.  She will be continued on the vent  Medications: Reviewed on Rounds  Physical Exam:  Vitals: Temperature 98.4 pulse 100 respiratory 18 blood pressure 122/66 saturation 99%  Ventilator Settings mode of ventilation pressure assist control FiO2 40% PEEP 5  . General: Comfortable at this time . Eyes: Grossly normal lids, irises & conjunctiva . ENT: grossly tongue is normal . Neck: no obvious mass . Cardiovascular: S1 S2 normal no gallop . Respiratory: Coarse breath sounds are noted bilaterally . Abdomen: soft . Skin: no rash seen on limited exam . Musculoskeletal: not rigid . Psychiatric:unable to assess . Neurologic: no seizure no involuntary movements         Lab Data:   Basic Metabolic Panel: Recent Labs  Lab 10/09/17 0539 10/11/17 0649 10/15/17 0608  NA  --  140 140  K 3.5 3.7 3.1*  CL  --  96* 97*  CO2  --  34* 36*  GLUCOSE  --  115* 126*  BUN  --  19 15  CREATININE  --  0.38* 0.34*  CALCIUM  --  8.4* 8.3*  MG  --   --  1.8    Liver Function Tests: No results for input(s): AST, ALT, ALKPHOS, BILITOT, PROT, ALBUMIN in the last 168 hours. No results for input(s): LIPASE, AMYLASE in the last 168 hours. No results for input(s): AMMONIA in the last 168 hours.  CBC: Recent Labs  Lab 10/10/17 1102 10/11/17 0649  10/15/17 0608  WBC 10.8* 8.7 11.8*  HGB 8.9* 7.8* 8.9*  HCT 29.6* 25.6* 29.4*  MCV 90.5 90.1 87.5  PLT 250 PLATELET CLUMPS NOTED ON SMEAR, UNABLE TO ESTIMATE 161    Cardiac Enzymes: No results for input(s): CKTOTAL, CKMB, CKMBINDEX, TROPONINI in the last 168 hours.  BNP (last 3 results) No results for input(s): BNP in the last 8760 hours.  ProBNP (last 3 results) No results for input(s): PROBNP in the last 8760 hours.  Radiological Exams: No results found.  Assessment/Plan Principal Problem:   Acute on chronic respiratory failure with hypoxia (HCC) Active Problems:   Disorders of diaphragm   Atrial fibrillation (HCC)   Incomplete quadriplegia at C5-6 level (HCC)   History of stroke   1. Acute on chronic respiratory failure with hypoxia we will continue with full support would like to try to switch her over to volume-cycled if possible.  Titrate oxygen as tolerated continue pulmonary toilet 2. Disorders of diaphragm hemidiaphragm paralysis patient not weaning secondary to weakness of the diaphragm. 3. Chronic atrial fibrillation rate is controlled 4. Incomplete quadriplegia stable supportive therapy 5. History of stroke at baseline   I have personally seen and evaluated the patient, evaluated laboratory and imaging results, formulated the assessment and plan and placed orders. The Patient requires high complexity decision making  for assessment and support.  Case was discussed on Rounds with the Respiratory Therapy Staff  Yevonne Pax, MD [facility] Pulmonary Critical Care Medicine Sleep Medicine

## 2017-10-15 NOTE — Progress Notes (Signed)
-   received consulted for PEG tube placement. Previously interventional radiology was consulted for PEG tube placement.  CT scan reviewed personally by me. Discussed with interventional radiology Dr. Barbie Banner. Unfortunately, patient's majority of the stomach  Is in the chest cavity. There is liver and transverse colon covering anterior abdomen. PEG tube by interventional radiology or through endoscopy by GI is not possible and somewhat contraindicated  - recommend surgical consultation for evaluation of surgical J-tube placement. D/W [facility] attending  -  Call us back if needed  Otis Brace MD, [place] 10/15/2017, 1:22 PM  Contact #  [phone]

## 2017-10-16 ENCOUNTER — Other Ambulatory Visit (HOSPITAL_COMMUNITY): Payer: Self-pay

## 2017-10-16 DIAGNOSIS — J986 Disorders of diaphragm: Secondary | ICD-10-CM | POA: Diagnosis not present

## 2017-10-16 DIAGNOSIS — J984 Other disorders of lung: Secondary | ICD-10-CM | POA: Diagnosis not present

## 2017-10-16 DIAGNOSIS — G8254 Quadriplegia, C5-C7 incomplete: Secondary | ICD-10-CM | POA: Diagnosis not present

## 2017-10-16 DIAGNOSIS — I480 Paroxysmal atrial fibrillation: Secondary | ICD-10-CM | POA: Diagnosis not present

## 2017-10-16 LAB — MISC LABCORP TEST (SEND OUT)

## 2017-10-16 LAB — BASIC METABOLIC PANEL
Anion gap: 9 (ref 5–15)
BUN: 16 mg/dL (ref 8–23)
CALCIUM: 8.1 mg/dL — AB (ref 8.9–10.3)
CO2: 32 mmol/L (ref 22–32)
Chloride: 96 mmol/L — ABNORMAL LOW (ref 98–111)
GLUCOSE: 123 mg/dL — AB (ref 70–99)
Potassium: 3.6 mmol/L (ref 3.5–5.1)
Sodium: 137 mmol/L (ref 135–145)

## 2017-10-16 LAB — MAGNESIUM: Magnesium: 1.7 mg/dL (ref 1.7–2.4)

## 2017-10-16 NOTE — Progress Notes (Signed)
Pulmonary [place]   PULMONARY SERVICE  PROGRESS NOTE  Date of Service: 10/16/2017  Lindsay Mills  [id number]  DOB: [birth year]   DOA: 09/18/2017  Referring Physician: Merton Border, MD  HPI: Lindsay Mills is a 72 y.o. female seen for follow up of Acute on Chronic Respiratory Failure.  Patient is on the ventilator not been able to wean.  Has been having issues with mucus plugging.  No recent chest x-ray noted  Medications: Reviewed on Rounds  Physical Exam:  Vitals: Temperature 98.7 pulse 87 respiratory 18 blood pressure 101/58 saturations 98%  Ventilator Settings mode of ventilation is assist control tidal volume 450 PEEP 5  . General: Comfortable at this time . Eyes: Grossly normal lids, irises & conjunctiva . ENT: grossly tongue is normal . Neck: no obvious mass . Cardiovascular: S1 S2 normal no gallop . Respiratory: No rhonchi rales . Abdomen: soft . Skin: no rash seen on limited exam . Musculoskeletal: not rigid . Psychiatric:unable to assess . Neurologic: no seizure no involuntary movements         Lab Data:   Basic Metabolic Panel: Recent Labs  Lab 10/11/17 0649 10/15/17 0608 10/16/17 0657  NA 140 140 137  K 3.7 3.1* 3.6  CL 96* 97* 96*  CO2 34* 36* 32  GLUCOSE 115* 126* 123*  BUN 19 15 16   CREATININE 0.38* 0.34* <0.30*  CALCIUM 8.4* 8.3* 8.1*  MG  --  1.8 1.7    Liver Function Tests: No results for input(s): AST, ALT, ALKPHOS, BILITOT, PROT, ALBUMIN in the last 168 hours. No results for input(s): LIPASE, AMYLASE in the last 168 hours. No results for input(s): AMMONIA in the last 168 hours.  CBC: Recent Labs  Lab 10/10/17 1102 10/11/17 0649 10/15/17 0608  WBC 10.8* 8.7 11.8*  HGB 8.9* 7.8* 8.9*  HCT 29.6* 25.6* 29.4*  MCV 90.5 90.1 87.5  PLT 250 PLATELET CLUMPS NOTED ON SMEAR, UNABLE TO ESTIMATE 161    Cardiac Enzymes: No results for input(s): CKTOTAL, CKMB, CKMBINDEX, TROPONINI in  the last 168 hours.  BNP (last 3 results) No results for input(s): BNP in the last 8760 hours.  ProBNP (last 3 results) No results for input(s): PROBNP in the last 8760 hours.  Radiological Exams: No results found.  Assessment/Plan Principal Problem:   Acute on chronic respiratory failure with hypoxia (HCC) Active Problems:   Disorders of diaphragm   Atrial fibrillation (HCC)   Incomplete quadriplegia at C5-6 level (HCC)   History of stroke   1. Acute on chronic respiratory failure with hypoxia we will continue with aggressive pulmonary toilet follow-up chest x-ray was ordered today.  Patient remains on full support has not been able to do any weaning 2. Diaphragmatic paralysis continue with pulmonary toilet secretion management prognosis guarded from a weaning perspective 3. Chronic atrial fibrillation rate is controlled 4. Incomplete quadriplegia we will continue full supportive care 5. Stroke rehab therapy 6. Mucous plugging as noted above chest x-ray has been ordered if there is significant atelectasis may need bronc   I have personally seen and evaluated the patient, evaluated laboratory and imaging results, formulated the assessment and plan and placed orders. The Patient requires high complexity decision making for assessment and support.  Case was discussed on Rounds with the Respiratory Therapy Staff  Allyne Gee, MD [facility] Pulmonary Critical Care Medicine Sleep Medicine

## 2017-10-17 DIAGNOSIS — J986 Disorders of diaphragm: Secondary | ICD-10-CM | POA: Diagnosis not present

## 2017-10-17 DIAGNOSIS — G8254 Quadriplegia, C5-C7 incomplete: Secondary | ICD-10-CM | POA: Diagnosis not present

## 2017-10-17 DIAGNOSIS — I480 Paroxysmal atrial fibrillation: Secondary | ICD-10-CM | POA: Diagnosis not present

## 2017-10-17 DIAGNOSIS — Z8673 Personal history of transient ischemic attack (TIA), and cerebral infarction without residual deficits: Secondary | ICD-10-CM | POA: Diagnosis not present

## 2017-10-17 NOTE — Progress Notes (Signed)
Pulmonary Critical Care Medicine [facility]   PULMONARY SERVICE  PROGRESS NOTE  Date of Service: 10/17/2017  Lindsay Mills  [id number]  DOB: [birth year]   DOA: 09/18/2017  Referring Physician: Carron Curie, MD  HPI: Lindsay Mills is a 72 y.o. female seen for follow up of Acute on Chronic Respiratory Failure.  Patient's husband and family were present in the room.  Had a very lengthy discussion with the patient's husband regarding her prognosis.  Explained to the patient's husband that she is not likely to come off of the ventilator.  She has significant muscular weakness and so therefore is not able to tolerate any attempts at spontaneous breathing.  She is indeed failed several attempts at home with spontaneous breathing trials.  I think most of it may be related to the fact that she also probably has intercostal muscle fatigue and weakness secondary to her C5-6 issue as well as her previous history of diaphragmatic paresis.  Both combined are probably making her weaning potential low  Medications: Reviewed on Rounds  Physical Exam:  Vitals: Temperature 98.9 pulse 112 respiratory rate 18 blood pressure 114/59 saturations 98%  Ventilator Settings mode of ventilation pressure assist control FiO2 40% tidal volume 364 PEEP 5  . General: Comfortable at this time . Eyes: Grossly normal lids, irises & conjunctiva . ENT: grossly tongue is normal . Neck: no obvious mass . Cardiovascular: S1 S2 normal no gallop . Respiratory: Scattered rhonchi no rales . Abdomen: soft . Skin: no rash seen on limited exam . Musculoskeletal: not rigid . Psychiatric:unable to assess . Neurologic: no seizure no involuntary movements         Lab Data:   Basic Metabolic Panel: Recent Labs  Lab 10/11/17 0649 10/15/17 0608 10/16/17 0657  NA 140 140 137  K 3.7 3.1* 3.6  CL 96* 97* 96*  CO2 34* 36* 32  GLUCOSE 115* 126* 123*  BUN 19 15 16   CREATININE 0.38* 0.34* <0.30*   CALCIUM 8.4* 8.3* 8.1*  MG  --  1.8 1.7    Liver Function Tests: No results for input(s): AST, ALT, ALKPHOS, BILITOT, PROT, ALBUMIN in the last 168 hours. No results for input(s): LIPASE, AMYLASE in the last 168 hours. No results for input(s): AMMONIA in the last 168 hours.  CBC: Recent Labs  Lab 10/11/17 0649 10/15/17 0608  WBC 8.7 11.8*  HGB 7.8* 8.9*  HCT 25.6* 29.4*  MCV 90.1 87.5  PLT PLATELET CLUMPS NOTED ON SMEAR, UNABLE TO ESTIMATE 161    Cardiac Enzymes: No results for input(s): CKTOTAL, CKMB, CKMBINDEX, TROPONINI in the last 168 hours.  BNP (last 3 results) No results for input(s): BNP in the last 8760 hours.  ProBNP (last 3 results) No results for input(s): PROBNP in the last 8760 hours.  Radiological Exams: Dg Chest Port 1 View  Result Date: 10/16/2017 CLINICAL DATA:  History of recent mucous plug, increasing oxygen support from ventilator EXAM: PORTABLE CHEST 1 VIEW COMPARISON:  Portable chest x-ray of 10/08/2017 FINDINGS: There is no change in the chronically elevated left hemidiaphragm with air within the splenic flexure beneath the hemidiaphragm. Mild volume loss at the left lung base is noted. Otherwise the lungs are clear. Mediastinal and hilar contours are unremarkable. The heart is within upper limits of normal. NG tube extends into the left upper quadrant, and loop recorder overlies the left anterior chest. Tracheostomy is unchanged in position. IMPRESSION: 1. No change in chronic elevation left hemidiaphragm with mild basilar volume  loss. 2. Tracheostomy and NG tube remain unchanged. Electronically Signed   By: Dwyane Dee M.D.   On: 10/16/2017 13:32    Assessment/Plan Principal Problem:   Acute on chronic respiratory failure with hypoxia (HCC) Active Problems:   Disorders of diaphragm   Atrial fibrillation (HCC)   Incomplete quadriplegia at C5-6 level (HCC)   History of stroke   1. Acute on chronic respiratory failure with hypoxia poor weaning  potential as noted above with overall respiratory muscle weakness.  The chest x-ray again shows low volumes.  Patient right now is going to be continued on full vent support.  The husband I think still does not comprehend the gravity of the situation and he states that he would be willing to continue on the ventilator for as long as it takes. 2. Diaphragmatic muscle paralysis we will continue with supportive care 3. Chronic atrial fibrillation rate is controlled 4. Incomplete quadriplegia overall has overall has significant weakness below the neck she does have some movement in the upper limbs. 5. History of stroke grossly unchanged continue with restorative therapy   I have personally seen and evaluated the patient, evaluated laboratory and imaging results, formulated the assessment and plan and placed orders. The Patient requires high complexity decision making for assessment and support.  Case was discussed on Rounds with the Respiratory Therapy Staff time 35 minutes extension discussion with the patient's family patient's husband  Yevonne Pax, MD [facility] Pulmonary Critical Care Medicine Sleep Medicine

## 2017-10-18 DIAGNOSIS — J986 Disorders of diaphragm: Secondary | ICD-10-CM | POA: Diagnosis not present

## 2017-10-18 DIAGNOSIS — I480 Paroxysmal atrial fibrillation: Secondary | ICD-10-CM | POA: Diagnosis not present

## 2017-10-18 DIAGNOSIS — G8254 Quadriplegia, C5-C7 incomplete: Secondary | ICD-10-CM | POA: Diagnosis not present

## 2017-10-18 LAB — CBC
HEMATOCRIT: 34.7 % — AB (ref 36.0–46.0)
Hemoglobin: 10 g/dL — ABNORMAL LOW (ref 12.0–15.0)
MCH: 26.4 pg (ref 26.0–34.0)
MCHC: 28.8 g/dL — AB (ref 30.0–36.0)
MCV: 91.6 fL (ref 78.0–100.0)
PLATELETS: 227 10*3/uL (ref 150–400)
RBC: 3.79 MIL/uL — ABNORMAL LOW (ref 3.87–5.11)
RDW: 17.7 % — AB (ref 11.5–15.5)
WBC: 11.3 10*3/uL — AB (ref 4.0–10.5)

## 2017-10-18 LAB — BLOOD GAS, ARTERIAL
Acid-Base Excess: 9.4 mmol/L — ABNORMAL HIGH (ref 0.0–2.0)
BICARBONATE: 34.4 mmol/L — AB (ref 20.0–28.0)
FIO2: 0.35
O2 SAT: 91.8 %
PATIENT TEMPERATURE: 97.9
PCO2 ART: 55.1 mmHg — AB (ref 32.0–48.0)
PEEP: 5 cmH2O
PO2 ART: 60.5 mmHg — AB (ref 83.0–108.0)
RATE: 18 resp/min
pH, Arterial: 7.41 (ref 7.350–7.450)

## 2017-10-18 LAB — BASIC METABOLIC PANEL
Anion gap: 10 (ref 5–15)
BUN: 23 mg/dL (ref 8–23)
CALCIUM: 8.6 mg/dL — AB (ref 8.9–10.3)
CO2: 36 mmol/L — AB (ref 22–32)
CREATININE: 0.5 mg/dL (ref 0.44–1.00)
Chloride: 92 mmol/L — ABNORMAL LOW (ref 98–111)
GFR calc Af Amer: >60 mL/min (ref >60)
Glucose, Bld: 178 mg/dL — ABNORMAL HIGH (ref 70–99)
Potassium: 4.7 mmol/L (ref 3.5–5.1)
Sodium: 138 mmol/L (ref 135–145)

## 2017-10-18 LAB — MAGNESIUM: Magnesium: 2 mg/dL (ref 1.7–2.4)

## 2017-10-18 LAB — URIC ACID: Uric Acid, Serum: 3.1 mg/dL (ref 2.5–7.1)

## 2017-10-18 LAB — PHOSPHORUS: Phosphorus: 4.4 mg/dL (ref 2.5–4.6)

## 2017-10-18 NOTE — Progress Notes (Signed)
Pulmonary [place]   PULMONARY SERVICE  PROGRESS NOTE  Date of Service: 10/18/2017  Lindsay Mills  [id number]  DOB: [birth year]   DOA: 09/18/2017  Referring Physician: Merton Border, MD  HPI: Lindsay Mills is a 72 y.o. female seen for follow up of Acute on Chronic Respiratory Failure.  Patient is on assist-control mode full support at this time.  She is on 60% oxygen.  Not mention any improvement.  Another family is now considering possibility of palliative care hospice care after our conversation yesterday.  Medications: Reviewed on Rounds  Physical Exam:  Vitals:   Temperature 97.7 degrees pulse 107 respiratory 22 blood pressure 98/51 saturations 100%  Ventilator Settings  Mode of ventilation assist-control FiO2 60% tidal volume 336 peep 5  . General: Comfortable at this time . Eyes: Grossly normal lids, irises & conjunctiva . ENT: grossly tongue is normal . Neck: no obvious mass . Cardiovascular: S1 S2 normal no gallop . Respiratory:  No rhonchi or rales are noted at this time. . Abdomen: soft . Skin: no rash seen on limited exam . Musculoskeletal: not rigid . Psychiatric:unable to assess . Neurologic: no seizure no involuntary movements         Lab Data:   Basic Metabolic Panel: Recent Labs  Lab 10/15/17 0608 10/16/17 0657 10/18/17 0655  NA 140 137 138  K 3.1* 3.6 4.7  CL 97* 96* 92*  CO2 36* 32 36*  GLUCOSE 126* 123* 178*  BUN 15 16 23   CREATININE 0.34* <0.30* 0.50  CALCIUM 8.3* 8.1* 8.6*  MG 1.8 1.7 2.0  PHOS  --   --  4.4    Liver Function Tests: No results for input(s): AST, ALT, ALKPHOS, BILITOT, PROT, ALBUMIN in the last 168 hours. No results for input(s): LIPASE, AMYLASE in the last 168 hours. No results for input(s): AMMONIA in the last 168 hours.  CBC: Recent Labs  Lab 10/15/17 0608 10/18/17 0655  WBC 11.8* 11.3*  HGB 8.9* 10.0*  HCT 29.4* 34.7*  MCV 87.5 91.6  PLT 161 227     Cardiac Enzymes: No results for input(s): CKTOTAL, CKMB, CKMBINDEX, TROPONINI in the last 168 hours.  BNP (last 3 results) No results for input(s): BNP in the last 8760 hours.  ProBNP (last 3 results) No results for input(s): PROBNP in the last 8760 hours.  Radiological Exams: No results found.  Assessment/Plan Principal Problem:   Acute on chronic respiratory failure with hypoxia (HCC) Active Problems:   Disorders of diaphragm   Atrial fibrillation (HCC)   Incomplete quadriplegia at C5-6 level (HCC)   History of stroke   1.   Acute on chronic Respiratory failure with hypoxia she continues on full support not able to wean.  We will continue with assist-control mode peep of 5 2. C5-6 quadriplegia will continue with present management prognosis guarded.   3. Diaphragmatic paralysis prognosis guarded as discussed yesterday poor prognosis for weaning 4. Chronic atrial fibrillation rate is controlled 5. History of stroke at baseline   I have personally seen and evaluated the patient, evaluated laboratory and imaging results, formulated the assessment and plan and placed orders. The Patient requires high complexity decision making for assessment and support.  Case was discussed on Rounds with the Respiratory Therapy Staff  Allyne Gee, MD [facility] Pulmonary Critical Care Medicine Sleep Medicine

## 2017-10-19 LAB — BASIC METABOLIC PANEL
Anion gap: 6 (ref 5–15)
BUN: 25 mg/dL — ABNORMAL HIGH (ref 8–23)
CHLORIDE: 97 mmol/L — AB (ref 98–111)
CO2: 35 mmol/L — AB (ref 22–32)
Calcium: 8.3 mg/dL — ABNORMAL LOW (ref 8.9–10.3)
Creatinine, Ser: 0.34 mg/dL — ABNORMAL LOW (ref 0.44–1.00)
GFR calc Af Amer: >60 mL/min (ref >60)
GFR calc non Af Amer: >60 mL/min (ref >60)
GLUCOSE: 121 mg/dL — AB (ref 70–99)
POTASSIUM: 4 mmol/L (ref 3.5–5.1)
Sodium: 138 mmol/L (ref 135–145)

## 2017-10-19 NOTE — Progress Notes (Signed)
Pulmonary [place]   PULMONARY SERVICE  PROGRESS NOTE  Date of Service: 10/19/2017  Lindsay Mills  [id number]  DOB: Apr 13, 1945   DOA: 09/18/2017  Referring Physician: Merton Border, MD  HPI: Lindsay Mills is a 72 y.o. female seen for follow up of Acute on Chronic Respiratory Failure.  Patient remains on full support currently is on assist control mode has been on 40% oxygen.  Patient is supposed to have a PEG placed next week.  Apparently now the family wants everything done and the husband's plan is to take her to rehab facility after the PEG is done.  Medications: Reviewed on Rounds  Physical Exam:  Vitals: Temperature 98.6 pulse 101 respiratory rate 19 blood pressure 90/52 saturations 90%  Ventilator Settings currently mode of ventilation is assist control FiO2 40% tidal volume 398 PEEP 5  . General: Comfortable at this time . Eyes: Grossly normal lids, irises & conjunctiva . ENT: grossly tongue is normal . Neck: no obvious mass . Cardiovascular: S1 S2 normal no gallop . Respiratory: Coarse scattered rhonchi noted . Abdomen: soft . Skin: no rash seen on limited exam . Musculoskeletal: not rigid . Psychiatric:unable to assess . Neurologic: no seizure no involuntary movements         Lab Data:   Basic Metabolic Panel: Recent Labs  Lab 10/15/17 0608 10/16/17 0657 10/18/17 0655 10/19/17 0356  NA 140 137 138 138  K 3.1* 3.6 4.7 4.0  CL 97* 96* 92* 97*  CO2 36* 32 36* 35*  GLUCOSE 126* 123* 178* 121*  BUN 15 16 23  25*  CREATININE 0.34* <0.30* 0.50 0.34*  CALCIUM 8.3* 8.1* 8.6* 8.3*  MG 1.8 1.7 2.0  --   PHOS  --   --  4.4  --     Liver Function Tests: No results for input(s): AST, ALT, ALKPHOS, BILITOT, PROT, ALBUMIN in the last 168 hours. No results for input(s): LIPASE, AMYLASE in the last 168 hours. No results for input(s): AMMONIA in the last 168 hours.  CBC: Recent Labs  Lab 10/15/17 0608  10/18/17 0655  WBC 11.8* 11.3*  HGB 8.9* 10.0*  HCT 29.4* 34.7*  MCV 87.5 91.6  PLT 161 227    Cardiac Enzymes: No results for input(s): CKTOTAL, CKMB, CKMBINDEX, TROPONINI in the last 168 hours.  BNP (last 3 results) No results for input(s): BNP in the last 8760 hours.  ProBNP (last 3 results) No results for input(s): PROBNP in the last 8760 hours.  Radiological Exams: No results found.  Assessment/Plan Principal Problem:   Acute on chronic respiratory failure with hypoxia (HCC) Active Problems:   Disorders of diaphragm   Atrial fibrillation (HCC)   Incomplete quadriplegia at C5-6 level (HCC)   History of stroke   1. Acute on chronic respiratory failure with hypoxia continues on full vent support.  Patient is not amenable.  We will continue present management. 2. Diaphragmatic paralysis stable at this time. 3. Chronic atrial fibrillation rate is controlled 4. Incomplete quadriplegia C5-6 we will continue present management. 5. History of stroke at baseline   I have personally seen and evaluated the patient, evaluated laboratory and imaging results, formulated the assessment and plan and placed orders. The Patient requires high complexity decision making for assessment and support.  Case was discussed on Rounds with the Respiratory Therapy Staff  Allyne Gee, MD [facility] Pulmonary Critical Care Medicine Sleep Medicine

## 2017-10-21 DIAGNOSIS — J986 Disorders of diaphragm: Secondary | ICD-10-CM | POA: Diagnosis not present

## 2017-10-21 DIAGNOSIS — G8254 Quadriplegia, C5-C7 incomplete: Secondary | ICD-10-CM | POA: Diagnosis not present

## 2017-10-21 DIAGNOSIS — I480 Paroxysmal atrial fibrillation: Secondary | ICD-10-CM | POA: Diagnosis not present

## 2017-10-21 NOTE — Progress Notes (Signed)
Pulmonary [place]   PULMONARY SERVICE  PROGRESS NOTE  Date of Service: 10/21/2017  Lindsay Mills  [id number]  DOB: 10/02/1945   DOA: 09/18/2017  Referring Physician: Merton Border, MD  HPI: Lindsay Mills is a 72 y.o. female seen for follow up of Acute on Chronic Respiratory Failure.  Patient is on assist control mode at this time no distress.  Remains on 20% FiO2.  She is not wean able off the ventilator she is failed multiple attempts at spontaneous breathing index  Medications: Reviewed on Rounds  Physical Exam:  Vitals: Temperature 99.7 pulse 118 respiratory 19 blood pressure 90/66 saturations 100%  Ventilator Settings mode of ventilation assist control FiO2 20% tidal volume 450 PEEP 5  . General: Comfortable at this time . Eyes: Grossly normal lids, irises & conjunctiva . ENT: grossly tongue is normal . Neck: no obvious mass . Cardiovascular: S1 S2 normal no gallop . Respiratory: Coarse rhonchi noted bilaterally . Abdomen: soft . Skin: no rash seen on limited exam . Musculoskeletal: not rigid . Psychiatric:unable to assess . Neurologic: no seizure no involuntary movements         Lab Data:   Basic Metabolic Panel: Recent Labs  Lab 10/15/17 0608 10/16/17 0657 10/18/17 0655 10/19/17 0356  NA 140 137 138 138  K 3.1* 3.6 4.7 4.0  CL 97* 96* 92* 97*  CO2 36* 32 36* 35*  GLUCOSE 126* 123* 178* 121*  BUN 15 16 23  25*  CREATININE 0.34* <0.30* 0.50 0.34*  CALCIUM 8.3* 8.1* 8.6* 8.3*  MG 1.8 1.7 2.0  --   PHOS  --   --  4.4  --     Liver Function Tests: No results for input(s): AST, ALT, ALKPHOS, BILITOT, PROT, ALBUMIN in the last 168 hours. No results for input(s): LIPASE, AMYLASE in the last 168 hours. No results for input(s): AMMONIA in the last 168 hours.  CBC: Recent Labs  Lab 10/15/17 0608 10/18/17 0655  WBC 11.8* 11.3*  HGB 8.9* 10.0*  HCT 29.4* 34.7*  MCV 87.5 91.6  PLT 161 227     Cardiac Enzymes: No results for input(s): CKTOTAL, CKMB, CKMBINDEX, TROPONINI in the last 168 hours.  BNP (last 3 results) No results for input(s): BNP in the last 8760 hours.  ProBNP (last 3 results) No results for input(s): PROBNP in the last 8760 hours.  Radiological Exams: No results found.  Assessment/Plan Principal Problem:   Acute on chronic respiratory failure with hypoxia (HCC) Active Problems:   Disorders of diaphragm   Atrial fibrillation (HCC)   Incomplete quadriplegia at C5-6 level (HCC)   History of stroke   1. Acute on chronic respiratory failure with hypoxia we will continue with full vent support right now Assist control mode has been requiring 28% FiO2.  Tidal volumes 450 PEEP 5 and continue supportive care 2. Diaphragmatic paralysis stable main reason for failure to wean. 3. Chronic atrial fibrillation rate is controlled 4. Incomplete quadriplegia C5-6 we will continue supportive care 5. History of stroke unchanged   I have personally seen and evaluated the patient, evaluated laboratory and imaging results, formulated the assessment and plan and placed orders. The Patient requires high complexity decision making for assessment and support.  Case was discussed on Rounds with the Respiratory Therapy Staff  Allyne Gee, MD [facility] Pulmonary Critical Care Medicine Sleep Medicine

## 2017-10-22 LAB — CBC
HCT: 27.8 % — ABNORMAL LOW (ref 36.0–46.0)
Hemoglobin: 8.2 g/dL — ABNORMAL LOW (ref 12.0–15.0)
MCH: 26.3 pg (ref 26.0–34.0)
MCHC: 29.5 g/dL — ABNORMAL LOW (ref 30.0–36.0)
MCV: 89.1 fL (ref 78.0–100.0)
PLATELETS: 181 10*3/uL (ref 150–400)
RBC: 3.12 MIL/uL — AB (ref 3.87–5.11)
RDW: 17.6 % — ABNORMAL HIGH (ref 11.5–15.5)
WBC: 10.9 10*3/uL — AB (ref 4.0–10.5)

## 2017-10-22 LAB — BASIC METABOLIC PANEL
ANION GAP: 10 (ref 5–15)
BUN: 23 mg/dL (ref 8–23)
CALCIUM: 8.2 mg/dL — AB (ref 8.9–10.3)
CO2: 31 mmol/L (ref 22–32)
Chloride: 98 mmol/L (ref 98–111)
Creatinine, Ser: 0.32 mg/dL — ABNORMAL LOW (ref 0.44–1.00)
GLUCOSE: 134 mg/dL — AB (ref 70–99)
POTASSIUM: 3.9 mmol/L (ref 3.5–5.1)
SODIUM: 139 mmol/L (ref 135–145)

## 2017-10-22 LAB — PHOSPHORUS: PHOSPHORUS: 2.2 mg/dL — AB (ref 2.5–4.6)

## 2017-10-22 LAB — MAGNESIUM: MAGNESIUM: 1.8 mg/dL (ref 1.7–2.4)

## 2017-10-22 NOTE — Progress Notes (Signed)
Pulmonary [place]   PULMONARY SERVICE  PROGRESS NOTE  Date of Service: 10/22/2017  Lindsay Mills  [id number]  DOB: Jan 16, 1946   DOA: 09/18/2017  Referring Physician: Merton Border, MD  HPI: Lindsay Mills is a 72 y.o. female seen for follow up of Acute on Chronic Respiratory Failure.  Patient is weaning this morning which she was placed on pressure support by respiratory therapy seems to be tolerating it fairly well so far.  Medications: Reviewed on Rounds  Physical Exam:  Vitals: Temperature 98.6 pulse 114 respiratory rate 22 blood pressure 115/67 saturations 100%  Ventilator Settings mode of ventilation pressure support FiO2 40% tidal volume 300 pressure support 16 PEEP 5  . General: Comfortable at this time . Eyes: Grossly normal lids, irises & conjunctiva . ENT: grossly tongue is normal . Neck: no obvious mass . Cardiovascular: S1 S2 normal no gallop . Respiratory: No rhonchi or rales are noted . Abdomen: soft . Skin: no rash seen on limited exam . Musculoskeletal: not rigid . Psychiatric:unable to assess . Neurologic: no seizure no involuntary movements         Lab Data:   Basic Metabolic Panel: Recent Labs  Lab 10/16/17 0657 10/18/17 0655 10/19/17 0356 10/22/17 0722  NA 137 138 138 139  K 3.6 4.7 4.0 3.9  CL 96* 92* 97* 98  CO2 32 36* 35* 31  GLUCOSE 123* 178* 121* 134*  BUN 16 23 25* 23  CREATININE <0.30* 0.50 0.34* 0.32*  CALCIUM 8.1* 8.6* 8.3* 8.2*  MG 1.7 2.0  --  1.8  PHOS  --  4.4  --  2.2*    Liver Function Tests: No results for input(s): AST, ALT, ALKPHOS, BILITOT, PROT, ALBUMIN in the last 168 hours. No results for input(s): LIPASE, AMYLASE in the last 168 hours. No results for input(s): AMMONIA in the last 168 hours.  CBC: Recent Labs  Lab 10/18/17 0655 10/22/17 0722  WBC 11.3* 10.9*  HGB 10.0* 8.2*  HCT 34.7* 27.8*  MCV 91.6 89.1  PLT 227 181    Cardiac Enzymes: No  results for input(s): CKTOTAL, CKMB, CKMBINDEX, TROPONINI in the last 168 hours.  BNP (last 3 results) No results for input(s): BNP in the last 8760 hours.  ProBNP (last 3 results) No results for input(s): PROBNP in the last 8760 hours.  Radiological Exams: No results found.  Assessment/Plan Principal Problem:   Acute on chronic respiratory failure with hypoxia (HCC) Active Problems:   Disorders of diaphragm   Atrial fibrillation (HCC)   Incomplete quadriplegia at C5-6 level (HCC)   History of stroke   1. Acute on chronic respiratory failure with hypoxia we will continue with pressure support mode continue pulmonary toilet and follow along. 2. Diaphragmatic paralysis at baseline we will continue present management 3. Chronic atrial fibrillation rate is controlled 4. Incomplete C5-6 quadriplegia once again which shall be the limiting factor as far as being able to wean Exline  5. history of stroke unchanged   I have personally seen and evaluated the patient, evaluated laboratory and imaging results, formulated the assessment and plan and placed orders. The Patient requires high complexity decision making for assessment and support.  Case was discussed on Rounds with the Respiratory Therapy Staff  Allyne Gee, MD [facility] Pulmonary Critical Care Medicine Sleep Medicine

## 2017-10-23 NOTE — Progress Notes (Signed)
Pulmonary [place]   PULMONARY SERVICE  PROGRESS NOTE  Date of Service: 10/23/2017  Lindsay Mills  JJO:[id number]  DOB: Mar 21, 1946   DOA: 09/18/2017  Referring Physician: Merton Border, MD  HPI: Lindsay Mills is a 72 y.o. female seen for follow up of Acute on Chronic Respiratory Failure.  Patient remains on pressure assist control mode has been on 40% oxygen currently is with a PEEP of 5 patient has not been able to do any kind of weaning was attempted on pressure support of 16 yesterday with some success  Medications: Reviewed on Rounds  Physical Exam:  Vitals: Temperature 97.6 pulse 86 respiratory rate 18 blood pressure 132/82 saturations 100%  Ventilator Settings mode of ventilation pressure assist control FiO2 40% tidal volume 374 PEEP 5  . General: Comfortable at this time . Eyes: Grossly normal lids, irises & conjunctiva . ENT: grossly tongue is normal . Neck: no obvious mass . Cardiovascular: S1 S2 normal no gallop . Respiratory: No rhonchi or rales are noted at this time. . Abdomen: soft . Skin: no rash seen on limited exam . Musculoskeletal: not rigid . Psychiatric:unable to assess . Neurologic: no seizure no involuntary movements         Lab Data:   Basic Metabolic Panel: Recent Labs  Lab 10/18/17 0655 10/19/17 0356 10/22/17 0722  NA 138 138 139  K 4.7 4.0 3.9  CL 92* 97* 98  CO2 36* 35* 31  GLUCOSE 178* 121* 134*  BUN 23 25* 23  CREATININE 0.50 0.34* 0.32*  CALCIUM 8.6* 8.3* 8.2*  MG 2.0  --  1.8  PHOS 4.4  --  2.2*    Liver Function Tests: No results for input(s): AST, ALT, ALKPHOS, BILITOT, PROT, ALBUMIN in the last 168 hours. No results for input(s): LIPASE, AMYLASE in the last 168 hours. No results for input(s): AMMONIA in the last 168 hours.  CBC: Recent Labs  Lab 10/18/17 0655 10/22/17 0722  WBC 11.3* 10.9*  HGB 10.0* 8.2*  HCT 34.7* 27.8*  MCV 91.6 89.1  PLT 227 181     Cardiac Enzymes: No results for input(s): CKTOTAL, CKMB, CKMBINDEX, TROPONINI in the last 168 hours.  BNP (last 3 results) No results for input(s): BNP in the last 8760 hours.  ProBNP (last 3 results) No results for input(s): PROBNP in the last 8760 hours.  Radiological Exams: No results found.  Assessment/Plan Principal Problem:   Acute on chronic respiratory failure with hypoxia (HCC) Active Problems:   Disorders of diaphragm   Atrial fibrillation (HCC)   Incomplete quadriplegia at C5-6 level (HCC)   History of stroke   1. Acute on chronic respiratory failure with hypoxia patient is doing fairly well however is not able to wean off the ventilator.  The patient will be continued on full vent support. 2. Diaphragmatic paralysis at baseline 3. Chronic atrial fibrillation rate is controlled 4. Incomplete quadriplegia supportive care restorative therapy 5. History of stroke at baseline   I have personally seen and evaluated the patient, evaluated laboratory and imaging results, formulated the assessment and plan and placed orders. The Patient requires high complexity decision making for assessment and support.  Case was discussed on Rounds with the Respiratory Therapy Staff  Allyne Gee, MD [facility] Pulmonary Critical Care Medicine Sleep Medicine

## 2017-10-24 ENCOUNTER — Encounter
Admission: RE | Disposition: A | Discharge status: Home / Self Care | Payer: Self-pay | Source: Ambulatory Visit | Attending: Internal Medicine

## 2017-10-24 ENCOUNTER — Encounter: Payer: Self-pay | Admitting: Anesthesiology

## 2017-10-24 ENCOUNTER — Encounter (HOSPITAL_COMMUNITY): Payer: Self-pay | Admitting: Anesthesiology

## 2017-10-24 ENCOUNTER — Institutional Professional Consult (permissible substitution): Admission: AD | Admit: 2017-10-24 | Payer: Medicare Other | Source: Ambulatory Visit | Admitting: Internal Medicine

## 2017-10-24 HISTORY — PX: LAPAROSCOPIC INSERTION GASTROSTOMY TUBE: SHX6817

## 2017-10-24 LAB — PHOSPHORUS: Phosphorus: 2.9 mg/dL (ref 2.5–4.6)

## 2017-10-24 LAB — BASIC METABOLIC PANEL
Anion gap: 9 (ref 5–15)
BUN: 18 mg/dL (ref 8–23)
CO2: 32 mmol/L (ref 22–32)
CREATININE: 0.37 mg/dL — AB (ref 0.44–1.00)
Calcium: 8.2 mg/dL — ABNORMAL LOW (ref 8.9–10.3)
Chloride: 98 mmol/L (ref 98–111)
GFR calc Af Amer: >60 mL/min (ref >60)
GFR calc non Af Amer: >60 mL/min (ref >60)
Glucose, Bld: 95 mg/dL (ref 70–99)
POTASSIUM: 3.5 mmol/L (ref 3.5–5.1)
SODIUM: 139 mmol/L (ref 135–145)

## 2017-10-24 LAB — MAGNESIUM: MAGNESIUM: 1.9 mg/dL (ref 1.7–2.4)

## 2017-10-24 SURGERY — INSERTION, GASTROSTOMY TUBE, PERCUTANEOUS
Anesthesia: General | Site: Abdomen

## 2017-10-24 MED ORDER — DEXAMETHASONE SODIUM PHOSPHATE 10 MG/ML IJ SOLN
INTRAMUSCULAR | Status: DC | PRN
  Administered 2017-10-24: 10 mg via INTRAVENOUS

## 2017-10-24 MED ORDER — CEFAZOLIN SODIUM 1 G IJ SOLR
INTRAMUSCULAR | Status: AC
  Filled 2017-10-24: qty 20

## 2017-10-24 MED ORDER — ONDANSETRON HCL 4 MG/2ML IJ SOLN
INTRAMUSCULAR | Status: DC | PRN
  Administered 2017-10-24: 4 mg via INTRAVENOUS

## 2017-10-24 MED ORDER — 0.9 % SODIUM CHLORIDE (POUR BTL) OPTIME
TOPICAL | Status: DC | PRN
  Administered 2017-10-24: 1000 mL

## 2017-10-24 MED ORDER — CEFAZOLIN SODIUM-DEXTROSE 2-3 GM-%(50ML) IV SOLR
INTRAVENOUS | Status: DC | PRN
  Administered 2017-10-24: 2 g via INTRAVENOUS

## 2017-10-24 MED ORDER — DEXAMETHASONE SODIUM PHOSPHATE 10 MG/ML IJ SOLN
INTRAMUSCULAR | Status: AC
  Filled 2017-10-24: qty 1

## 2017-10-24 MED ORDER — EPHEDRINE SULFATE 50 MG/ML IJ SOLN
INTRAMUSCULAR | Status: AC
  Filled 2017-10-24: qty 1

## 2017-10-24 MED ORDER — PROPOFOL 10 MG/ML IV BOLUS
INTRAVENOUS | Status: AC
Start: 2017-10-24 — End: ?
  Filled 2017-10-24: qty 20

## 2017-10-24 MED ORDER — SUGAMMADEX SODIUM 200 MG/2ML IV SOLN
INTRAVENOUS | Status: AC
  Filled 2017-10-24: qty 2

## 2017-10-24 MED ORDER — LACTATED RINGERS IV SOLN
INTRAVENOUS | Status: DC | PRN
  Administered 2017-10-24: 09:00:00 via INTRAVENOUS

## 2017-10-24 MED ORDER — FENTANYL CITRATE (PF) 100 MCG/2ML IJ SOLN
INTRAMUSCULAR | Status: DC | PRN
  Administered 2017-10-24 (×3): 50 ug via INTRAVENOUS

## 2017-10-24 MED ORDER — ONDANSETRON HCL 4 MG/2ML IJ SOLN
INTRAMUSCULAR | Status: AC
  Filled 2017-10-24: qty 2

## 2017-10-24 MED ORDER — SODIUM CHLORIDE 0.9 % IJ SOLN
INTRAMUSCULAR | Status: AC
  Filled 2017-10-24: qty 10

## 2017-10-24 MED ORDER — STERILE WATER FOR IRRIGATION IR SOLN
Status: DC | PRN
  Administered 2017-10-24: 1000 mL

## 2017-10-24 MED ORDER — FENTANYL CITRATE (PF) 250 MCG/5ML IJ SOLN
INTRAMUSCULAR | Status: AC
Start: 2017-10-24 — End: ?
  Filled 2017-10-24: qty 5

## 2017-10-24 MED ORDER — SUGAMMADEX SODIUM 200 MG/2ML IV SOLN
INTRAVENOUS | Status: DC | PRN
  Administered 2017-10-24: 150 mg via INTRAVENOUS

## 2017-10-24 MED ORDER — ROCURONIUM BROMIDE 100 MG/10ML IV SOLN
INTRAVENOUS | Status: DC | PRN
  Administered 2017-10-24: 10 mg via INTRAVENOUS
  Administered 2017-10-24: 15 mg via INTRAVENOUS
  Administered 2017-10-24: 35 mg via INTRAVENOUS

## 2017-10-24 MED ORDER — ROCURONIUM BROMIDE 10 MG/ML (PF) SYRINGE
PREFILLED_SYRINGE | INTRAVENOUS | Status: AC
  Filled 2017-10-24: qty 10

## 2017-10-24 MED ORDER — SODIUM CHLORIDE 0.9 % IV SOLN
INTRAVENOUS | Status: DC | PRN
  Administered 2017-10-24: 50 ug/min via INTRAVENOUS

## 2017-10-24 MED ORDER — BUPIVACAINE-EPINEPHRINE 0.25% -1:200000 IJ SOLN
INTRAMUSCULAR | Status: DC | PRN
  Administered 2017-10-24: 16 mL

## 2017-10-24 SURGICAL SUPPLY — 45 items
BAG URINE DRAINAGE (UROLOGICAL SUPPLIES) IMPLANT
BENZOIN TINCTURE PRP APPL 2/3 (GAUZE/BANDAGES/DRESSINGS) ×1 IMPLANT
BLADE CLIPPER SURG (BLADE) IMPLANT
CANISTER SUCT 3000ML PPV (MISCELLANEOUS) ×4 IMPLANT
CATH FOLEY 2WAY 5CC 20FR (CATHETERS) IMPLANT
CHLORAPREP W/TINT 26ML (MISCELLANEOUS) ×4 IMPLANT
COVER SURGICAL LIGHT HANDLE (MISCELLANEOUS) ×4 IMPLANT
DECANTER SPIKE VIAL GLASS SM (MISCELLANEOUS) ×4 IMPLANT
DERMABOND ADHESIVE PROPEN (GAUZE/BANDAGES/DRESSINGS) ×2
DERMABOND ADVANCED .7 DNX6 (GAUZE/BANDAGES/DRESSINGS) ×1 IMPLANT
DEVICE TROCAR PUNCTURE CLOSURE (ENDOMECHANICALS) ×3 IMPLANT
DRAPE LAPAROSCOPIC ABDOMINAL (DRAPES) ×1 IMPLANT
DRAPE UTILITY XL STRL (DRAPES) ×2 IMPLANT
ELECT REM PT RETURN 9FT ADLT (ELECTROSURGICAL) ×4
ELECTRODE REM PT RTRN 9FT ADLT (ELECTROSURGICAL) ×2 IMPLANT
GLOVE BIOGEL M STRL SZ7.5 (GLOVE) ×7 IMPLANT
GLOVE BIOGEL PI IND STRL 8 (GLOVE) ×4 IMPLANT
GLOVE BIOGEL PI INDICATOR 8 (GLOVE) ×4
GOWN EXTRA PROTECTION XXL 0583 (GOWNS) ×4 IMPLANT
GOWN STRL REUS W/ TWL LRG LVL3 (GOWN DISPOSABLE) ×6 IMPLANT
GOWN STRL REUS W/TWL LRG LVL3 (GOWN DISPOSABLE) ×6
KIT BASIN OR (CUSTOM PROCEDURE TRAY) ×4 IMPLANT
KIT TURNOVER KIT B (KITS) ×4 IMPLANT
NS IRRIG 1000ML POUR BTL (IV SOLUTION) ×4 IMPLANT
PAD ARMBOARD 7.5X6 YLW CONV (MISCELLANEOUS) ×8 IMPLANT
PLUG CATH AND CAP STER (CATHETERS) IMPLANT
SCISSORS LAP 5X35 DISP (ENDOMECHANICALS) ×3 IMPLANT
SET GASTRO INIT PLACEMENT MED (SET/KITS/TRAYS/PACK) ×3 IMPLANT
SET IRRIG TUBING LAPAROSCOPIC (IRRIGATION / IRRIGATOR) IMPLANT
SPECIMEN JAR SMALL (MISCELLANEOUS) ×4 IMPLANT
SUT ETHILON 2 0 FS 18 (SUTURE) ×1 IMPLANT
SUT ETHILON 3 0 FSL (SUTURE) ×6 IMPLANT
SUT MNCRL AB 4-0 PS2 18 (SUTURE) ×6 IMPLANT
SUT PDS AB 3-0 SH 27 (SUTURE) ×9 IMPLANT
SUT SILK 2 0 PERMA HAND 18 BK (SUTURE) ×3 IMPLANT
SUT VIC AB 4-0 PS2 27 (SUTURE) ×1 IMPLANT
TOWEL OR 17X24 6PK STRL BLUE (TOWEL DISPOSABLE) ×4 IMPLANT
TOWEL OR 17X26 10 PK STRL BLUE (TOWEL DISPOSABLE) ×4 IMPLANT
TRAY LAPAROSCOPIC MC (CUSTOM PROCEDURE TRAY) ×4 IMPLANT
TROCAR XCEL BLUNT TIP 100MML (ENDOMECHANICALS) ×3 IMPLANT
TROCAR XCEL NON-BLD 11X100MML (ENDOMECHANICALS) IMPLANT
TROCAR XCEL NON-BLD 5MMX100MML (ENDOMECHANICALS) ×9 IMPLANT
TUBE GASTROSTOMY ENTUIT 18FR (TUBING) ×3 IMPLANT
TUBING INSUFFLATION (TUBING) ×4 IMPLANT
WATER STERILE IRR 1000ML POUR (IV SOLUTION) ×4 IMPLANT

## 2017-10-24 NOTE — Anesthesia Procedure Notes (Signed)
Date/Time: 10/24/2017 8:36 AM Performed by: Neldon Newport, CRNA Pre-anesthesia Checklist: Timeout performed, Patient being monitored, Suction available, Emergency Drugs available and Patient identified Patient Re-evaluated:Patient Re-evaluated prior to induction Oxygen Delivery Method: Circle system utilized Induction Type: Tracheostomy Placement Confirmation: breath sounds checked- equal and bilateral and positive ETCO2

## 2017-10-24 NOTE — H&P (Signed)
Lindsay Mills is an 72 y.o. female.   Chief Complaint: here for G tube HPI: Patient has a very complicated history with a history of spinal cord injury incomplete quadriplegia chronic atrial fibrillation.  Patient apparently was at home fell and was trying to get up and her knee gave out.  Patient hit the back of her head neck and upper back.  Came in with initially no sign of any head trauma.  Patient subsequently had decreased sensation in both legs.  She was not able to feel things as well.  Came into the emergency room and was noted to have fracture C4-7 underwent an emergent fusion surgery at [facility].  Patient was on the ventilator not able to come off the ventilator and had to have a tracheostomy done.  This was done on Sep 06, 2017.  Attempted PEG at [facility] was aborted due to ileus as well as retained tube feeds and stomach area. Postoperatively there is been some improvement in her left leg only but no other improvement neurologically.  [facility]  She was evaluated by radiology and GI and there was no window for PEG procedure based on imaging.  We are therefore asked to place a surgical feeding tube. She has been tolerating enteral feeds via nasogastric tube for quite some time.  Just on prophylactic subcutaneous heparin and no systemic anticoagulation.    Past Medical History:  Diagnosis Date  . Acute on chronic respiratory failure with hypoxia ([place]) 09/20/2017  . Asthma   . Decreased hearing of right ear    not dx  . Depression   . Headache    otc med prn  . History of stroke 09/20/2017  . Hypothyroidism   . Incomplete quadriplegia at C5-6 level ([place]) 09/20/2017  . Multiple allergies   . Scoliosis   . Seizures ([place])    well controlled on medication, last seizure at age 89  . Stroke ([place]) 08/2013   mini stroke, no problems but has some numbness on right check and lower right arm, did not affect her strength     . SVD (spontaneous vaginal delivery)    x 1    Past Surgical History:  Procedure Laterality Date  . BACK SURGERY     x 2, rods in back from shoulder blades to hips  . COLONOSCOPY    . DILATION AND CURETTAGE OF UTERUS    . ganglion cyst removed     right ring finer  . HYSTEROSCOPY W/D&C N/A 10/23/2014   Procedure: DILATATION AND CURETTAGE /HYSTEROSCOPY;  Surgeon: Allyn Kenner, DO;  Location: [facility];  Service: Gynecology;  Laterality: N/A;  . left foot surgery  2006  . right foot surgery  2014  . TUBAL LIGATION  1981  . UPPER GI ENDOSCOPY      History reviewed. No pertinent family history. Social History:  reports that she quit smoking about 42 years ago. Her smoking use included cigarettes. She has a 2.25 pack-year smoking history. She has never used smokeless tobacco. She reports that she does not drink alcohol or use drugs.  Allergies:  Allergies  Allergen Reactions  . Codeine Anxiety  . Phenytoin Rash    Medications Prior to Admission  Medication Sig Dispense Refill  . albuterol (PROVENTIL HFA;VENTOLIN HFA) 108 (90 BASE) MCG/ACT inhaler Inhale 1 puff into the lungs every 6 (six) hours as needed for wheezing or shortness of breath.    Marland Kitchen apixaban (ELIQUIS) 5 MG  TABS tablet Take 1 tablet (5 mg total) by mouth 2 (two) times daily. 60 tablet 1  . atorvastatin (LIPITOR) 40 MG tablet Take 1 tablet by mouth at bedtime.   6  . b complex vitamins tablet Take 1 tablet by mouth daily.    . budesonide-formoterol (SYMBICORT) 80-4.5 MCG/ACT inhaler Inhale 2 puffs into the lungs 2 (two) times daily.    . Calcium Carbonate-Vitamin D (CALCIUM-D) 600-400 MG-UNIT TABS Take 2 capsules by mouth 2 (two) times daily.    . flintstones complete (FLINTSTONES) 60 MG chewable tablet Chew 1 tablet by mouth daily.    . fluticasone (FLONASE) 50 MCG/ACT nasal spray Place 1 spray into both nostrils 2 (two) times daily.    . folic acid (FOLVITE) 1 MG tablet Take 1 mg by mouth daily.  6  . levothyroxine  (SYNTHROID, LEVOTHROID) 100 MCG tablet Take 100 mcg by mouth every other day. Rotates with levothyroxine 125 mcg.    . montelukast (SINGULAIR) 10 MG tablet Take 10 mg by mouth daily.    . primidone (MYSOLINE) 250 MG tablet Take 250 mg by mouth 2 (two) times daily.    . sertraline (ZOLOFT) 100 MG tablet Take 150 mg by mouth daily.    . VESICARE 5 MG tablet Take 5 mg by mouth at bedtime.   3  . vitamin B-12 (CYANOCOBALAMIN) 1000 MCG tablet Take 1,000 mcg by mouth daily.    . Vitamin D, Ergocalciferol, (DRISDOL) 50000 UNITS CAPS capsule Take 1 capsule by mouth once a week.  8    Results for orders placed or performed during the hospital encounter of 09/18/17 (from the past 48 hour(s))  Basic metabolic panel     Status: Abnormal   Collection Time: 10/24/17  5:28 AM  Result Value Ref Range   Sodium 139 135 - 145 mmol/L   Potassium 3.5 3.5 - 5.1 mmol/L   Chloride 98 98 - 111 mmol/L    Comment: Please note change in reference range.   CO2 32 22 - 32 mmol/L   Glucose, Bld 95 70 - 99 mg/dL    Comment: Please note change in reference range.   BUN 18 8 - 23 mg/dL    Comment: Please note change in reference range.   Creatinine, Ser 0.37 (L) 0.44 - 1.00 mg/dL   Calcium 8.2 (L) 8.9 - 10.3 mg/dL   GFR calc non Af Amer >60 >60 mL/min   GFR calc Af Amer >60 >60 mL/min    Comment: (NOTE) The eGFR has been calculated using the CKD EPI equation. This calculation has not been validated in all clinical situations. eGFR's persistently <60 mL/min signify possible Chronic Kidney Disease.    Anion gap 9 5 - 15    Comment: Performed at [facility] [address] [place], [place] [address]  Magnesium     Status: None   Collection Time: 10/24/17  5:28 AM  Result Value Ref Range   Magnesium 1.9 1.7 - 2.4 mg/dL    Comment: Performed at [facility], [place] [address] [place], [place] [address]  Phosphorus     Status: None   Collection Time: 10/24/17  5:28 AM  Result Value Ref Range    Phosphorus 2.9 2.5 - 4.6 mg/dL    Comment: Performed at [facility] [address], [place], [place] [address]   No results found.  Review of Systems  Unable to perform ROS: Intubated    There were no vitals taken  for this visit. Physical Exam  Vitals reviewed. Constitutional: She appears well-developed and well-nourished. No distress.  A little frail appearing; thin; some temporal wasting  HENT:  Head: Normocephalic and atraumatic.  Right Ear: External ear normal.  Left Ear: External ear normal.  Eyes: Conjunctivae are normal. No scleral icterus.  Neck: Neck supple. No tracheal deviation present. No thyromegaly present.  +trach with some thick yellowish secretions around trach site  Cardiovascular: Normal rate and normal heart sounds.  Respiratory: Effort normal. No stridor. No respiratory distress. She has wheezes (mild RLL exp wheeze o/w cta).  GI: Soft. She exhibits no distension. There is no rebound and no guarding.    Soft, flat abd. Lower midline incision - mouthes from back surgery  Musculoskeletal: She exhibits no edema or tenderness.  No real mvmt b/l LE. Very poor strength in UE.   Lymphadenopathy:    She has no cervical adenopathy.  Neurological: She is alert. She exhibits normal muscle tone. GCS eye subscore is 4. GCS verbal subscore is 5. GCS motor subscore is 6.  Decreased mvmt b/l LE; poor strength b/l UE  Skin: Skin is warm and dry. No rash noted. She is not diaphoretic. No erythema. There is pallor.  Psychiatric: She has a normal mood and affect. Her behavior is normal. Judgment and thought content normal.     Assessment/Plan 72 year old female status post fall with cervical fractures requiring fixation resulting in incomplete quadriplegia History of atrial fibrillation Vent dependent respiratory failure History of mild CVA 2015 Moderate protein calorie malnutrition Chronic anemia  We will plan laparoscopic assisted percutaneous endoscopic  gastrotomy tube placement.  We will also plan as a back-up pair laparoscopic gastrostomy tube versus open gastrostomy tube.  Husband was at bedside.  Explained surgical steps of the procedure including access to the abdomen laparoscopically.  We will surveilled the abdomen and see if we can proceed with lap assisted PEG placement.  If not we will attempt to do laparoscopic G-tube placement.  If that is not an option we will do a classic upper midline open Stamm gastrostomy tube placement  We discussed risk and benefits of the surgery including but not limited to bleeding, infection, injury to surrounding structures, blood clot formation, perioperative cardiac and pulmonary events, ileus, G-tube issue such as skin irritation breakdown, G-tube clogging.  G-tube dislodgment, bumper syndrome, gastrocutaneous fistula, ileus, pneumoperitoneum.  All of his questions were asked and answered.  We will [facility].  Leighton Ruff. Redmond Pulling, MD, FACS General, Bariatric, & Minimally Invasive Surgery [facility] Surgery, [place]   Greer Pickerel, MD 10/24/2017, 8:47 AM

## 2017-10-24 NOTE — Anesthesia Postprocedure Evaluation (Signed)
Anesthesia Post Note  Patient: Lindsay Mills  Procedure(s) Performed: LAPAROSCOPIC INSERTION GASTROSTOMY TUBE (N/A Abdomen)     Patient location during evaluation: PACU Anesthesia Type: General Level of consciousness: awake and alert Pain management: pain level controlled Vital Signs Assessment: post-procedure vital signs reviewed and stable Respiratory status: spontaneous breathing, nonlabored ventilation, respiratory function stable and patient connected to nasal cannula oxygen Cardiovascular status: blood pressure returned to baseline and stable Postop Assessment: no apparent nausea or vomiting Anesthetic complications: no    Last Vitals: There were no vitals filed for this visit.  Last Pain: There were no vitals filed for this visit.               Jenya Putz S

## 2017-10-24 NOTE — Transfer of Care (Signed)
Immediate Anesthesia Transfer of Care Note  Patient: Lindsay Mills  Procedure(s) Performed: LAPAROSCOPIC INSERTION GASTROSTOMY TUBE (N/A Abdomen)  Patient Location: [address]  Anesthesia Type:General  Level of Consciousness: awake, alert  and oriented  Airway & Oxygen Therapy: Patient remains intubated per anesthesia plan and Patient placed on Ventilator (see vital sign flow sheet for setting)  Post-op Assessment: Report given to RN and Post -op Vital signs reviewed and stable  Post vital signs: Reviewed and stable  Last Vitals:  Vitals Value Taken Time  BP    Temp    Pulse    Resp    SpO2      Last Pain: There were no vitals filed for this visit.       Complications: No apparent anesthesia complications

## 2017-10-24 NOTE — Anesthesia Preprocedure Evaluation (Signed)
Anesthesia Evaluation  Patient identified by MRN, date of birth, ID band Patient awake    Reviewed: Allergy & Precautions, NPO status , Patient's Chart, lab work & pertinent test results  Airway Mallampati: II  TM Distance: >3 FB Neck ROM: Limited    Dental no notable dental hx.    Pulmonary neg pulmonary ROS, former smoker,    Pulmonary exam normal breath sounds clear to auscultation       Cardiovascular negative cardio ROS Normal cardiovascular exam Rhythm:Regular Rate:Normal     Neuro/Psych Seizures -,  Incomplete quadriplegia at C5-6 level ([place]) 09/20/2017  CVA negative psych ROS   GI/Hepatic negative GI ROS, Neg liver ROS,   Endo/Other  Hypothyroidism   Renal/GU negative Renal ROS  negative genitourinary   Musculoskeletal negative musculoskeletal ROS (+)   Abdominal   Peds negative pediatric ROS (+)  Hematology negative hematology ROS (+)   Anesthesia Other Findings   Reproductive/Obstetrics negative OB ROS                             Anesthesia Physical Anesthesia Plan  ASA: IV  Anesthesia Plan: General   Post-op Pain Management:    Induction: Intravenous  PONV Risk Score and Plan: 2 and Ondansetron, Dexamethasone and Treatment may vary due to age or medical condition  Airway Management Planned: Oral ETT  Additional Equipment:   Intra-op Plan:   Post-operative Plan: Possible Post-op intubation/ventilation  Informed Consent: I have reviewed the patients History and Physical, chart, labs and discussed the procedure including the risks, benefits and alternatives for the proposed anesthesia with the patient or authorized representative who has indicated his/her understanding and acceptance.   Dental advisory given  Plan Discussed with: CRNA and Surgeon  Anesthesia Plan Comments:         Anesthesia Quick Evaluation

## 2017-10-24 NOTE — Op Note (Signed)
10/24/2017  11:08 AM  PATIENT:  Lindsay Mills  72 y.o. female  PRE-OPERATIVE DIAGNOSIS:  DYSPHAGIA; incomplete quadriplegia  POST-OPERATIVE DIAGNOSIS:  DYSPHAGIA; incomplete quadriplegia  PROCEDURE:  Procedure(s): LAPAROSCOPIC INSERTION 10 FRENCH GASTROSTOMY TUBE  SURGEON:  Surgeon(s): Greer Pickerel, MD  ASSISTANTS: Saverio Danker, PA-C  ANESTHESIA:   general  DRAINS: Gastrostomy Tube   LOCAL MEDICATIONS USED:  MARCAINE     SPECIMEN:  No Specimen  DISPOSITION OF SPECIMEN:  N/A  COUNTS:  YES  INDICATION FOR PROCEDURE: This is an unfortunate 72 year old female who had a ground-level fall and cervical spine fracture resulting in incomplete quadriplegia.  She underwent surgical fixation at outside hospital and ultimately underwent tracheostomy for ventilator dependence.  [facility].  She has been tolerating enteral feeds through a nasogastric tube and we were asked to place a gastrostomy tube since there was not a window for IR or gastroenterology to place a percutaneous gastrotomy tube.  I had a long discussion with her and her husband in the holding area.  Please see that note regarding risk and benefits as well as long-term implications of a G-tube.  PROCEDURE: After obtaining informed consent the patient was taken to operating room 1 and placed supine on the operating table.  Full general anesthesia was established.  Her abdomen was prepped and draped in the usual standard surgical fashion.  Sequential compression devices had been placed.  Surgical timeout was performed.  She received Ancef prior to skin incision.  A small supraumbilical incision was made.  Fascia was grasped and incised with a #11 blade.  The abdominal cavity was entered.  A pursestring suture was placed around the fascial edges with a 0 Vicryl.  A 12 mm Hassan trocar was placed and pneumoperitoneum was smoothly established up to a patient  pressure of 15 mmHg without changes and patient vital signs.  The laparoscope was inserted and the abdominal cavity was surveilled.  The patient had a very large left hepatic lobe.  It extended all the way down to the level of her umbilicus.  I placed an additional 5 mm trocar in the right midabdomen under direct visualization.  I used this to lift up the left lobe of the liver to inspect the stomach.  The patient had a eventration of her left diaphragm essentially resulting in a hiatal hernia per se.  Her stomach was very long and tubular.  However I felt there was enough intra-abdominal portion of her stomach in order to reach an appropriate position in the left upper quadrant for placement of a G-tube.  I placed an additional 5 mm trocar in the right lateral abdominal wall under direct visualization.  Given the size of the left hepatic lobe I felt I needed a liver retractor to lift up the left lobe of the liver to expose the stomach.  A Nathanson liver retractor was placed in the subxiphoid position to lift up the left lobe of the liver.  I identified the stomach confirming the location of the pylorus.  It appeared that the best location for the G-tube given her anatomy was going to be in the proximal antrum along the greater curvature.  I placed 3 interrupted 3-0 Prolene sutures in a triangular fashion in the proximal antrum of the stomach.  The tails of each of these sutures were then brought up through the abdominal wall as a trans-fascial suture and secured with a hemostat.  We then brought the sutures  under tension to lift up the stomach to the anterior abdominal wall.  We then obtained a [employer] Gastrostomy Set along with a Lacinda Axon Entuit 18Fr balloon gastrostomy tube.  I infiltrated local in the abdominal wall.  Then using the introducer needle I placed the needle through the skin approximately 2 fingerbreadths below the left subcostal margin into the stomach under direct laparoscopic  visualization.  I then threaded the guidewire through the needle into the gastric cavity.  The needle was then removed.  I then enlarged the skin incision with a knife.  I then serially dilated up the track through the skin soft tissue into the stomach using the Seldinger technique up to an 18 [place] dilator.  We tested the G-tube balloon to make sure it was intact before placing it.  I then introduced the 18 French sheath and dilator over the guidewire into the stomach under direct laparoscopic visualization.  The dilator was removed just leaving the sheath.  The 48 French G-tube was then inserted through the sheath after being lubricated into the stomach.  The sheath was then peeled away.  We then inflated the gastric balloon with 7 cc of water.  The balloon inflated and we confirmed this under direct visualization.  At this point we then tied down the 3 previously placed trans-fascial sutures that were going through the stomach serving as anchoring sutures to the abdominal wall.  The [facility] liver retractor was removed under direct visualization with out any injury to the liver.  I then remove the S sign trocar tying down the previously placed pursestring suture.  There is no air leak or palpable defect.  The two 5 mm trochars were removed and pneumoperitoneum was released.  Skin incisions were closed with a 4-0 Monocryl.  Dermabond was placed on all skin incisions including the 3 small nicks where the transvaginal sutures had been tied down underneath the skin surface.  We then placed to 3-0 nylon sutures through the skin through the bolster on the G-tube.  The G-tube had been secured at the 2 cm mark.  All needle, instrument sponge counts were correct x2.  There were no immediate complications.  [facility]  [facility] OF CARE[facility]  [facility]   Delay start of Pharmacological VTE agent (>24hrs) due to  surgical blood loss or risk of bleeding:  no  Leighton Ruff. Redmond Pulling, MD, FACS General, Bariatric, & Minimally Invasive Surgery [facility], [place]

## 2017-10-25 ENCOUNTER — Encounter (HOSPITAL_COMMUNITY): Payer: Self-pay | Admitting: General Surgery

## 2017-10-25 DIAGNOSIS — I480 Paroxysmal atrial fibrillation: Secondary | ICD-10-CM | POA: Diagnosis not present

## 2017-10-25 DIAGNOSIS — G8254 Quadriplegia, C5-C7 incomplete: Secondary | ICD-10-CM | POA: Diagnosis not present

## 2017-10-25 DIAGNOSIS — J986 Disorders of diaphragm: Secondary | ICD-10-CM | POA: Diagnosis not present

## 2017-10-25 NOTE — Progress Notes (Signed)
Pulmonary [place]   PULMONARY SERVICE  PROGRESS NOTE  Date of Service: 10/25/2017  LUNETTE TAPP  [id number]  DOB: 16-Apr-1945   DOA: 09/18/2017  Referring Physician: Merton Border, MD  HPI: Lindsay Mills is a 72 y.o. female seen for follow up of Acute on Chronic Respiratory Failure.  Patient is doing fairly well has been on full vent support.  Patient had a PEG tube placed currently is on 35% oxygen.  Medications: Reviewed on Rounds  Physical Exam:  Vitals:   Temperature 97.8 degrees pulse 108 respiratory rate 18 blood pressure 92/57 saturations 100%  Ventilator Settings  Mode of ventilation pressure assist-control FiO2 35% tidal volume 404 peep 5  . General: Comfortable at this time . Eyes: Grossly normal lids, irises & conjunctiva . ENT: grossly tongue is normal . Neck: no obvious mass . Cardiovascular: S1 S2 normal no gallop . Respiratory:  Good aeration scattered few rhonchi . Abdomen: soft . Skin: no rash seen on limited exam . Musculoskeletal: not rigid . Psychiatric:unable to assess . Neurologic: no seizure no involuntary movements         Lab Data:   Basic Metabolic Panel: Recent Labs  Lab 10/19/17 0356 10/22/17 0722 10/24/17 0528  NA 138 139 139  K 4.0 3.9 3.5  CL 97* 98 98  CO2 35* 31 32  GLUCOSE 121* 134* 95  BUN 25* 23 18  CREATININE 0.34* 0.32* 0.37*  CALCIUM 8.3* 8.2* 8.2*  MG  --  1.8 1.9  PHOS  --  2.2* 2.9    Liver Function Tests: No results for input(s): AST, ALT, ALKPHOS, BILITOT, PROT, ALBUMIN in the last 168 hours. No results for input(s): LIPASE, AMYLASE in the last 168 hours. No results for input(s): AMMONIA in the last 168 hours.  CBC: Recent Labs  Lab 10/22/17 0722  WBC 10.9*  HGB 8.2*  HCT 27.8*  MCV 89.1  PLT 181    Cardiac Enzymes: No results for input(s): CKTOTAL, CKMB, CKMBINDEX, TROPONINI in the last 168 hours.  BNP (last 3 results) No results for  input(s): BNP in the last 8760 hours.  ProBNP (last 3 results) No results for input(s): PROBNP in the last 8760 hours.  Radiological Exams: No results found.  Assessment/Plan Principal Problem:   Acute on chronic respiratory failure with hypoxia (HCC) Active Problems:   Disorders of diaphragm   Atrial fibrillation (HCC)   Incomplete quadriplegia at C5-6 level (HCC)   History of stroke   1.  acute on chronic Respiratory failure with hypoxia patient continues fully vent dependent not able to wean RSBI and mechanics have been poor 2. Type from attic paralysis limiting factor for weaning blood continue supportive care 3. Chronic atrial fibrillation rate is controlled 4. Incomplete quadriplegia C5-6 level supportive care a start of therapy as tolerated 5. History of stroke at baseline   I have personally seen and evaluated the patient, evaluated laboratory and imaging results, formulated the assessment and plan and placed orders. The Patient requires high complexity decision making for assessment and support.  Case was discussed on Rounds with the Respiratory Therapy Staff  Allyne Gee, MD [facility] Pulmonary Critical Care Medicine Sleep Medicine

## 2017-10-26 NOTE — Progress Notes (Signed)
Pulmonary [place]   PULMONARY SERVICE  PROGRESS NOTE  Date of Service: 10/26/2017  Lindsay Mills  [id number]  DOB: April 02, 1946   DOA: 09/18/2017  Referring Physician: Merton Border, MD  HPI: Lindsay Mills is a 72 y.o. female seen for follow up of Acute on Chronic Respiratory Failure.  Patient is on full vent support currently on pressure assist control mode patient has been on 35% FiO2  Medications: Reviewed on Rounds  Physical Exam:  Vitals: Temperature 97.8 pulse 91 respiratory rate 20 blood pressure 125/73 saturations 99%  Ventilator Settings mode of ventilation pressure assist control FiO2 35% tidal volumes about 350 PEEP 5  . General: Comfortable at this time . Eyes: Grossly normal lids, irises & conjunctiva . ENT: grossly tongue is normal . Neck: no obvious mass . Cardiovascular: S1 S2 normal no gallop . Respiratory: No rhonchi or rales are noted at this time . Abdomen: soft . Skin: no rash seen on limited exam . Musculoskeletal: not rigid . Psychiatric:unable to assess . Neurologic: no seizure no involuntary movements         Lab Data:   Basic Metabolic Panel: Recent Labs  Lab 10/22/17 0722 10/24/17 0528  NA 139 139  K 3.9 3.5  CL 98 98  CO2 31 32  GLUCOSE 134* 95  BUN 23 18  CREATININE 0.32* 0.37*  CALCIUM 8.2* 8.2*  MG 1.8 1.9  PHOS 2.2* 2.9    Liver Function Tests: No results for input(s): AST, ALT, ALKPHOS, BILITOT, PROT, ALBUMIN in the last 168 hours. No results for input(s): LIPASE, AMYLASE in the last 168 hours. No results for input(s): AMMONIA in the last 168 hours.  CBC: Recent Labs  Lab 10/22/17 0722  WBC 10.9*  HGB 8.2*  HCT 27.8*  MCV 89.1  PLT 181    Cardiac Enzymes: No results for input(s): CKTOTAL, CKMB, CKMBINDEX, TROPONINI in the last 168 hours.  BNP (last 3 results) No results for input(s): BNP in the last 8760 hours.  ProBNP (last 3 results) No results for  input(s): PROBNP in the last 8760 hours.  Radiological Exams: No results found.  Assessment/Plan Principal Problem:   Acute on chronic respiratory failure with hypoxia (HCC) Active Problems:   Disorders of diaphragm   Atrial fibrillation (HCC)   Incomplete quadriplegia at C5-6 level (HCC)   History of stroke   1. Acute on chronic respiratory failure with hypoxia we will continue with secretion management pulmonary toilet she is not wean able 2. Diaphragmatic paralysis continue supportive care on the ventilator. 3. Chronic atrial fibrillation rate is controlled 4. Incomplete quadriplegia restorative therapy 5. History of stroke at baseline   I have personally seen and evaluated the patient, evaluated laboratory and imaging results, formulated the assessment and plan and placed orders. The Patient requires high complexity decision making for assessment and support.  Case was discussed on Rounds with the Respiratory Therapy Staff  Allyne Gee, MD [facility] Pulmonary Critical Care Medicine Sleep Medicine

## 2017-10-27 ENCOUNTER — Other Ambulatory Visit (HOSPITAL_COMMUNITY): Payer: Self-pay

## 2017-10-27 DIAGNOSIS — R140 Abdominal distension (gaseous): Secondary | ICD-10-CM | POA: Diagnosis not present

## 2017-10-27 NOTE — Progress Notes (Signed)
Pulmonary Critical Care Medicine [facility] [place]   PULMONARY SERVICE  PROGRESS NOTE  Date of Service: 10/27/2017  Lindsay Mills  [id number]  DOB: September 10, 1945   DOA: 09/18/2017  Referring Physician: Carron Curie, MD  HPI: Lindsay Mills is a 72 y.o. female seen for follow up of Acute on Chronic Respiratory Failure.  She remains on the ventilator not weaning this morning reportedly refused to go on a wean.  Comfortable without distress  Medications: Reviewed on Rounds  Physical Exam:  Vitals: Temperature 97.4 pulse 83 respiratory 18 blood pressure 117/65 saturations 99%  Ventilator Settings mode of ventilation assist control FiO2 28% tidal volume 355 PEEP 5  . General: Comfortable at this time . Eyes: Grossly normal lids, irises & conjunctiva . ENT: grossly tongue is normal . Neck: no obvious mass . Cardiovascular: S1 S2 normal no gallop . Respiratory: No rhonchi or rales . Abdomen: soft . Skin: no rash seen on limited exam . Musculoskeletal: not rigid . Psychiatric:unable to assess . Neurologic: no seizure no involuntary movements         Lab Data:   Basic Metabolic Panel: Recent Labs  Lab 10/22/17 0722 10/24/17 0528  NA 139 139  K 3.9 3.5  CL 98 98  CO2 31 32  GLUCOSE 134* 95  BUN 23 18  CREATININE 0.32* 0.37*  CALCIUM 8.2* 8.2*  MG 1.8 1.9  PHOS 2.2* 2.9    Liver Function Tests: No results for input(s): AST, ALT, ALKPHOS, BILITOT, PROT, ALBUMIN in the last 168 hours. No results for input(s): LIPASE, AMYLASE in the last 168 hours. No results for input(s): AMMONIA in the last 168 hours.  CBC: Recent Labs  Lab 10/22/17 0722  WBC 10.9*  HGB 8.2*  HCT 27.8*  MCV 89.1  PLT 181    Cardiac Enzymes: No results for input(s): CKTOTAL, CKMB, CKMBINDEX, TROPONINI in the last 168 hours.  BNP (last 3 results) No results for input(s): BNP in the last 8760 hours.  ProBNP (last 3 results) No results for input(s): PROBNP in the last  8760 hours.  Radiological Exams: Dg Abd Portable 1v  Result Date: 10/27/2017 CLINICAL DATA:  Abdominal pain and distention. EXAM: PORTABLE ABDOMEN - 1 VIEW COMPARISON:  10/12/2017 FINDINGS: Examination demonstrates a nonobstructive bowel gas pattern. There is stable elevation of the left hemidiaphragm. Stabilization hardware over the thoracolumbar sacral spine unchanged. Degenerative changes of the hips. Several pelvic phleboliths unchanged. IMPRESSION: Nonobstructive bowel gas pattern. Electronically Signed   By: Elberta Fortis M.D.   On: 10/27/2017 16:18    Assessment/Plan Principal Problem:   Acute on chronic respiratory failure with hypoxia (HCC) Active Problems:   Disorders of diaphragm   Atrial fibrillation (HCC)   Incomplete quadriplegia at C5-6 level (HCC)   History of stroke   1. Acute on chronic respiratory failure with hypoxia we will continue with full support continue to check the spontaneous breathing index.  For all intents and purposes patient is basically not 2. Diaphragmatic paralysis stable continue to monitor 3. Chronic atrial fibrillation rate is controlled 4. Incomplete quadriplegia able to move upper extremities very weakly but not able to move any lower extremity. 5. History of stroke stable   I have personally seen and evaluated the patient, evaluated laboratory and imaging results, formulated the assessment and plan and placed orders. The Patient requires high complexity decision making for assessment and support.  Case was discussed on Rounds with the Respiratory Therapy Staff  Yevonne Pax, MD [facility] Pulmonary  Critical Care Medicine Sleep Medicine

## 2017-10-28 LAB — CBC WITH DIFFERENTIAL/PLATELET
Abs Immature Granulocytes: 0.1 10*3/uL (ref 0.0–0.1)
BASOS PCT: 0 %
Basophils Absolute: 0 10*3/uL (ref 0.0–0.1)
EOS ABS: 0.2 10*3/uL (ref 0.0–0.7)
EOS PCT: 2 %
HEMATOCRIT: 33.1 % — AB (ref 36.0–46.0)
Hemoglobin: 9.9 g/dL — ABNORMAL LOW (ref 12.0–15.0)
IMMATURE GRANULOCYTES: 1 %
LYMPHS ABS: 0.6 10*3/uL — AB (ref 0.7–4.0)
Lymphocytes Relative: 5 %
MCH: 25.9 pg — ABNORMAL LOW (ref 26.0–34.0)
MCHC: 29.9 g/dL — AB (ref 30.0–36.0)
MCV: 86.6 fL (ref 78.0–100.0)
Monocytes Absolute: 0.8 10*3/uL (ref 0.1–1.0)
Monocytes Relative: 6 %
NEUTROS PCT: 86 %
Neutro Abs: 11 10*3/uL — ABNORMAL HIGH (ref 1.7–7.7)
PLATELETS: 218 10*3/uL (ref 150–400)
RBC: 3.82 MIL/uL — AB (ref 3.87–5.11)
RDW: 17.6 % — AB (ref 11.5–15.5)
WBC: 12.8 10*3/uL — AB (ref 4.0–10.5)

## 2017-10-28 LAB — BASIC METABOLIC PANEL
Anion gap: 9 (ref 5–15)
BUN: 11 mg/dL (ref 8–23)
CALCIUM: 7.6 mg/dL — AB (ref 8.9–10.3)
CO2: 30 mmol/L (ref 22–32)
Chloride: 99 mmol/L (ref 98–111)
Glucose, Bld: 106 mg/dL — ABNORMAL HIGH (ref 70–99)
Potassium: 4 mmol/L (ref 3.5–5.1)
Sodium: 138 mmol/L (ref 135–145)

## 2017-10-28 LAB — MAGNESIUM: Magnesium: 1.9 mg/dL (ref 1.7–2.4)

## 2017-10-28 NOTE — Progress Notes (Signed)
Pulmonary Critical Care Medicine [facility] [place]   PULMONARY SERVICE  PROGRESS NOTE  Date of Service: 10/28/2017  WHITNIE MOOTS  [id number]  DOB: May 11, 1945   DOA: 09/18/2017  Referring Physician: Carron Curie, MD  HPI: VERLE PATTAN is a 72 y.o. female seen for follow up of Acute on Chronic Respiratory Failure.  She did not tolerate more than a couple of minutes of pressure support reportedly.  Right now is back on the ventilator full support  Medications: Reviewed on Rounds  Physical Exam:  Vitals: Temperature 98.6 pulse 107 respiratory 21 blood pressure 138/78 saturations 98%  Ventilator Settings mode of ventilation assist control FiO2 28% tidal volume 450 PEEP 5  . General: Comfortable at this time . Eyes: Grossly normal lids, irises & conjunctiva . ENT: grossly tongue is normal . Neck: no obvious mass . Cardiovascular: S1 S2 normal no gallop . Respiratory: No rhonchi rales . Abdomen: soft . Skin: no rash seen on limited exam . Musculoskeletal: not rigid . Psychiatric:unable to assess . Neurologic: no seizure no involuntary movements         Lab Data:   Basic Metabolic Panel: Recent Labs  Lab 10/22/17 0722 10/24/17 0528 10/28/17 0739  NA 139 139 138  K 3.9 3.5 4.0  CL 98 98 99  CO2 31 32 30  GLUCOSE 134* 95 106*  BUN 23 18 11   CREATININE 0.32* 0.37* <0.30*  CALCIUM 8.2* 8.2* 7.6*  MG 1.8 1.9 1.9  PHOS 2.2* 2.9  --     Liver Function Tests: No results for input(s): AST, ALT, ALKPHOS, BILITOT, PROT, ALBUMIN in the last 168 hours. No results for input(s): LIPASE, AMYLASE in the last 168 hours. No results for input(s): AMMONIA in the last 168 hours.  CBC: Recent Labs  Lab 10/22/17 0722 10/28/17 0739  WBC 10.9* 12.8*  NEUTROABS  --  11.0*  HGB 8.2* 9.9*  HCT 27.8* 33.1*  MCV 89.1 86.6  PLT 181 218    Cardiac Enzymes: No results for input(s): CKTOTAL, CKMB, CKMBINDEX, TROPONINI in the last 168 hours.  BNP (last 3  results) No results for input(s): BNP in the last 8760 hours.  ProBNP (last 3 results) No results for input(s): PROBNP in the last 8760 hours.  Radiological Exams: Dg Abd Portable 1v  Result Date: 10/27/2017 CLINICAL DATA:  Abdominal pain and distention. EXAM: PORTABLE ABDOMEN - 1 VIEW COMPARISON:  10/12/2017 FINDINGS: Examination demonstrates a nonobstructive bowel gas pattern. There is stable elevation of the left hemidiaphragm. Stabilization hardware over the thoracolumbar sacral spine unchanged. Degenerative changes of the hips. Several pelvic phleboliths unchanged. IMPRESSION: Nonobstructive bowel gas pattern. Electronically Signed   By: Elberta Fortis M.D.   On: 10/27/2017 16:18    Assessment/Plan Principal Problem:   Acute on chronic respiratory failure with hypoxia (HCC) Active Problems:   Disorders of diaphragm   Atrial fibrillation (HCC)   Incomplete quadriplegia at C5-6 level (HCC)   History of stroke   1. Acute on chronic respiratory failure with hypoxia patient is comfortable now on assist control she is been refusing weaning.  We will continue to try to encourage compliance 2. Diaphragmatic paralysis stable no improvement 3. Chronic atrial fibrillation rate is controlled 4. Incomplete quadriplegia C5-6 able to move upper extremities slightly against gravity no movement in the lower extremities 5. History of stroke stable at this time   I have personally seen and evaluated the patient, evaluated laboratory and imaging results, formulated the assessment and plan and placed  orders. The Patient requires high complexity decision making for assessment and support.  Case was discussed on Rounds with the Respiratory Therapy Staff  Yevonne Pax, MD [facility] Pulmonary Critical Care Medicine Sleep Medicine

## 2017-10-29 NOTE — Progress Notes (Signed)
Pulmonary [place]   PULMONARY SERVICE  PROGRESS NOTE  Date of Service: 10/29/2017  Lindsay Mills  [id number]  DOB: 1945/07/29   DOA: 09/18/2017  Referring Physician: Merton Border, MD  HPI: Lindsay Mills is a 72 y.o. female seen for follow up of Acute on Chronic Respiratory Failure.  He remains on the ventilator basically at this point has been refusing to wean  Medications: Reviewed on Rounds  Physical Exam:  Vitals: Temperature 99.8 pulse 100 respiratory rate 20 blood pressure 125/65 saturations 100%  Ventilator Settings on assist control FiO2 20% tidal volume 450 PEEP 5  . General: Comfortable at this time . Eyes: Grossly normal lids, irises & conjunctiva . ENT: grossly tongue is normal . Neck: no obvious mass . Cardiovascular: S1 S2 normal no gallop . Respiratory: No rhonchi or rales . Abdomen: soft . Skin: no rash seen on limited exam . Musculoskeletal: not rigid . Psychiatric:unable to assess . Neurologic: no seizure no involuntary movements         Lab Data:   Basic Metabolic Panel: Recent Labs  Lab 10/24/17 0528 10/28/17 0739  NA 139 138  K 3.5 4.0  CL 98 99  CO2 32 30  GLUCOSE 95 106*  BUN 18 11  CREATININE 0.37* <0.30*  CALCIUM 8.2* 7.6*  MG 1.9 1.9  PHOS 2.9  --     Liver Function Tests: No results for input(s): AST, ALT, ALKPHOS, BILITOT, PROT, ALBUMIN in the last 168 hours. No results for input(s): LIPASE, AMYLASE in the last 168 hours. No results for input(s): AMMONIA in the last 168 hours.  CBC: Recent Labs  Lab 10/28/17 0739  WBC 12.8*  NEUTROABS 11.0*  HGB 9.9*  HCT 33.1*  MCV 86.6  PLT 218    Cardiac Enzymes: No results for input(s): CKTOTAL, CKMB, CKMBINDEX, TROPONINI in the last 168 hours.  BNP (last 3 results) No results for input(s): BNP in the last 8760 hours.  ProBNP (last 3 results) No results for input(s): PROBNP in the last 8760 hours.  Radiological  Exams: Dg Abd Portable 1v  Result Date: 10/27/2017 CLINICAL DATA:  Abdominal pain and distention. EXAM: PORTABLE ABDOMEN - 1 VIEW COMPARISON:  10/12/2017 FINDINGS: Examination demonstrates a nonobstructive bowel gas pattern. There is stable elevation of the left hemidiaphragm. Stabilization hardware over the thoracolumbar sacral spine unchanged. Degenerative changes of the hips. Several pelvic phleboliths unchanged. IMPRESSION: Nonobstructive bowel gas pattern. Electronically Signed   By: Marin Olp M.D.   On: 10/27/2017 16:18    Assessment/Plan Principal Problem:   Acute on chronic respiratory failure with hypoxia (HCC) Active Problems:   Disorders of diaphragm   Atrial fibrillation (HCC)   Incomplete quadriplegia at C5-6 level (HCC)   History of stroke   1. Acute on chronic respiratory failure with hypoxia continue with supportive care try to get her to wean but she has been noncompliant. 2. Diaphragmatic paralysis unchanged 3. Chronic atrial fibrillation rate control 4. Incomplete quadriplegia we will continue with supportive therapy restorative therapy 5. History of stroke unchanged   I have personally seen and evaluated the patient, evaluated laboratory and imaging results, formulated the assessment and plan and placed orders. The Patient requires high complexity decision making for assessment and support.  Case was discussed on Rounds with the Respiratory Therapy Staff  Allyne Gee, MD [facility] Pulmonary Critical Care Medicine Sleep Medicine

## 2017-10-30 ENCOUNTER — Other Ambulatory Visit (HOSPITAL_COMMUNITY): Payer: Self-pay

## 2017-10-30 DIAGNOSIS — N281 Cyst of kidney, acquired: Secondary | ICD-10-CM | POA: Diagnosis not present

## 2017-10-30 LAB — PHOSPHORUS: PHOSPHORUS: 1.6 mg/dL — AB (ref 2.5–4.6)

## 2017-10-30 LAB — BASIC METABOLIC PANEL
Anion gap: 8 (ref 5–15)
BUN: 9 mg/dL (ref 8–23)
CHLORIDE: 101 mmol/L (ref 98–111)
CO2: 29 mmol/L (ref 22–32)
Calcium: 7.7 mg/dL — ABNORMAL LOW (ref 8.9–10.3)
Glucose, Bld: 99 mg/dL (ref 70–99)
Potassium: 3.8 mmol/L (ref 3.5–5.1)
Sodium: 138 mmol/L (ref 135–145)

## 2017-10-30 LAB — MAGNESIUM: Magnesium: 1.8 mg/dL (ref 1.7–2.4)

## 2017-10-30 LAB — CBC
HCT: 29.1 % — ABNORMAL LOW (ref 36.0–46.0)
HEMOGLOBIN: 8.7 g/dL — AB (ref 12.0–15.0)
MCH: 25.4 pg — AB (ref 26.0–34.0)
MCHC: 29.9 g/dL — AB (ref 30.0–36.0)
MCV: 84.8 fL (ref 78.0–100.0)
Platelets: 251 10*3/uL (ref 150–400)
RBC: 3.43 MIL/uL — ABNORMAL LOW (ref 3.87–5.11)
RDW: 17.8 % — ABNORMAL HIGH (ref 11.5–15.5)
WBC: 10.4 10*3/uL (ref 4.0–10.5)

## 2017-10-30 NOTE — Progress Notes (Signed)
Pulmonary [place]   PULMONARY SERVICE  PROGRESS NOTE  Date of Service: 10/30/2017  Lindsay Mills  [id number]  DOB: 02-21-1946   DOA: 09/18/2017  Referring Physician: Merton Border, MD  HPI: Lindsay Mills is a 72 y.o. female seen for follow up of Acute on Chronic Respiratory Failure.  Patient is comfortable without distress remains on assist control at this time.  She is not able to wean  Medications: Reviewed on Rounds  Physical Exam:  Vitals: Temperature 97.2 pulse 94 respiratory 18 blood pressure 94/51 saturations 98%  Ventilator Settings mode of ventilation assist control FiO2 20% tidal volume 4 4 PEEP 5  . General: Comfortable at this time . Eyes: Grossly normal lids, irises & conjunctiva . ENT: grossly tongue is normal . Neck: no obvious mass . Cardiovascular: S1 S2 normal no gallop . Respiratory: No rhonchi no rales are noted . Abdomen: soft . Skin: no rash seen on limited exam . Musculoskeletal: not rigid . Psychiatric:unable to assess . Neurologic: no seizure no involuntary movements         Lab Data:   Basic Metabolic Panel: Recent Labs  Lab 10/24/17 0528 10/28/17 0739  NA 139 138  K 3.5 4.0  CL 98 99  CO2 32 30  GLUCOSE 95 106*  BUN 18 11  CREATININE 0.37* <0.30*  CALCIUM 8.2* 7.6*  MG 1.9 1.9  PHOS 2.9  --     Liver Function Tests: No results for input(s): AST, ALT, ALKPHOS, BILITOT, PROT, ALBUMIN in the last 168 hours. No results for input(s): LIPASE, AMYLASE in the last 168 hours. No results for input(s): AMMONIA in the last 168 hours.  CBC: Recent Labs  Lab 10/28/17 0739 10/30/17 0734  WBC 12.8* 10.4  NEUTROABS 11.0*  --   HGB 9.9* 8.7*  HCT 33.1* 29.1*  MCV 86.6 84.8  PLT 218 251    Cardiac Enzymes: No results for input(s): CKTOTAL, CKMB, CKMBINDEX, TROPONINI in the last 168 hours.  BNP (last 3 results) No results for input(s): BNP in the last 8760 hours.  ProBNP  (last 3 results) No results for input(s): PROBNP in the last 8760 hours.  Radiological Exams: No results found.  Assessment/Plan Principal Problem:   Acute on chronic respiratory failure with hypoxia (HCC) Active Problems:   Disorders of diaphragm   Atrial fibrillation (HCC)   Incomplete quadriplegia at C5-6 level (HCC)   History of stroke   1. Acute on chronic respiratory failure with hypoxia we will continue with full vent support.  Was attempted on weaning did not tolerate. 2. Diaphragmatic paralysis we will continue with supportive care prognosis guarded 3. Chronic atrial fibrillation rate is controlled 4. Incomplete quadriplegia C5-6 stable restorative therapy 5. History of stroke stable unchanged   I have personally seen and evaluated the patient, evaluated laboratory and imaging results, formulated the assessment and plan and placed orders. The Patient requires high complexity decision making for assessment and support.  Case was discussed on Rounds with the Respiratory Therapy Staff  Allyne Gee, MD [facility] Pulmonary Critical Care Medicine Sleep Medicine

## 2017-10-31 LAB — CBC
HCT: 28.3 % — ABNORMAL LOW (ref 36.0–46.0)
Hemoglobin: 8.7 g/dL — ABNORMAL LOW (ref 12.0–15.0)
MCH: 25.4 pg — ABNORMAL LOW (ref 26.0–34.0)
MCHC: 30.7 g/dL (ref 30.0–36.0)
MCV: 82.7 fL (ref 78.0–100.0)
PLATELETS: 251 10*3/uL (ref 150–400)
RBC: 3.42 MIL/uL — AB (ref 3.87–5.11)
RDW: 17.7 % — ABNORMAL HIGH (ref 11.5–15.5)
WBC: 9.9 10*3/uL (ref 4.0–10.5)

## 2017-10-31 LAB — BASIC METABOLIC PANEL
ANION GAP: 8 (ref 5–15)
BUN: 9 mg/dL (ref 8–23)
CO2: 27 mmol/L (ref 22–32)
Calcium: 7.4 mg/dL — ABNORMAL LOW (ref 8.9–10.3)
Chloride: 103 mmol/L (ref 98–111)
Creatinine, Ser: 0.32 mg/dL — ABNORMAL LOW (ref 0.44–1.00)
GLUCOSE: 83 mg/dL (ref 70–99)
POTASSIUM: 3.4 mmol/L — AB (ref 3.5–5.1)
SODIUM: 138 mmol/L (ref 135–145)

## 2017-10-31 LAB — MAGNESIUM: MAGNESIUM: 1.8 mg/dL (ref 1.7–2.4)

## 2017-10-31 LAB — PHOSPHORUS: PHOSPHORUS: 2.2 mg/dL — AB (ref 2.5–4.6)

## 2017-10-31 NOTE — Progress Notes (Signed)
Pulmonary Critical Care Medicine [facility] [place]   PULMONARY SERVICE  PROGRESS NOTE  Date of Service: 10/31/2017  Lindsay Mills  [id number]  DOB: Sep 07, 1945   DOA: 09/18/2017  Referring Physician: Carron Curie, MD  HPI: Lindsay Mills is a 72 y.o. female seen for follow up of Acute on Chronic Respiratory Failure.  Patient is currently on assist control mode has been not doing much in the way of weaning remains on 28% FiO2  Medications: Reviewed on Rounds  Physical Exam:  Vitals: Temperature 97.4 pulse 99 respiratory rate 18 blood pressure 115/66 saturations 100%  Ventilator Settings mode of ventilation assist control FiO2 28% tidal volume 450 PEEP 5  . General: Comfortable at this time . Eyes: Grossly normal lids, irises & conjunctiva . ENT: grossly tongue is normal . Neck: no obvious mass . Cardiovascular: S1 S2 normal no gallop . Respiratory: Scattered rhonchi no rales . Abdomen: soft . Skin: no rash seen on limited exam . Musculoskeletal: not rigid . Psychiatric:unable to assess . Neurologic: no seizure no involuntary movements         Lab Data:   Basic Metabolic Panel: Recent Labs  Lab 10/28/17 0739 10/30/17 0734 10/31/17 0443  NA 138 138 138  K 4.0 3.8 3.4*  CL 99 101 103  CO2 30 29 27   GLUCOSE 106* 99 83  BUN 11 9 9   CREATININE <0.30* <0.30* 0.32*  CALCIUM 7.6* 7.7* 7.4*  MG 1.9 1.8 1.8  PHOS  --  1.6* 2.2*    Liver Function Tests: No results for input(s): AST, ALT, ALKPHOS, BILITOT, PROT, ALBUMIN in the last 168 hours. No results for input(s): LIPASE, AMYLASE in the last 168 hours. No results for input(s): AMMONIA in the last 168 hours.  CBC: Recent Labs  Lab 10/28/17 0739 10/30/17 0734 10/31/17 0443  WBC 12.8* 10.4 9.9  NEUTROABS 11.0*  --   --   HGB 9.9* 8.7* 8.7*  HCT 33.1* 29.1* 28.3*  MCV 86.6 84.8 82.7  PLT 218 251 251    Cardiac Enzymes: No results for input(s): CKTOTAL, CKMB, CKMBINDEX, TROPONINI in  the last 168 hours.  BNP (last 3 results) No results for input(s): BNP in the last 8760 hours.  ProBNP (last 3 results) No results for input(s): PROBNP in the last 8760 hours.  Radiological Exams: Ct Abdomen Pelvis Wo Contrast  Result Date: 10/30/2017 CLINICAL DATA:  72 year old female post laparoscopic insertion of gastrostomy tube 10/24/2017. Soft tissue swelling. Cellulitis. Osteomyelitis. Subsequent encounter. EXAM: CT ABDOMEN AND PELVIS WITHOUT CONTRAST TECHNIQUE: Multidetector CT imaging of the abdomen and pelvis was performed following the standard protocol without IV contrast. COMPARISON:  10/27/2017 plain film exam.  09/24/2017 CT. FINDINGS: Lower chest: Elevated left hemidiaphragm (superior extent not completely imaged). Basilar parenchymal changes greater on the right may reflect changes of aspiration. Hepatobiliary: Taking into account limitation by non contrast imaging, no worrisome hepatic lesion. Liver top-normal size. Calcified gallstones. Pancreas: Evaluation limited secondary to artifact, lack of contrast and fat planes. Spleen: Taking into account limitation by non contrast imaging, no splenic lesion or enlargement. Adrenals/Urinary Tract: Evaluation limited by streak artifact. No hydronephrosis. 4.8 cm right renal cyst with peripheral calcifications. No obvious adrenal or bladder lesion. Stomach/Bowel: Gastrostomy tube anterior aspect lower gastric body. Extending from the gastrostomy site into left abdominal/pelvic anterior and lateral wall is a gas and fluid containing collection suggesting leak/cellulitis. This collection spans from lower chest wall to the pelvic brim. Evaluation of bowel limited by lack of fat  planes and streak artifact. Vascular/Lymphatic: Atherosclerotic changes aorta without abdominal aortic aneurysm. Limited for evaluating for adenopathy. Reproductive: Possible uterine fibroid.  No obvious adnexal mass. Other: Third spacing of fluid.  Sacral decubitus.  Musculoskeletal: Extensive fusion lower thoracic an to sacrum with significant streak artifact. Scoliosis convex left. IMPRESSION: 1. Gastrostomy tube anterior aspect lower gastric body. Extending from the gastrostomy site into left abdominal/pelvic anterior and lateral wall is a gas and fluid containing collection suggesting leak/cellulitis. This collection spans from lower chest wall to the pelvic brim. 2. Elevated left hemidiaphragm. Basilar parenchymal changes greater on the right may be related to aspiration. 3. 4.8 cm right renal cyst with peripheral calcification. Stability can be confirmed on follow-up. This is inadequately assessed on present exam secondary to streak artifact and lack of contrast. 4. Scoliosis with extensive fusion lower thoracic spine to sacrum. 5. Third spacing of fluid. 6.  Aortic Atherosclerosis (ICD10-I70.0). 7. Sacral decubitus. 8. Small calcified gallstones. These results will be called to the ordering clinician or representative by the Radiologist Assistant, and communication documented in the PACS or zVision Dashboard. Electronically Signed   By: Lacy Duverney M.D.   On: 10/30/2017 10:30    Assessment/Plan Principal Problem:   Acute on chronic respiratory failure with hypoxia [facility]) Active Problems:   Disorders of diaphragm   Atrial fibrillation (HCC)   Incomplete quadriplegia at C5-6 level (HCC)   History of stroke   1. Acute on chronic respiratory failure with hypoxia patient continues on full vent support she refuses to be weaned and actually cannot tolerate weaning she is at baseline. 2. Diaphragmatic paralysis likely reason for failure to wean 3. Chronic atrial fibrillation we will continue supportive care rate is controlled 4. Incomplete quadriplegia restorative therapy 5. History of stroke stable   I have personally seen and evaluated the patient, evaluated laboratory and imaging results, formulated the assessment and plan and placed orders. The Patient  requires high complexity decision making for assessment and support.  Case was discussed on Rounds with the Respiratory Therapy Staff  Yevonne Pax, MD [facility] Pulmonary Critical Care Medicine Sleep Medicine

## 2017-11-01 ENCOUNTER — Other Ambulatory Visit (HOSPITAL_COMMUNITY): Payer: Self-pay

## 2017-11-01 DIAGNOSIS — Z4682 Encounter for fitting and adjustment of non-vascular catheter: Secondary | ICD-10-CM | POA: Diagnosis not present

## 2017-11-01 LAB — BASIC METABOLIC PANEL
Anion gap: 9 (ref 5–15)
BUN: 7 mg/dL — AB (ref 8–23)
CO2: 26 mmol/L (ref 22–32)
Calcium: 7.6 mg/dL — ABNORMAL LOW (ref 8.9–10.3)
Chloride: 104 mmol/L (ref 98–111)
Creatinine, Ser: <0.3 mg/dL — ABNORMAL LOW (ref 0.44–1.00)
Glucose, Bld: 100 mg/dL — ABNORMAL HIGH (ref 70–99)
POTASSIUM: 4 mmol/L (ref 3.5–5.1)
SODIUM: 139 mmol/L (ref 135–145)

## 2017-11-01 LAB — TRIGLYCERIDES: Triglycerides: 56 mg/dL (ref <150)

## 2017-11-01 LAB — MAGNESIUM: MAGNESIUM: 1.7 mg/dL (ref 1.7–2.4)

## 2017-11-01 LAB — PHOSPHORUS: PHOSPHORUS: 2.4 mg/dL — AB (ref 2.5–4.6)

## 2017-11-01 MED ORDER — IOPAMIDOL (ISOVUE-300) INJECTION 61%
50.0000 mL | Freq: Once | INTRAVENOUS | Status: AC | PRN
  Administered 2017-11-01: 50 mL

## 2017-11-01 NOTE — Progress Notes (Signed)
Pulmonary Critical Care Medicine [facility] [place]   PULMONARY SERVICE  PROGRESS NOTE  Date of Service: 11/01/2017  Lindsay Mills  [id number]  DOB: February 19, 1946   DOA: 09/18/2017  Referring Physician: Carron Curie, MD  HPI: Lindsay Mills is a 72 y.o. female seen for follow up of Acute on Chronic Respiratory Failure.  She remains on the ventilator not weaning.  Right now is on 28% FiO2 patient has been on pressure assist control mode.  Medications: Reviewed on Rounds  Physical Exam:  Vitals: Temperature 97.1 pulse 110 respiratory rate 20 blood pressure 130/58 saturations 99%  Ventilator Settings currently on pressure assist control FiO2 28% tidal volume 450 PEEP 5  . General: Comfortable at this time . Eyes: Grossly normal lids, irises & conjunctiva . ENT: grossly tongue is normal . Neck: no obvious mass . Cardiovascular: S1 S2 normal no gallop . Respiratory: Scattered rhonchi are noted overall breath sounds equal . Abdomen: soft . Skin: no rash seen on limited exam . Musculoskeletal: not rigid . Psychiatric:unable to assess . Neurologic: no seizure no involuntary movements         Lab Data:   Basic Metabolic Panel: Recent Labs  Lab 10/28/17 0739 10/30/17 0734 10/31/17 0443 11/01/17 0634  NA 138 138 138 139  K 4.0 3.8 3.4* 4.0  CL 99 101 103 104  CO2 30 29 27 26   GLUCOSE 106* 99 83 100*  BUN 11 9 9  7*  CREATININE <0.30* <0.30* 0.32* <0.30*  CALCIUM 7.6* 7.7* 7.4* 7.6*  MG 1.9 1.8 1.8 1.7  PHOS  --  1.6* 2.2* 2.4*    Liver Function Tests: No results for input(s): AST, ALT, ALKPHOS, BILITOT, PROT, ALBUMIN in the last 168 hours. No results for input(s): LIPASE, AMYLASE in the last 168 hours. No results for input(s): AMMONIA in the last 168 hours.  CBC: Recent Labs  Lab 10/28/17 0739 10/30/17 0734 10/31/17 0443  WBC 12.8* 10.4 9.9  NEUTROABS 11.0*  --   --   HGB 9.9* 8.7* 8.7*  HCT 33.1* 29.1* 28.3*  MCV 86.6 84.8 82.7  PLT  218 251 251    Cardiac Enzymes: No results for input(s): CKTOTAL, CKMB, CKMBINDEX, TROPONINI in the last 168 hours.  BNP (last 3 results) No results for input(s): BNP in the last 8760 hours.  ProBNP (last 3 results) No results for input(s): PROBNP in the last 8760 hours.  Radiological Exams: Ct Abdomen Pelvis Wo Contrast  Result Date: 10/30/2017 CLINICAL DATA:  72 year old female post laparoscopic insertion of gastrostomy tube 10/24/2017. Soft tissue swelling. Cellulitis. Osteomyelitis. Subsequent encounter. EXAM: CT ABDOMEN AND PELVIS WITHOUT CONTRAST TECHNIQUE: Multidetector CT imaging of the abdomen and pelvis was performed following the standard protocol without IV contrast. COMPARISON:  10/27/2017 plain film exam.  09/24/2017 CT. FINDINGS: Lower chest: Elevated left hemidiaphragm (superior extent not completely imaged). Basilar parenchymal changes greater on the right may reflect changes of aspiration. Hepatobiliary: Taking into account limitation by non contrast imaging, no worrisome hepatic lesion. Liver top-normal size. Calcified gallstones. Pancreas: Evaluation limited secondary to artifact, lack of contrast and fat planes. Spleen: Taking into account limitation by non contrast imaging, no splenic lesion or enlargement. Adrenals/Urinary Tract: Evaluation limited by streak artifact. No hydronephrosis. 4.8 cm right renal cyst with peripheral calcifications. No obvious adrenal or bladder lesion. Stomach/Bowel: Gastrostomy tube anterior aspect lower gastric body. Extending from the gastrostomy site into left abdominal/pelvic anterior and lateral wall is a gas and fluid containing collection suggesting leak/cellulitis. This collection  spans from lower chest wall to the pelvic brim. Evaluation of bowel limited by lack of fat planes and streak artifact. Vascular/Lymphatic: Atherosclerotic changes aorta without abdominal aortic aneurysm. Limited for evaluating for adenopathy. Reproductive: Possible  uterine fibroid.  No obvious adnexal mass. Other: Third spacing of fluid.  Sacral decubitus. Musculoskeletal: Extensive fusion lower thoracic an to sacrum with significant streak artifact. Scoliosis convex left. IMPRESSION: 1. Gastrostomy tube anterior aspect lower gastric body. Extending from the gastrostomy site into left abdominal/pelvic anterior and lateral wall is a gas and fluid containing collection suggesting leak/cellulitis. This collection spans from lower chest wall to the pelvic brim. 2. Elevated left hemidiaphragm. Basilar parenchymal changes greater on the right may be related to aspiration. 3. 4.8 cm right renal cyst with peripheral calcification. Stability can be confirmed on follow-up. This is inadequately assessed on present exam secondary to streak artifact and lack of contrast. 4. Scoliosis with extensive fusion lower thoracic spine to sacrum. 5. Third spacing of fluid. 6.  Aortic Atherosclerosis (ICD10-I70.0). 7. Sacral decubitus. 8. Small calcified gallstones. These results will be called to the ordering clinician or representative by the Radiologist Assistant, and communication documented in the PACS or zVision Dashboard. Electronically Signed   By: Lacy Duverney M.D.   On: 10/30/2017 10:30    Assessment/Plan Principal Problem:   Acute on chronic respiratory failure with hypoxia [facility]) Active Problems:   Disorders of diaphragm   Atrial fibrillation (HCC)   Incomplete quadriplegia at C5-6 level (HCC)   History of stroke   1. Acute on chronic respiratory failure with hypoxia we will continue with full support on pressure assist control mode as noted previously patient is not wean able.  She will be discharged on the ventilator to rehab hopefully 2. Diaphragmatic paralysis we will continue with present management. 3. Atrial fibrillation chronic rate controlled 4. Incomplete quadriplegia C5-6 level we will continue present management. 5. History of stroke at baseline   I have  personally seen and evaluated the patient, evaluated laboratory and imaging results, formulated the assessment and plan and placed orders. The Patient requires high complexity decision making for assessment and support.  Case was discussed on Rounds with the Respiratory Therapy Staff  Yevonne Pax, MD [facility] Pulmonary Critical Care Medicine Sleep Medicine

## 2017-11-02 NOTE — Progress Notes (Signed)
Pulmonary Critical Care Medicine [facility] [place]   PULMONARY SERVICE  PROGRESS NOTE  Date of Service: 11/02/2017  Lindsay Mills  [id number]  DOB: [birth year]   DOA: 09/18/2017  Referring Physician: Carron Curie, MD  HPI: Lindsay Mills is a 72 y.o. female seen for follow up of Acute on Chronic Respiratory Failure.  Patient is on full support currently on pressure assist control mode on 28% FiO2 saturations are excellent patients with a PEEP of 5  Medications: Reviewed on Rounds  Physical Exam:  Vitals: Temperature 98.0 pulse 80 respiratory 18 blood pressure 149/86 saturations 98%  Ventilator Settings mode of ventilation pressure assist control FiO2 20% tidal volume 473 PEEP 5  . General: Comfortable at this time . Eyes: Grossly normal lids, irises & conjunctiva . ENT: grossly tongue is normal . Neck: no obvious mass . Cardiovascular: S1 S2 normal no gallop . Respiratory: Coarse breath sounds few scattered rhonchi are noted . Abdomen: soft . Skin: no rash seen on limited exam . Musculoskeletal: not rigid . Psychiatric:unable to assess . Neurologic: no seizure no involuntary movements         Lab Data:   Basic Metabolic Panel: Recent Labs  Lab 10/28/17 0739 10/30/17 0734 10/31/17 0443 11/01/17 0634  NA 138 138 138 139  K 4.0 3.8 3.4* 4.0  CL 99 101 103 104  CO2 30 29 27 26   GLUCOSE 106* 99 83 100*  BUN 11 9 9  7*  CREATININE <0.30* <0.30* 0.32* <0.30*  CALCIUM 7.6* 7.7* 7.4* 7.6*  MG 1.9 1.8 1.8 1.7  PHOS  --  1.6* 2.2* 2.4*    Liver Function Tests: No results for input(s): AST, ALT, ALKPHOS, BILITOT, PROT, ALBUMIN in the last 168 hours. No results for input(s): LIPASE, AMYLASE in the last 168 hours. No results for input(s): AMMONIA in the last 168 hours.  CBC: Recent Labs  Lab 10/28/17 0739 10/30/17 0734 10/31/17 0443  WBC 12.8* 10.4 9.9  NEUTROABS 11.0*  --   --   HGB 9.9* 8.7* 8.7*  HCT 33.1* 29.1* 28.3*  MCV 86.6 84.8  82.7  PLT 218 251 251    Cardiac Enzymes: No results for input(s): CKTOTAL, CKMB, CKMBINDEX, TROPONINI in the last 168 hours.  BNP (last 3 results) No results for input(s): BNP in the last 8760 hours.  ProBNP (last 3 results) No results for input(s): PROBNP in the last 8760 hours.  Radiological Exams: Dg Abdomen Peg Tube Location  Result Date: 11/01/2017 CLINICAL DATA:  Gastrostomy tube placement several days ago. EXAM: ABDOMEN - 1 VIEW COMPARISON:  CT abdomen pelvis dated October 30, 2017. FINDINGS: Normal opacification of small bowel loops after contrast injection through the gastrostomy tube. No definite extravasated contrast. Nonobstructive bowel gas pattern. Extensive thoracolumbar fusion hardware. IMPRESSION: Appropriately positioned gastrostomy tube with normal opacification of proximal small bowel after contrast injection. Electronically Signed   By: Obie Dredge M.D.   On: 11/01/2017 14:49    Assessment/Plan Principal Problem:   Acute on chronic respiratory failure with hypoxia (HCC) Active Problems:   Disorders of diaphragm   Atrial fibrillation (HCC)   Incomplete quadriplegia at C5-6 level (HCC)   History of stroke   1. Acute on chronic respiratory failure with hypoxia we will continue with full support on pressure assist control mode.  She is not weaning has not been able to tolerate weaning. 2. Diaphragmatic paralysis continue with supportive care at baseline 3. Chronic atrial fibrillation rate is controlled 4. Incomplete quadriplegia as noted  above respiratory therapy 5. History of stroke physical therapy   I have personally seen and evaluated the patient, evaluated laboratory and imaging results, formulated the assessment and plan and placed orders. The Patient requires high complexity decision making for assessment and support.  Case was discussed on Rounds with the Respiratory Therapy Staff  Yevonne Pax, MD [facility] Pulmonary Critical Care Medicine Sleep  Medicine

## 2017-11-03 NOTE — Progress Notes (Signed)
Pulmonary [place]   PULMONARY SERVICE  PROGRESS NOTE  Date of Service: 11/03/2017  Lindsay Mills  [id number]  DOB: 11/24/1945   DOA: 09/18/2017  Referring Physician: Merton Border, MD  HPI: Lindsay Mills is a 72 y.o. female seen for follow up of Acute on Chronic Respiratory Failure.  She has not been doing very well as far as weaning is concerned remains on 40% oxygen is on a PEEP of 5  Medications: Reviewed on Rounds  Physical Exam:  Vitals: Temperature 98.4 pulse 82 respiratory 20 blood pressure 142/74 saturations 98%  Ventilator Settings mode of ventilation pressure assist control FiO2 40% tidal volume 400 PEEP 5  . General: Comfortable at this time . Eyes: Grossly normal lids, irises & conjunctiva . ENT: grossly tongue is normal . Neck: no obvious mass . Cardiovascular: S1 S2 normal no gallop . Respiratory: No rhonchi or rales are noted at this time . Abdomen: soft . Skin: no rash seen on limited exam . Musculoskeletal: not rigid . Psychiatric:unable to assess . Neurologic: no seizure no involuntary movements         Lab Data:   Basic Metabolic Panel: Recent Labs  Lab 10/28/17 0739 10/30/17 0734 10/31/17 0443 11/01/17 0634  NA 138 138 138 139  K 4.0 3.8 3.4* 4.0  CL 99 101 103 104  CO2 30 29 27 26   GLUCOSE 106* 99 83 100*  BUN 11 9 9  7*  CREATININE <0.30* <0.30* 0.32* <0.30*  CALCIUM 7.6* 7.7* 7.4* 7.6*  MG 1.9 1.8 1.8 1.7  PHOS  --  1.6* 2.2* 2.4*    Liver Function Tests: No results for input(s): AST, ALT, ALKPHOS, BILITOT, PROT, ALBUMIN in the last 168 hours. No results for input(s): LIPASE, AMYLASE in the last 168 hours. No results for input(s): AMMONIA in the last 168 hours.  CBC: Recent Labs  Lab 10/28/17 0739 10/30/17 0734 10/31/17 0443  WBC 12.8* 10.4 9.9  NEUTROABS 11.0*  --   --   HGB 9.9* 8.7* 8.7*  HCT 33.1* 29.1* 28.3*  MCV 86.6 84.8 82.7  PLT 218 251 251    Cardiac  Enzymes: No results for input(s): CKTOTAL, CKMB, CKMBINDEX, TROPONINI in the last 168 hours.  BNP (last 3 results) No results for input(s): BNP in the last 8760 hours.  ProBNP (last 3 results) No results for input(s): PROBNP in the last 8760 hours.  Radiological Exams: No results found.  Assessment/Plan Principal Problem:   Acute on chronic respiratory failure with hypoxia (HCC) Active Problems:   Disorders of diaphragm   Atrial fibrillation (HCC)   Incomplete quadriplegia at C5-6 level (HCC)   History of stroke   1. Acute on chronic respiratory failure with hypoxia continue with full vent support she is not wean able 2. Diaphragmatic paralysis as above continue with full support 3. Chronic atrial fibrillation rate is controlled 4. Incomplete quadriplegia restorative therapy 5. History of stroke continue physical therapy as tolerated patient has been mostly refusing   I have personally seen and evaluated the patient, evaluated laboratory and imaging results, formulated the assessment and plan and placed orders. The Patient requires high complexity decision making for assessment and support.  Case was discussed on Rounds with the Respiratory Therapy Staff  Allyne Gee, MD [facility] Pulmonary Critical Care Medicine Sleep Medicine

## 2017-11-04 LAB — RENAL FUNCTION PANEL
ALBUMIN: 1.8 g/dL — AB (ref 3.5–5.0)
Anion gap: 7 (ref 5–15)
BUN: 12 mg/dL (ref 8–23)
CO2: 26 mmol/L (ref 22–32)
Calcium: 8 mg/dL — ABNORMAL LOW (ref 8.9–10.3)
Chloride: 106 mmol/L (ref 98–111)
Creatinine, Ser: 0.3 mg/dL — ABNORMAL LOW (ref 0.44–1.00)
GFR calc Af Amer: >60 mL/min (ref >60)
Glucose, Bld: 99 mg/dL (ref 70–99)
PHOSPHORUS: 2.7 mg/dL (ref 2.5–4.6)
POTASSIUM: 3.3 mmol/L — AB (ref 3.5–5.1)
Sodium: 139 mmol/L (ref 135–145)

## 2017-11-04 LAB — CBC
HEMATOCRIT: 30.3 % — AB (ref 36.0–46.0)
HEMOGLOBIN: 9.1 g/dL — AB (ref 12.0–15.0)
MCH: 25.3 pg — ABNORMAL LOW (ref 26.0–34.0)
MCHC: 30 g/dL (ref 30.0–36.0)
MCV: 84.4 fL (ref 78.0–100.0)
Platelets: 307 10*3/uL (ref 150–400)
RBC: 3.59 MIL/uL — ABNORMAL LOW (ref 3.87–5.11)
RDW: 18.1 % — ABNORMAL HIGH (ref 11.5–15.5)
WBC: 6.9 10*3/uL (ref 4.0–10.5)

## 2017-11-04 LAB — TRIGLYCERIDES: Triglycerides: 67 mg/dL (ref <150)

## 2017-11-04 LAB — MISC LABCORP TEST (SEND OUT): LABCORP TEST CODE: 183865

## 2017-11-04 LAB — MAGNESIUM: Magnesium: 1.8 mg/dL (ref 1.7–2.4)

## 2017-11-04 NOTE — Progress Notes (Signed)
Pulmonary [place]   PULMONARY SERVICE  PROGRESS NOTE  Date of Service: 11/04/2017  Lindsay Mills  [id number]  DOB: 06-06-1945   DOA: 09/18/2017  Referring Physician: Merton Border, MD  HPI: Lindsay Mills is a 72 y.o. female seen for follow up of Acute on Chronic Respiratory Failure.  Unfortunately not doing well as far as weaning is concerned patient spontaneous breathing index was poor right now remains on pressure assist control mode with a driving pressure of 25  Medications: Reviewed on Rounds  Physical Exam:  Vitals: Temperature 96.7 pulse 72 respiratory 18 blood pressure 115/62 saturations 98%  Ventilator Settings mode of ventilation pressure assist control FiO2 28% driving pressure 25 PEEP 5  . General: Comfortable at this time . Eyes: Grossly normal lids, irises & conjunctiva . ENT: grossly tongue is normal . Neck: no obvious mass . Cardiovascular: S1 S2 normal no gallop . Respiratory: Coarse rhonchi bilaterally . Abdomen: soft . Skin: no rash seen on limited exam . Musculoskeletal: not rigid . Psychiatric:unable to assess . Neurologic: no seizure no involuntary movements         Lab Data:   Basic Metabolic Panel: Recent Labs  Lab 10/30/17 0734 10/31/17 0443 11/01/17 0634 11/04/17 0813  NA 138 138 139 139  K 3.8 3.4* 4.0 3.3*  CL 101 103 104 106  CO2 29 27 26 26   GLUCOSE 99 83 100* 99  BUN 9 9 7* 12  CREATININE <0.30* 0.32* <0.30* 0.30*  CALCIUM 7.7* 7.4* 7.6* 8.0*  MG 1.8 1.8 1.7  --   PHOS 1.6* 2.2* 2.4* 2.7    Liver Function Tests: Recent Labs  Lab 11/04/17 0813  ALBUMIN 1.8*   No results for input(s): LIPASE, AMYLASE in the last 168 hours. No results for input(s): AMMONIA in the last 168 hours.  CBC: Recent Labs  Lab 10/30/17 0734 10/31/17 0443 11/04/17 0813  WBC 10.4 9.9 6.9  HGB 8.7* 8.7* 9.1*  HCT 29.1* 28.3* 30.3*  MCV 84.8 82.7 84.4  PLT 251 251 307    Cardiac  Enzymes: No results for input(s): CKTOTAL, CKMB, CKMBINDEX, TROPONINI in the last 168 hours.  BNP (last 3 results) No results for input(s): BNP in the last 8760 hours.  ProBNP (last 3 results) No results for input(s): PROBNP in the last 8760 hours.  Radiological Exams: No results found.  Assessment/Plan Principal Problem:   Acute on chronic respiratory failure with hypoxia (HCC) Active Problems:   Disorders of diaphragm   Atrial fibrillation (HCC)   Incomplete quadriplegia at C5-6 level (HCC)   History of stroke   1. Acute on chronic respiratory failure with hypoxia we will continue with full support patient is not wean able as already discussed previously 2. Diaphragmatic paralysis continue present management supportive care 3. Chronic atrial fibrillation rate is controlled 4. Incomplete quadriplegia continue with restorative therapy 5. History of stroke as above   I have personally seen and evaluated the patient, evaluated laboratory and imaging results, formulated the assessment and plan and placed orders. The Patient requires high complexity decision making for assessment and support.  Case was discussed on Rounds with the Respiratory Therapy Staff  Allyne Gee, MD [facility] Pulmonary Critical Care Medicine Sleep Medicine

## 2017-11-05 LAB — POTASSIUM: Potassium: 3.6 mmol/L (ref 3.5–5.1)

## 2017-11-05 NOTE — Progress Notes (Signed)
Pulmonary Critical Care Medicine [facility] [place]   PULMONARY SERVICE  PROGRESS NOTE  Date of Service: 11/05/2017  Lindsay Mills  [id number]:[id number]  DOB: [birth year]   DOA: 09/18/2017  Referring Physician: Carron Curie, MD  HPI: Lindsay Mills is a 72 y.o. female seen for follow up of Acute on Chronic Respiratory Failure.  She continues on the ventilator she is in full support unable to be weaned awaiting discharge planning  Medications: Reviewed on Rounds  Physical Exam:  Vitals: Temperature 96.8 pulse 70 respiratory 18 blood pressure 120/63 saturations 97%  Ventilator Settings mode of ventilation assist control FiO2 20% tidal volume 450 PEEP 5  . General: Comfortable at this time . Eyes: Grossly normal lids, irises & conjunctiva . ENT: grossly tongue is normal . Neck: no obvious mass . Cardiovascular: S1 S2 normal no gallop . Respiratory: No rhonchi or rales are noted at this time . Abdomen: soft . Skin: no rash seen on limited exam . Musculoskeletal: not rigid . Psychiatric:unable to assess . Neurologic: no seizure no involuntary movements         Lab Data:   Basic Metabolic Panel: Recent Labs  Lab 10/30/17 0734 10/31/17 0443 11/01/17 0634 11/04/17 0813 11/05/17 0600  NA 138 138 139 139  --   K 3.8 3.4* 4.0 3.3* 3.6  CL 101 103 104 106  --   CO2 29 27 26 26   --   GLUCOSE 99 83 100* 99  --   BUN 9 9 7* 12  --   CREATININE <0.30* 0.32* <0.30* 0.30*  --   CALCIUM 7.7* 7.4* 7.6* 8.0*  --   MG 1.8 1.8 1.7 1.8  --   PHOS 1.6* 2.2* 2.4* 2.7  --     Liver Function Tests: Recent Labs  Lab 11/04/17 0813  ALBUMIN 1.8*   No results for input(s): LIPASE, AMYLASE in the last 168 hours. No results for input(s): AMMONIA in the last 168 hours.  CBC: Recent Labs  Lab 10/30/17 0734 10/31/17 0443 11/04/17 0813  WBC 10.4 9.9 6.9  HGB 8.7* 8.7* 9.1*  HCT 29.1* 28.3* 30.3*  MCV 84.8 82.7 84.4  PLT 251 251 307    Cardiac Enzymes: No results  for input(s): CKTOTAL, CKMB, CKMBINDEX, TROPONINI in the last 168 hours.  BNP (last 3 results) No results for input(s): BNP in the last 8760 hours.  ProBNP (last 3 results) No results for input(s): PROBNP in the last 8760 hours.  Radiological Exams: No results found.  Assessment/Plan Principal Problem:   Acute on chronic respiratory failure with hypoxia (HCC) Active Problems:   Disorders of diaphragm   Atrial fibrillation (HCC)   Incomplete quadriplegia at C5-6 level (HCC)   History of stroke   1. Acute on chronic respiratory failure with hypoxia she is not amenable she is awaiting discharge planning will need to facility discharge once again discussed with the patient's husband and he wants to continue her on the ventilator and eventually take her home. 2. Diaphragmatic paralysis as above not amenable 3. Chronic atrial fibrillation rate is controlled 4. History of stroke continue with present management 5. C5-6 quadriplegia continue with therapy which she is actually refusing   I have personally seen and evaluated the patient, evaluated laboratory and imaging results, formulated the assessment and plan and placed orders. The Patient requires high complexity decision making for assessment and support.  Case was discussed on Rounds with the Respiratory Therapy Staff  Yevonne Pax, MD [facility] Pulmonary Critical  Care Medicine Sleep Medicine

## 2017-11-06 NOTE — Progress Notes (Signed)
Pulmonary [place]   PULMONARY SERVICE  PROGRESS NOTE  Date of Service: 11/06/2017  Lindsay Mills  [id number]:[id number]  DOB: October 14, 1945   DOA: 09/18/2017  Referring Physician: Merton Border, MD  HPI: Lindsay Mills is a 72 y.o. female seen for follow up of Acute on Chronic Respiratory Failure.  Comfortable without distress patient is on assist control mode on pressure mode she is awake she has not been able to tolerate any weaning.  Yesterday was only able to do about 20 to 30 minutes pressure support  Medications: Reviewed on Rounds  Physical Exam:  Vitals: Temperature 97.6 pulse 60 respiratory 18 blood pressure 148/80 saturations 100%  Ventilator Settings mode of ventilation pressure assist control FiO2 20% tidal volume 387 PEEP 5  . General: Comfortable at this time . Eyes: Grossly normal lids, irises & conjunctiva . ENT: grossly tongue is normal . Neck: no obvious mass . Cardiovascular: S1 S2 normal no gallop . Respiratory: No rhonchi or rales are noted at this time. . Abdomen: soft . Skin: no rash seen on limited exam . Musculoskeletal: not rigid . Psychiatric:unable to assess . Neurologic: no seizure no involuntary movements         Lab Data:   Basic Metabolic Panel: Recent Labs  Lab 10/31/17 0443 11/01/17 0634 11/04/17 0813 11/05/17 0600  NA 138 139 139  --   K 3.4* 4.0 3.3* 3.6  CL 103 104 106  --   CO2 27 26 26   --   GLUCOSE 83 100* 99  --   BUN 9 7* 12  --   CREATININE 0.32* <0.30* 0.30*  --   CALCIUM 7.4* 7.6* 8.0*  --   MG 1.8 1.7 1.8  --   PHOS 2.2* 2.4* 2.7  --     Liver Function Tests: Recent Labs  Lab 11/04/17 0813  ALBUMIN 1.8*   No results for input(s): LIPASE, AMYLASE in the last 168 hours. No results for input(s): AMMONIA in the last 168 hours.  CBC: Recent Labs  Lab 10/31/17 0443 11/04/17 0813  WBC 9.9 6.9  HGB 8.7* 9.1*  HCT 28.3* 30.3*  MCV 82.7 84.4  PLT 251 307     Cardiac Enzymes: No results for input(s): CKTOTAL, CKMB, CKMBINDEX, TROPONINI in the last 168 hours.  BNP (last 3 results) No results for input(s): BNP in the last 8760 hours.  ProBNP (last 3 results) No results for input(s): PROBNP in the last 8760 hours.  Radiological Exams: No results found.  Assessment/Plan Principal Problem:   Acute on chronic respiratory failure with hypoxia (HCC) Active Problems:   Disorders of diaphragm   Atrial fibrillation (HCC)   Incomplete quadriplegia at C5-6 level (HCC)   History of stroke   1. Acute on chronic respiratory failure with hypoxia patient continues on full vent support.  Spontaneous breathing index is poor she is not wean able.  We will continue with present management. 2. Diaphragmatic paralysis continue supportive care 3. Chronic atrial fibrillation rate is controlled we will follow along 4. History of stroke at baseline continue present management 5. Incomplete quadriplegia C5-6 level restorative therapy prognosis guarded   I have personally seen and evaluated the patient, evaluated laboratory and imaging results, formulated the assessment and plan and placed orders. The Patient requires high complexity decision making for assessment and support.  Case was discussed on Rounds with the Respiratory Therapy Staff  Allyne Gee, MD [facility] Pulmonary Critical Care Medicine Sleep Medicine

## 2017-11-07 LAB — BASIC METABOLIC PANEL
ANION GAP: 9 (ref 5–15)
BUN: 14 mg/dL (ref 8–23)
CHLORIDE: 107 mmol/L (ref 98–111)
CO2: 24 mmol/L (ref 22–32)
Calcium: 8.4 mg/dL — ABNORMAL LOW (ref 8.9–10.3)
Creatinine, Ser: 0.36 mg/dL — ABNORMAL LOW (ref 0.44–1.00)
GFR calc non Af Amer: >60 mL/min (ref >60)
Glucose, Bld: 88 mg/dL (ref 70–99)
POTASSIUM: 3 mmol/L — AB (ref 3.5–5.1)
SODIUM: 140 mmol/L (ref 135–145)

## 2017-11-07 LAB — CBC
HCT: 35 % — ABNORMAL LOW (ref 36.0–46.0)
Hemoglobin: 10.8 g/dL — ABNORMAL LOW (ref 12.0–15.0)
MCH: 25.2 pg — ABNORMAL LOW (ref 26.0–34.0)
MCHC: 30.9 g/dL (ref 30.0–36.0)
MCV: 81.6 fL (ref 78.0–100.0)
Platelets: 295 10*3/uL (ref 150–400)
RBC: 4.29 MIL/uL (ref 3.87–5.11)
RDW: 18.3 % — ABNORMAL HIGH (ref 11.5–15.5)
WBC: 5.8 10*3/uL (ref 4.0–10.5)

## 2017-11-07 LAB — MAGNESIUM: Magnesium: 1.9 mg/dL (ref 1.7–2.4)

## 2017-11-07 LAB — PHOSPHORUS: PHOSPHORUS: 2.5 mg/dL (ref 2.5–4.6)

## 2017-11-07 LAB — TRIGLYCERIDES: Triglycerides: 72 mg/dL (ref <150)

## 2017-11-07 NOTE — Progress Notes (Signed)
Pulmonary [place]   PULMONARY SERVICE  PROGRESS NOTE  Date of Service: 11/07/2017  Lindsay Mills  [id number]:[id number]  DOB: 06-23-1945   DOA: 09/18/2017  Referring Physician: Merton Border, MD  HPI: Lindsay Mills is a 72 y.o. female seen for follow up of Acute on Chronic Respiratory Failure.  Patient is comfortable without distress has been on full vent support on pressure assist control mode currently is on 20% FiO2.  Good saturations are noted  Medications: Reviewed on Rounds  Physical Exam:  Vitals: Temperature is 97.0 pulse 104 respiratory rate 18 blood pressure 117/70 saturations 99%  Ventilator Settings currently is on full support pressure assist control mode FiO2 20% tidal volume 383 PEEP 5  . General: Comfortable at this time . Eyes: Grossly normal lids, irises & conjunctiva . ENT: grossly tongue is normal . Neck: no obvious mass . Cardiovascular: S1 S2 normal no gallop . Respiratory: No rhonchi or rales are noted . Abdomen: soft . Skin: no rash seen on limited exam . Musculoskeletal: not rigid . Psychiatric:unable to assess . Neurologic: no seizure no involuntary movements         Lab Data:   Basic Metabolic Panel: Recent Labs  Lab 11/01/17 0634 11/04/17 0813 11/05/17 0600 11/07/17 0641  NA 139 139  --  140  K 4.0 3.3* 3.6 3.0*  CL 104 106  --  107  CO2 26 26  --  24  GLUCOSE 100* 99  --  88  BUN 7* 12  --  14  CREATININE <0.30* 0.30*  --  0.36*  CALCIUM 7.6* 8.0*  --  8.4*  MG 1.7 1.8  --  1.9  PHOS 2.4* 2.7  --  2.5    Liver Function Tests: Recent Labs  Lab 11/04/17 0813  ALBUMIN 1.8*   No results for input(s): LIPASE, AMYLASE in the last 168 hours. No results for input(s): AMMONIA in the last 168 hours.  CBC: Recent Labs  Lab 11/04/17 0813 11/07/17 0641  WBC 6.9 5.8  HGB 9.1* 10.8*  HCT 30.3* 35.0*  MCV 84.4 81.6  PLT 307 295    Cardiac Enzymes: No results for input(s): CKTOTAL,  CKMB, CKMBINDEX, TROPONINI in the last 168 hours.  BNP (last 3 results) No results for input(s): BNP in the last 8760 hours.  ProBNP (last 3 results) No results for input(s): PROBNP in the last 8760 hours.  Radiological Exams: No results found.  Assessment/Plan Principal Problem:   Acute on chronic respiratory failure with hypoxia (HCC) Active Problems:   Disorders of diaphragm   Atrial fibrillation (HCC)   Incomplete quadriplegia at C5-6 level (HCC)   History of stroke   1. Acute on chronic respiratory failure with hypoxia we will continue with pressure assist control continue pulmonary toilet secretion management right now patient is at baseline 2. Diaphragmatic paralysis continue with supportive care on the ventilator 3. Chronic atrial fibrillation rate is controlled we will follow along 4. C5-6 quadriplegia restorative therapy 5. History of stroke at baseline   I have personally seen and evaluated the patient, evaluated laboratory and imaging results, formulated the assessment and plan and placed orders. The Patient requires high complexity decision making for assessment and support.  Case was discussed on Rounds with the Respiratory Therapy Staff  Allyne Gee, MD [facility] Pulmonary Critical Care Medicine Sleep Medicine

## 2017-11-08 LAB — POTASSIUM: Potassium: 3.7 mmol/L (ref 3.5–5.1)

## 2017-11-08 NOTE — Progress Notes (Signed)
Pulmonary Critical Care Medicine [facility] [place]   PULMONARY SERVICE  PROGRESS NOTE  Date of Service: 11/08/2017  Lindsay Mills  [id number]  DOB: [birth year]   DOA: 09/18/2017  Referring Physician: Carron Curie, MD  HPI: Lindsay Mills is a 72 y.o. female seen for follow up of Acute on Chronic Respiratory Failure.  She remains on pressure assist control mode today.  This morning apparently was able to do 2 hours weaning on pressure support  Medications: Reviewed on Rounds  Physical Exam:  Vitals: Temperature 97.8 pulse 102 respiratory rate 18 blood pressure 134/71 saturations 98%  Ventilator Settings mode of ventilation pressure assist control FiO2 20% tidal volume 359 PEEP 5  . General: Comfortable at this time . Eyes: Grossly normal lids, irises & conjunctiva . ENT: grossly tongue is normal . Neck: no obvious mass . Cardiovascular: S1 S2 normal no gallop . Respiratory: No rhonchi or rales are noted . Abdomen: soft . Skin: no rash seen on limited exam . Musculoskeletal: not rigid . Psychiatric:unable to assess . Neurologic: no seizure no involuntary movements         Lab Data:   Basic Metabolic Panel: Recent Labs  Lab 11/04/17 0813 11/05/17 0600 11/07/17 0641 11/08/17 0451  NA 139  --  140  --   K 3.3* 3.6 3.0* 3.7  CL 106  --  107  --   CO2 26  --  24  --   GLUCOSE 99  --  88  --   BUN 12  --  14  --   CREATININE 0.30*  --  0.36*  --   CALCIUM 8.0*  --  8.4*  --   MG 1.8  --  1.9  --   PHOS 2.7  --  2.5  --     Liver Function Tests: Recent Labs  Lab 11/04/17 0813  ALBUMIN 1.8*   No results for input(s): LIPASE, AMYLASE in the last 168 hours. No results for input(s): AMMONIA in the last 168 hours.  CBC: Recent Labs  Lab 11/04/17 0813 11/07/17 0641  WBC 6.9 5.8  HGB 9.1* 10.8*  HCT 30.3* 35.0*  MCV 84.4 81.6  PLT 307 295    Cardiac Enzymes: No results for input(s): CKTOTAL, CKMB, CKMBINDEX, TROPONINI in the last  168 hours.  BNP (last 3 results) No results for input(s): BNP in the last 8760 hours.  ProBNP (last 3 results) No results for input(s): PROBNP in the last 8760 hours.  Radiological Exams: No results found.  Assessment/Plan Principal Problem:   Acute on chronic respiratory failure with hypoxia (HCC) Active Problems:   Disorders of diaphragm   Atrial fibrillation (HCC)   Incomplete quadriplegia at C5-6 level (HCC)   History of stroke   1. Acute on chronic respiratory failure with hypoxia we will continue with trying on pressure support which she was able to briefly tolerate.  Continue pulmonary toilet secretion management. 2. Disorders of diaphragm overall she is not going to be able to be liberated completely from the ventilator. 3. Atrial fibrillation rate is controlled 4. Incomplete quadriplegia we will continue present management. 5. History of stroke stable continue restorative therapy   I have personally seen and evaluated the patient, evaluated laboratory and imaging results, formulated the assessment and plan and placed orders. The Patient requires high complexity decision making for assessment and support.  Case was discussed on Rounds with the Respiratory Therapy Staff  Yevonne Pax, MD [facility] Pulmonary Critical Care Medicine Sleep  Medicine

## 2017-11-09 NOTE — Progress Notes (Signed)
Pulmonary Critical Care Medicine [facility] [place]   PULMONARY SERVICE  PROGRESS NOTE  Date of Service: 11/09/2017  [name]  [id number]  DOB: 1945-06-20   DOA: 09/18/2017  Referring Physician: Carron Curie, MD  HPI: Lindsay Mills is a 72 y.o. female seen for follow up of Acute on Chronic Respiratory Failure.  Patient is on full support currently on pressure assist control mode has been on 28% FiO2.  Patient was able to do pressure support for 2 hours yesterday  Medications: Reviewed on Rounds  Physical Exam:  Vitals: Temperature 97.0 pulse 70 respiratory rate 18 blood pressure 131/67 saturations 98%  Ventilator Settings mode of ventilation pressure assist control FiO2 28% tidal volume 472 PEEP 5  . General: Comfortable at this time . Eyes: Grossly normal lids, irises & conjunctiva . ENT: grossly tongue is normal . Neck: no obvious mass . Cardiovascular: S1 S2 normal no gallop . Respiratory: No rhonchi or rales are noted at this time . Abdomen: soft . Skin: no rash seen on limited exam . Musculoskeletal: not rigid . Psychiatric:unable to assess . Neurologic: no seizure no involuntary movements         Lab Data:   Basic Metabolic Panel: Recent Labs  Lab 11/04/17 0813 11/05/17 0600 11/07/17 0641 11/08/17 0451  NA 139  --  140  --   K 3.3* 3.6 3.0* 3.7  CL 106  --  107  --   CO2 26  --  24  --   GLUCOSE 99  --  88  --   BUN 12  --  14  --   CREATININE 0.30*  --  0.36*  --   CALCIUM 8.0*  --  8.4*  --   MG 1.8  --  1.9  --   PHOS 2.7  --  2.5  --     Liver Function Tests: Recent Labs  Lab 11/04/17 0813  ALBUMIN 1.8*   No results for input(s): LIPASE, AMYLASE in the last 168 hours. No results for input(s): AMMONIA in the last 168 hours.  CBC: Recent Labs  Lab 11/04/17 0813 11/07/17 0641  WBC 6.9 5.8  HGB 9.1* 10.8*  HCT 30.3* 35.0*  MCV 84.4 81.6  PLT 307 295    Cardiac Enzymes: No results for input(s): CKTOTAL,  CKMB, CKMBINDEX, TROPONINI in the last 168 hours.  BNP (last 3 results) No results for input(s): BNP in the last 8760 hours.  ProBNP (last 3 results) No results for input(s): PROBNP in the last 8760 hours.  Radiological Exams: No results found.  Assessment/Plan Principal Problem:   Acute on chronic respiratory failure with hypoxia (HCC) Active Problems:   Disorders of diaphragm   Atrial fibrillation (HCC)   Incomplete quadriplegia at C5-6 level (HCC)   History of stroke   1. Acute on chronic respiratory failure with hypoxia we will continue with pulmonary toilet secretion management patient is tolerating the wean for only about 2 hours.  We will continue to monitor. 2. Disorder features of the diaphragm with paralysis we will continue with supportive care 3. Atrial fibrillation rate is controlled we will monitor 4. C5-6 quad incomplete we will continue supportive care restorative therapy 5. History of stroke stable at this time will monitor   I have personally seen and evaluated the patient, evaluated laboratory and imaging results, formulated the assessment and plan and placed orders. The Patient requires high complexity decision making for assessment and support.  Case was discussed on Rounds with  the Respiratory Therapy Staff  Yevonne Pax, MD [facility] Pulmonary Critical Care Medicine Sleep Medicine

## 2017-11-10 ENCOUNTER — Other Ambulatory Visit (HOSPITAL_COMMUNITY): Payer: Self-pay

## 2017-11-10 DIAGNOSIS — R109 Unspecified abdominal pain: Secondary | ICD-10-CM | POA: Diagnosis not present

## 2017-11-10 LAB — BASIC METABOLIC PANEL
Anion gap: 9 (ref 5–15)
BUN: 7 mg/dL — AB (ref 8–23)
CO2: 22 mmol/L (ref 22–32)
CREATININE: 0.33 mg/dL — AB (ref 0.44–1.00)
Calcium: 8.1 mg/dL — ABNORMAL LOW (ref 8.9–10.3)
Chloride: 105 mmol/L (ref 98–111)
GFR calc Af Amer: >60 mL/min (ref >60)
GFR calc non Af Amer: >60 mL/min (ref >60)
Glucose, Bld: 87 mg/dL (ref 70–99)
Potassium: 3.7 mmol/L (ref 3.5–5.1)
SODIUM: 136 mmol/L (ref 135–145)

## 2017-11-10 LAB — TRIGLYCERIDES: Triglycerides: 73 mg/dL (ref <150)

## 2017-11-10 LAB — MAGNESIUM: Magnesium: 1.8 mg/dL (ref 1.7–2.4)

## 2017-11-10 LAB — PHOSPHORUS: Phosphorus: 1.8 mg/dL — ABNORMAL LOW (ref 2.5–4.6)

## 2017-11-10 MED ORDER — IOHEXOL 300 MG/ML  SOLN
100.0000 mL | Freq: Once | INTRAMUSCULAR | Status: AC | PRN
  Administered 2017-11-10: 100 mL via INTRAVENOUS

## 2017-11-10 NOTE — Progress Notes (Signed)
Pulmonary [place]   PULMONARY SERVICE  PROGRESS NOTE  Date of Service: 11/10/2017  Lindsay Mills  VZC:[id number]  DOB: 12-10-1945   DOA: 09/18/2017  Referring Physician: Merton Border, MD  HPI: Lindsay Mills is a 72 y.o. female seen for follow up of Acute on Chronic Respiratory Failure.  Patient right now is comfortable without distress has been on pressure support weaning goal is for about 8 hours today  Medications: Reviewed on Rounds  Physical Exam:  Vitals: Temperature 97.6 pulse 76 respiratory rate 28 blood pressure 134/74 saturations 96%  Ventilator Settings currently on pressure support FiO2 28% tidal volume 360 pressure support 16 PEEP 5  . General: Comfortable at this time . Eyes: Grossly normal lids, irises & conjunctiva . ENT: grossly tongue is normal . Neck: no obvious mass . Cardiovascular: S1 S2 normal no gallop . Respiratory: No rhonchi or rales are noted . Abdomen: soft . Skin: no rash seen on limited exam . Musculoskeletal: not rigid . Psychiatric:unable to assess . Neurologic: no seizure no involuntary movements         Lab Data:   Basic Metabolic Panel: Recent Labs  Lab 11/04/17 0813 11/05/17 0600 11/07/17 0641 11/08/17 0451 11/10/17 0636  NA 139  --  140  --  136  K 3.3* 3.6 3.0* 3.7 3.7  CL 106  --  107  --  105  CO2 26  --  24  --  22  GLUCOSE 99  --  88  --  87  BUN 12  --  14  --  7*  CREATININE 0.30*  --  0.36*  --  0.33*  CALCIUM 8.0*  --  8.4*  --  8.1*  MG 1.8  --  1.9  --  1.8  PHOS 2.7  --  2.5  --  1.8*    Liver Function Tests: Recent Labs  Lab 11/04/17 0813  ALBUMIN 1.8*   No results for input(s): LIPASE, AMYLASE in the last 168 hours. No results for input(s): AMMONIA in the last 168 hours.  CBC: Recent Labs  Lab 11/04/17 0813 11/07/17 0641  WBC 6.9 5.8  HGB 9.1* 10.8*  HCT 30.3* 35.0*  MCV 84.4 81.6  PLT 307 295    Cardiac Enzymes: No results for  input(s): CKTOTAL, CKMB, CKMBINDEX, TROPONINI in the last 168 hours.  BNP (last 3 results) No results for input(s): BNP in the last 8760 hours.  ProBNP (last 3 results) No results for input(s): PROBNP in the last 8760 hours.  Radiological Exams: No results found.  Assessment/Plan Principal Problem:   Acute on chronic respiratory failure with hypoxia (HCC) Active Problems:   Disorders of diaphragm   Atrial fibrillation (HCC)   Incomplete quadriplegia at C5-6 level (HCC)   History of stroke   1. Acute on chronic respiratory failure with hypoxia patient is doing fine with weaning on pressure support titrate oxygen continue pulmonary toilet supportive care 2. Diaphragmatic paralysis supportive care 3. Atrial fibrillation rate is controlled 4. Incomplete quadriplegia continue with physical therapy 5. History of stroke at baseline right now   I have personally seen and evaluated the patient, evaluated laboratory and imaging results, formulated the assessment and plan and placed orders. The Patient requires high complexity decision making for assessment and support.  Case was discussed on Rounds with the Respiratory Therapy Staff  Allyne Gee, MD [facility] Pulmonary Critical Care Medicine Sleep Medicine

## 2017-11-11 LAB — PHOSPHORUS: Phosphorus: 5 mg/dL — ABNORMAL HIGH (ref 2.5–4.6)

## 2017-11-11 LAB — CBC
HCT: 27.4 % — ABNORMAL LOW (ref 36.0–46.0)
HCT: 33.5 % — ABNORMAL LOW (ref 36.0–46.0)
Hemoglobin: 8.6 g/dL — ABNORMAL LOW (ref 12.0–15.0)
Hemoglobin: 10.2 g/dL — ABNORMAL LOW (ref 12.0–15.0)
MCH: 29.9 pg (ref 26.0–34.0)
MCH: 25.4 pg — ABNORMAL LOW (ref 26.0–34.0)
MCHC: 31.4 g/dL (ref 30.0–36.0)
MCHC: 30.4 g/dL (ref 30.0–36.0)
MCV: 95.1 fL (ref 78.0–100.0)
MCV: 83.5 fL (ref 78.0–100.0)
PLATELETS: 59 10*3/uL — AB (ref 150–400)
Platelets: 295 10*3/uL (ref 150–400)
RBC: 2.88 MIL/uL — ABNORMAL LOW (ref 3.87–5.11)
RBC: 4.01 MIL/uL (ref 3.87–5.11)
RDW: 15.5 % (ref 11.5–15.5)
RDW: 18.6 % — AB (ref 11.5–15.5)
WBC: 2.7 10*3/uL — ABNORMAL LOW (ref 4.0–10.5)
WBC: 4.7 10*3/uL (ref 4.0–10.5)

## 2017-11-11 LAB — MAGNESIUM: MAGNESIUM: 1.8 mg/dL (ref 1.7–2.4)

## 2017-11-11 NOTE — Progress Notes (Signed)
Pulmonary Critical Care Medicine [facility], [facility], [facility] [place]   PULMONARY SERVICE  PROGRESS NOTE  Date of Service: 11/11/2017  Lindsay Mills  VQQ:[id number]  DOB: January 10, 1946   DOA: 09/18/2017  Referring Physician: Carron Curie, MD  HPI: Lindsay Mills is a 72 y.o. female seen for follow up of Acute on Chronic Respiratory Failure.  Patient is on pressure support mode at this time is been on 28% oxygen.  Good volumes are noted was able to wean 8 hours yesterday on pressure support  Medications: Reviewed on Rounds  Physical Exam:  Vitals: Temperature 97.3 pulse 81 respiratory rate 16 blood pressure 146/76 saturations 97%  Ventilator Settings mode of ventilation pressure support FiO2 28% tidal volume 362 pressure support 16 PEEP 5  . General: Comfortable at this time . Eyes: Grossly normal lids, irises & conjunctiva . ENT: grossly tongue is normal . Neck: no obvious mass . Cardiovascular: S1 S2 normal no gallop . Respiratory: No rhonchi or rales are noted . Abdomen: soft . Skin: no rash seen on limited exam . Musculoskeletal: not rigid . Psychiatric:unable to assess . Neurologic: no seizure no involuntary movements         Lab Data:   Basic Metabolic Panel: Recent Labs  Lab 11/05/17 0600 11/07/17 0641 11/08/17 0451 11/10/17 0636 11/11/17 0616  NA  --  140  --  136  --   K 3.6 3.0* 3.7 3.7  --   CL  --  107  --  105  --   CO2  --  24  --  22  --   GLUCOSE  --  88  --  87  --   BUN  --  14  --  7*  --   CREATININE  --  0.36*  --  0.33*  --   CALCIUM  --  8.4*  --  8.1*  --   MG  --  1.9  --  1.8  --   PHOS  --  2.5  --  1.8* 5.0*    Liver Function Tests: No results for input(s): AST, ALT, ALKPHOS, BILITOT, PROT, ALBUMIN in the last 168 hours. No results for input(s): LIPASE, AMYLASE in the last 168 hours. No results for input(s): AMMONIA in the last 168 hours.  CBC: Recent Labs  Lab 11/07/17 0641 11/11/17 0616  WBC 5.8 2.7*  HGB 10.8* 8.6*  HCT  35.0* 27.4*  MCV 81.6 95.1  PLT 295 59*    Cardiac Enzymes: No results for input(s): CKTOTAL, CKMB, CKMBINDEX, TROPONINI in the last 168 hours.  BNP (last 3 results) No results for input(s): BNP in the last 8760 hours.  ProBNP (last 3 results) No results for input(s): PROBNP in the last 8760 hours.  Radiological Exams: Ct Abdomen Pelvis W Contrast  Result Date: 11/10/2017 CLINICAL DATA:  Acute generalized abdominal pain question leakage from PEG tube EXAM: CT ABDOMEN AND PELVIS WITH CONTRAST TECHNIQUE: Multidetector CT imaging of the abdomen and pelvis was performed using the standard protocol following bolus administration of intravenous contrast. Sagittal and coronal MPR images reconstructed from axial data set. Extensive beam hardening artifacts from orthopedic hardware in the thoracolumbosacral spine. CONTRAST:  OMNIPAQUE IOHEXOL 300 MG/ML SOLN IV. No oral contrast. COMPARISON:  10/30/2017 FINDINGS: Lower chest: Eventration versus posterior LEFT diaphragmatic hernia containing stomach and colon. Subsegmental atelectasis RIGHT base. Hepatobiliary: Gallbladder and liver unremarkable Pancreas: Normal appearance Spleen: Small but otherwise unremarkable Adrenals/Urinary Tract: Adrenal glands unremarkable. Large cyst at upper pole RIGHT kidney  laterally measures 3.8 x 3.7 cm image 23 and demonstrates a minimally thickened wall, slightly decreased in size since previous study. Kidneys and bladder otherwise unremarkable. Ureters poorly visualized in their courses but no hydronephrosis is seen. Stomach/Bowel: Gastrostomy tube within stomach. Bowel loops grossly unremarkable. Normal appendix. Vascular/Lymphatic: Vascular structures grossly patent. Minimal atherosclerotic calcification aorta. Aorta normal caliber. No adenopathy. Reproductive: Atrophic uterus. Probable visualization of atrophic ovaries. Other: Free fluid in pelvis. Extensive subcutaneous and tissue plana deep throughout abdomen and  pelvis. Subcutaneous fluid collection with enhancing margin in LEFT upper quadrant lateral and inferior to the gastrostomy tube site consistent with subcutaneous abscess measuring 3.7 x 4.6 x 1.4 cm. No intra-abdominal abscess. No hernia. Musculoskeletal: Osseous demineralization. Beam hardening artifacts from prior spinal fixation which extends cranially from the sacrum through the lumbar spine into the lower thoracic spine to cranial extent of exam. Multilevel disc prostheses. Hip joints preserved. IMPRESSION: Subcutaneous fluid collection with enhancing margin 3.7 x 4.6 x 1.4 cm in size inferolateral to the gastrostomy tube site consistent with subcutaneous abscess. No intra-abdominal abscess or free air. Small amount of free fluid in pelvis as well as diffuse soft tissue edema throughout abdomen and pelvis. Complicated cyst RIGHT kidney demonstrating mild wall thickening, slightly smaller than on previous exam. Electronically Signed   By: Ulyses Southward M.D.   On: 11/10/2017 19:09    Assessment/Plan Principal Problem:   Acute on chronic respiratory failure with hypoxia [facility]) Active Problems:   Disorders of diaphragm   Atrial fibrillation (HCC)   Incomplete quadriplegia at C5-6 level (HCC)   History of stroke   1. Acute on chronic respiratory failure with hypoxia continue with weaning attempts.  She is actually been surprisingly well continue pulmonary toilet supportive care. 2. Diaphragmatic paralysis at baseline continue monitoring 3. Chronic atrial fibrillation rate is controlled we will follow 4. Incomplete quadriplegia continue with supportive care restorative therapy 5. History of stroke unchanged   I have personally seen and evaluated the patient, evaluated laboratory and imaging results, formulated the assessment and plan and placed orders. The Patient requires high complexity decision making for assessment and support.  Case was discussed on Rounds with the Respiratory Therapy  Staff  Yevonne Pax, MD [facility] Pulmonary Critical Care Medicine Sleep Medicine

## 2017-11-12 ENCOUNTER — Other Ambulatory Visit (HOSPITAL_COMMUNITY): Payer: Self-pay

## 2017-11-12 DIAGNOSIS — R109 Unspecified abdominal pain: Secondary | ICD-10-CM | POA: Diagnosis not present

## 2017-11-12 MED ORDER — IOPAMIDOL (ISOVUE-300) INJECTION 61%
INTRAVENOUS | Status: AC
  Administered 2017-11-12: 50 mL via GASTROSTOMY
  Filled 2017-11-12: qty 50

## 2017-11-12 NOTE — Progress Notes (Signed)
Pulmonary Critical Care Medicine [facility] [place]   PULMONARY SERVICE  PROGRESS NOTE  Date of Service: 11/12/2017  Lindsay Mills  [id number]  DOB: 1945-12-02   DOA: 09/18/2017  Referring Physician: Carron Curie, MD  HPI: Lindsay Mills is a 72 y.o. female seen for follow up of Acute on Chronic Respiratory Failure.  Patient right now is on full support currently on pressure support.  Has been on 28% FiO2 the patient did wean on pressure support for 16 hours yesterday she has been very slow requiring the pressure support level of 16 to achieve that wean  Medications: Reviewed on Rounds  Physical Exam:  Vitals: Temperature 98.0 pulse 84 respiratory 19 blood pressure 144/85 saturations 98%  Ventilator Settings mode of ventilation pressure support FiO2 20% tidal volume 300 pressure support 16 PEEP 5  . General: Comfortable at this time . Eyes: Grossly normal lids, irises & conjunctiva . ENT: grossly tongue is normal . Neck: no obvious mass . Cardiovascular: S1 S2 normal no gallop . Respiratory: No rhonchi or rales are noted at this time. . Abdomen: soft . Skin: no rash seen on limited exam . Musculoskeletal: not rigid . Psychiatric:unable to assess . Neurologic: no seizure no involuntary movements         Lab Data:   Basic Metabolic Panel: Recent Labs  Lab 11/07/17 0641 11/08/17 0451 11/10/17 0636 11/11/17 0616 11/11/17 1503  NA 140  --  136  --   --   K 3.0* 3.7 3.7  --   --   CL 107  --  105  --   --   CO2 24  --  22  --   --   GLUCOSE 88  --  87  --   --   BUN 14  --  7*  --   --   CREATININE 0.36*  --  0.33*  --   --   CALCIUM 8.4*  --  8.1*  --   --   MG 1.9  --  1.8  --  1.8  PHOS 2.5  --  1.8* 5.0*  --     Liver Function Tests: No results for input(s): AST, ALT, ALKPHOS, BILITOT, PROT, ALBUMIN in the last 168 hours. No results for input(s): LIPASE, AMYLASE in the last 168 hours. No results for input(s): AMMONIA in the last 168  hours.  CBC: Recent Labs  Lab 11/07/17 0641 11/11/17 0616 11/11/17 1503  WBC 5.8 2.7* 4.7  HGB 10.8* 8.6* 10.2*  HCT 35.0* 27.4* 33.5*  MCV 81.6 95.1 83.5  PLT 295 59* 295    Cardiac Enzymes: No results for input(s): CKTOTAL, CKMB, CKMBINDEX, TROPONINI in the last 168 hours.  BNP (last 3 results) No results for input(s): BNP in the last 8760 hours.  ProBNP (last 3 results) No results for input(s): PROBNP in the last 8760 hours.  Radiological Exams: Ct Abdomen Pelvis W Contrast  Result Date: 11/10/2017 CLINICAL DATA:  Acute generalized abdominal pain question leakage from PEG tube EXAM: CT ABDOMEN AND PELVIS WITH CONTRAST TECHNIQUE: Multidetector CT imaging of the abdomen and pelvis was performed using the standard protocol following bolus administration of intravenous contrast. Sagittal and coronal MPR images reconstructed from axial data set. Extensive beam hardening artifacts from orthopedic hardware in the thoracolumbosacral spine. CONTRAST:  OMNIPAQUE IOHEXOL 300 MG/ML SOLN IV. No oral contrast. COMPARISON:  10/30/2017 FINDINGS: Lower chest: Eventration versus posterior LEFT diaphragmatic hernia containing stomach and colon. Subsegmental atelectasis RIGHT base. Hepatobiliary:  Gallbladder and liver unremarkable Pancreas: Normal appearance Spleen: Small but otherwise unremarkable Adrenals/Urinary Tract: Adrenal glands unremarkable. Large cyst at upper pole RIGHT kidney laterally measures 3.8 x 3.7 cm image 23 and demonstrates a minimally thickened wall, slightly decreased in size since previous study. Kidneys and bladder otherwise unremarkable. Ureters poorly visualized in their courses but no hydronephrosis is seen. Stomach/Bowel: Gastrostomy tube within stomach. Bowel loops grossly unremarkable. Normal appendix. Vascular/Lymphatic: Vascular structures grossly patent. Minimal atherosclerotic calcification aorta. Aorta normal caliber. No adenopathy. Reproductive: Atrophic uterus.  Probable visualization of atrophic ovaries. Other: Free fluid in pelvis. Extensive subcutaneous and tissue plana deep throughout abdomen and pelvis. Subcutaneous fluid collection with enhancing margin in LEFT upper quadrant lateral and inferior to the gastrostomy tube site consistent with subcutaneous abscess measuring 3.7 x 4.6 x 1.4 cm. No intra-abdominal abscess. No hernia. Musculoskeletal: Osseous demineralization. Beam hardening artifacts from prior spinal fixation which extends cranially from the sacrum through the lumbar spine into the lower thoracic spine to cranial extent of exam. Multilevel disc prostheses. Hip joints preserved. IMPRESSION: Subcutaneous fluid collection with enhancing margin 3.7 x 4.6 x 1.4 cm in size inferolateral to the gastrostomy tube site consistent with subcutaneous abscess. No intra-abdominal abscess or free air. Small amount of free fluid in pelvis as well as diffuse soft tissue edema throughout abdomen and pelvis. Complicated cyst RIGHT kidney demonstrating mild wall thickening, slightly smaller than on previous exam. Electronically Signed   By: Ulyses Southward M.D.   On: 11/10/2017 19:09   Dg Abdomen Peg Tube Location  Result Date: 11/12/2017 CLINICAL DATA:  Assessment of gastrostomy catheter for possible leakage EXAM: ABDOMEN - 1 VIEW COMPARISON:  None. FINDINGS: Contrast was administered via a percutaneously placed gastrostomy catheter. Gastrostomy catheter is positioned in the stomach. Contrast flows from the stomach into the bowel. No contrast extravasation evident. The bowel gas pattern is unremarkable. No bowel obstruction or free air. There is elevation of the left hemidiaphragm. Lung bases are clear. IMPRESSION: Gastrostomy catheter positioned in the stomach. No contrast extravasation. Contrast flows freely through the stomach into the proximal bowel. No bowel obstruction or free air evident. Visualized lung bases clear. Electronically Signed   By: Bretta Bang III  M.D.   On: 11/12/2017 09:16    Assessment/Plan Principal Problem:   Acute on chronic respiratory failure with hypoxia (HCC) Active Problems:   Disorders of diaphragm   Atrial fibrillation (HCC)   Incomplete quadriplegia at C5-6 level (HCC)   History of stroke   1. Acute on chronic respiratory failure with hypoxia doing well continue with weaning on pressure support however I do not think that would really make much achievement long time because her pressure support requirement is still 16 we will continue to assess day-to-day. 2. Diaphragmatic paralysis continue present management supportive care 3. Chronic atrial fibrillation rate is controlled 4. Incomplete quadriplegia C5-6 level we will continue supportive care 5. History of stroke at baseline   I have personally seen and evaluated the patient, evaluated laboratory and imaging results, formulated the assessment and plan and placed orders. The Patient requires high complexity decision making for assessment and support.  Case was discussed on Rounds with the Respiratory Therapy Staff  Yevonne Pax, MD [facility] Pulmonary Critical Care Medicine Sleep Medicine

## 2017-11-13 LAB — BASIC METABOLIC PANEL
Anion gap: 12 (ref 5–15)
CO2: 23 mmol/L (ref 22–32)
CREATININE: 0.39 mg/dL — AB (ref 0.44–1.00)
Calcium: 8.4 mg/dL — ABNORMAL LOW (ref 8.9–10.3)
Chloride: 104 mmol/L (ref 98–111)
GFR calc non Af Amer: >60 mL/min (ref >60)
Glucose, Bld: 91 mg/dL (ref 70–99)
Potassium: 2.8 mmol/L — ABNORMAL LOW (ref 3.5–5.1)
SODIUM: 139 mmol/L (ref 135–145)

## 2017-11-13 LAB — CBC
HEMATOCRIT: 34.1 % — AB (ref 36.0–46.0)
Hemoglobin: 10.4 g/dL — ABNORMAL LOW (ref 12.0–15.0)
MCH: 25.1 pg — AB (ref 26.0–34.0)
MCHC: 30.5 g/dL (ref 30.0–36.0)
MCV: 82.4 fL (ref 78.0–100.0)
Platelets: 270 10*3/uL (ref 150–400)
RBC: 4.14 MIL/uL (ref 3.87–5.11)
RDW: 18.5 % — AB (ref 11.5–15.5)
WBC: 5.3 10*3/uL (ref 4.0–10.5)

## 2017-11-13 LAB — MAGNESIUM: MAGNESIUM: 1.7 mg/dL (ref 1.7–2.4)

## 2017-11-13 LAB — PHOSPHORUS: PHOSPHORUS: 1.5 mg/dL — AB (ref 2.5–4.6)

## 2017-11-13 LAB — TRIGLYCERIDES: TRIGLYCERIDES: 40 mg/dL (ref <150)

## 2017-11-13 NOTE — Progress Notes (Signed)
Pulmonary Critical Care Medicine [facility] [place]   PULMONARY SERVICE  PROGRESS NOTE  Date of Service: 11/13/2017  Lindsay Mills  [id number]  DOB: 12-28-1945   DOA: 09/18/2017  Referring Physician: Carron Curie, MD  HPI: Lindsay Mills is a 72 y.o. female seen for follow up of Acute on Chronic Respiratory Failure.  Patient remains on pressure support right now has been on 20% oxygen.  Patient was able to do about 16 hours of pressure support we will try to advance her.  Right now is on 16/5 pressure support  Medications: Reviewed on Rounds  Physical Exam:  Vitals: Temperature 97.5 pulse 79 respiratory 18 blood pressure 144/81 saturations 100%  Ventilator Settings mode of ventilation pressure support FiO2 20% tidal volume 345 pressure support 16 PEEP 5  . General: Comfortable at this time . Eyes: Grossly normal lids, irises & conjunctiva . ENT: grossly tongue is normal . Neck: no obvious mass . Cardiovascular: S1 S2 normal no gallop . Respiratory: No rhonchi or rales are noted at this time. . Abdomen: soft . Skin: no rash seen on limited exam . Musculoskeletal: not rigid . Psychiatric:unable to assess . Neurologic: no seizure no involuntary movements         Lab Data:   Basic Metabolic Panel: Recent Labs  Lab 11/07/17 0641 11/08/17 0451 11/10/17 0636 11/11/17 0616 11/11/17 1503 11/13/17 0549  NA 140  --  136  --   --  139  K 3.0* 3.7 3.7  --   --  2.8*  CL 107  --  105  --   --  104  CO2 24  --  22  --   --  23  GLUCOSE 88  --  87  --   --  91  BUN 14  --  7*  --   --  <5*  CREATININE 0.36*  --  0.33*  --   --  0.39*  CALCIUM 8.4*  --  8.1*  --   --  8.4*  MG 1.9  --  1.8  --  1.8 1.7  PHOS 2.5  --  1.8* 5.0*  --  1.5*    Liver Function Tests: No results for input(s): AST, ALT, ALKPHOS, BILITOT, PROT, ALBUMIN in the last 168 hours. No results for input(s): LIPASE, AMYLASE in the last 168 hours. No results for input(s): AMMONIA in  the last 168 hours.  CBC: Recent Labs  Lab 11/07/17 0641 11/11/17 0616 11/11/17 1503 11/13/17 0549  WBC 5.8 2.7* 4.7 5.3  HGB 10.8* 8.6* 10.2* 10.4*  HCT 35.0* 27.4* 33.5* 34.1*  MCV 81.6 95.1 83.5 82.4  PLT 295 59* 295 270    Cardiac Enzymes: No results for input(s): CKTOTAL, CKMB, CKMBINDEX, TROPONINI in the last 168 hours.  BNP (last 3 results) No results for input(s): BNP in the last 8760 hours.  ProBNP (last 3 results) No results for input(s): PROBNP in the last 8760 hours.  Radiological Exams: Dg Abdomen Peg Tube Location  Result Date: 11/12/2017 CLINICAL DATA:  Assessment of gastrostomy catheter for possible leakage EXAM: ABDOMEN - 1 VIEW COMPARISON:  None. FINDINGS: Contrast was administered via a percutaneously placed gastrostomy catheter. Gastrostomy catheter is positioned in the stomach. Contrast flows from the stomach into the bowel. No contrast extravasation evident. The bowel gas pattern is unremarkable. No bowel obstruction or free air. There is elevation of the left hemidiaphragm. Lung bases are clear. IMPRESSION: Gastrostomy catheter positioned in the stomach. No contrast extravasation. Contrast  flows freely through the stomach into the proximal bowel. No bowel obstruction or free air evident. Visualized lung bases clear. Electronically Signed   By: Bretta Bang III M.D.   On: 11/12/2017 09:16    Assessment/Plan Principal Problem:   Acute on chronic respiratory failure with hypoxia (HCC) Active Problems:   Disorders of diaphragm   Atrial fibrillation (HCC)   Incomplete quadriplegia at C5-6 level (HCC)   History of stroke   1. Acute on chronic respiratory failure with hypoxia we will continue with weaning on pressure support continue Foley toilet secretion management patient's overall prognosis is guarded we will consider decreasing the pressure support level to 14 see how she tolerates 2. Diaphragmatic paralysis continue with supportive care 3. Chronic  atrial fibrillation rate is controlled 4. Incomplete quadriplegia C5-6 continue supportive care restorative therapy 5. History of stroke at baseline   I have personally seen and evaluated the patient, evaluated laboratory and imaging results, formulated the assessment and plan and placed orders. The Patient requires high complexity decision making for assessment and support.  Case was discussed on Rounds with the Respiratory Therapy Staff  Yevonne Pax, MD [facility] Pulmonary Critical Care Medicine Sleep Medicine

## 2017-11-14 LAB — BASIC METABOLIC PANEL
ANION GAP: 13 (ref 5–15)
BUN: 5 mg/dL — ABNORMAL LOW (ref 8–23)
CALCIUM: 8 mg/dL — AB (ref 8.9–10.3)
CHLORIDE: 106 mmol/L (ref 98–111)
CO2: 18 mmol/L — AB (ref 22–32)
CREATININE: 0.38 mg/dL — AB (ref 0.44–1.00)
GFR calc non Af Amer: >60 mL/min (ref >60)
GLUCOSE: 95 mg/dL (ref 70–99)
Potassium: 4.9 mmol/L (ref 3.5–5.1)
Sodium: 137 mmol/L (ref 135–145)

## 2017-11-14 LAB — MAGNESIUM: Magnesium: 1.8 mg/dL (ref 1.7–2.4)

## 2017-11-14 LAB — PHOSPHORUS: PHOSPHORUS: 2 mg/dL — AB (ref 2.5–4.6)

## 2017-11-14 NOTE — Progress Notes (Signed)
Pulmonary Critical Care Medicine [name]   PULMONARY SERVICE  PROGRESS NOTE  Date of Service: 11/14/2017  Lindsay Mills  [id number]  DOB: 11-Jun-1945   DOA: 09/18/2017  Referring Physician: Carron Curie, MD  HPI: Lindsay Mills is a 72 y.o. female seen for follow up of Acute on Chronic Respiratory Failure.  She continues to wean on pressure support she is on 28% oxygen with pressure support 16 PEEP 5 doing fairly well at this time  Medications: Reviewed on Rounds  Physical Exam:  Vitals: Temperature 97.3 pulse 106 respiratory rate 20 blood pressure 163/90 saturations 99%  Ventilator Settings mode of ventilation pressure support FiO2 28% pressure support 16 PEEP 5  . General: Comfortable at this time . Eyes: Grossly normal lids, irises & conjunctiva . ENT: grossly tongue is normal . Neck: no obvious mass . Cardiovascular: S1 S2 normal no gallop . Respiratory: No rhonchi or rales are noted . Abdomen: soft . Skin: no rash seen on limited exam . Musculoskeletal: not rigid . Psychiatric:unable to assess . Neurologic: no seizure no involuntary movements         Lab Data:   Basic Metabolic Panel: Recent Labs  Lab 11/08/17 0451 11/10/17 0636 11/11/17 0616 11/11/17 1503 11/13/17 0549 11/14/17 0616  NA  --  136  --   --  139 137  K 3.7 3.7  --   --  2.8* 4.9  CL  --  105  --   --  104 106  CO2  --  22  --   --  23 18*  GLUCOSE  --  87  --   --  91 95  BUN  --  7*  --   --  <5* 5*  CREATININE  --  0.33*  --   --  0.39* 0.38*  CALCIUM  --  8.1*  --   --  8.4* 8.0*  MG  --  1.8  --  1.8 1.7 1.8  PHOS  --  1.8* 5.0*  --  1.5* 2.0*    Liver Function Tests: No results for input(s): AST, ALT, ALKPHOS, BILITOT, PROT, ALBUMIN in the last 168 hours. No results for input(s): LIPASE, AMYLASE in the last 168 hours. No results for input(s): AMMONIA in the last 168 hours.  CBC: Recent Labs  Lab 11/11/17 0616 11/11/17 1503 11/13/17 0549  WBC  2.7* 4.7 5.3  HGB 8.6* 10.2* 10.4*  HCT 27.4* 33.5* 34.1*  MCV 95.1 83.5 82.4  PLT 59* 295 270    Cardiac Enzymes: No results for input(s): CKTOTAL, CKMB, CKMBINDEX, TROPONINI in the last 168 hours.  BNP (last 3 results) No results for input(s): BNP in the last 8760 hours.  ProBNP (last 3 results) No results for input(s): PROBNP in the last 8760 hours.  Radiological Exams: No results found.  Assessment/Plan Principal Problem:   Acute on chronic respiratory failure with hypoxia (HCC) Active Problems:   Disorders of diaphragm   Atrial fibrillation (HCC)   Incomplete quadriplegia at C5-6 level (HCC)   History of stroke   1. Acute on chronic respiratory failure with hypoxia patient did 20 hours as a goal we will see how she does on the pressure support.  Toilet supportive care. 2. Disorders of diaphragm with diaphragmatic paralysis continue with supportive care 3. C5-6 quadriplegia incomplete continue present management rehab supportive care 4. Chronic atrial fibrillation rate is controlled 5. History of stroke stable at this time   I have personally seen and  evaluated the patient, evaluated laboratory and imaging results, formulated the assessment and plan and placed orders. The Patient requires high complexity decision making for assessment and support.  Case was discussed on Rounds with the Respiratory Therapy Staff  Yevonne Pax, MD [facility] Pulmonary Critical Care Medicine Sleep Medicine

## 2017-11-15 LAB — BASIC METABOLIC PANEL
Anion gap: 11 (ref 5–15)
BUN: <5 mg/dL — ABNORMAL LOW (ref 8–23)
CHLORIDE: 104 mmol/L (ref 98–111)
CO2: 23 mmol/L (ref 22–32)
Calcium: 7.9 mg/dL — ABNORMAL LOW (ref 8.9–10.3)
Creatinine, Ser: 0.38 mg/dL — ABNORMAL LOW (ref 0.44–1.00)
GFR calc Af Amer: >60 mL/min (ref >60)
GFR calc non Af Amer: >60 mL/min (ref >60)
GLUCOSE: 116 mg/dL — AB (ref 70–99)
Potassium: 3.4 mmol/L — ABNORMAL LOW (ref 3.5–5.1)
Sodium: 138 mmol/L (ref 135–145)

## 2017-11-15 LAB — CBC
HCT: 33.3 % — ABNORMAL LOW (ref 36.0–46.0)
HEMOGLOBIN: 10.2 g/dL — AB (ref 12.0–15.0)
MCH: 25 pg — AB (ref 26.0–34.0)
MCHC: 30.6 g/dL (ref 30.0–36.0)
MCV: 81.6 fL (ref 78.0–100.0)
Platelets: 247 10*3/uL (ref 150–400)
RBC: 4.08 MIL/uL (ref 3.87–5.11)
RDW: 18.9 % — ABNORMAL HIGH (ref 11.5–15.5)
WBC: 7.4 10*3/uL (ref 4.0–10.5)

## 2017-11-15 LAB — PHOSPHORUS: Phosphorus: 1.4 mg/dL — ABNORMAL LOW (ref 2.5–4.6)

## 2017-11-15 LAB — MAGNESIUM: Magnesium: 1.6 mg/dL — ABNORMAL LOW (ref 1.7–2.4)

## 2017-11-15 NOTE — Progress Notes (Signed)
Pulmonary Critical Care Medicine [facility] [place]   PULMONARY SERVICE  PROGRESS NOTE  Date of Service: 11/15/2017  Lindsay Mills  JYN:[id number]  DOB: [birth year]   DOA: 09/18/2017  Referring Physician: Carron Curie, MD  HPI: Lindsay Mills is a 72 y.o. female seen for follow up of Acute on Chronic Respiratory Failure.  Patient was able to do about 8.5 hours of pressure support yesterday pain is back on the wean for pressure support  Medications: Reviewed on Rounds  Physical Exam:  Vitals: Temperature 97.2 pulse 102 respiratory 18 blood pressure 154/88 saturations 98%  Ventilator Settings mode of ventilation pressure support FiO2 28% tidal volume is 350 pressure support 16 PEEP 5  . General: Comfortable at this time . Eyes: Grossly normal lids, irises & conjunctiva . ENT: grossly tongue is normal . Neck: no obvious mass . Cardiovascular: S1 S2 normal no gallop . Respiratory: No rhonchi or rales are noted . Abdomen: soft . Skin: no rash seen on limited exam . Musculoskeletal: not rigid . Psychiatric:unable to assess . Neurologic: no seizure no involuntary movements         Lab Data:   Basic Metabolic Panel: Recent Labs  Lab 11/10/17 0636 11/11/17 0616 11/11/17 1503 11/13/17 0549 11/14/17 0616 11/15/17 0458  NA 136  --   --  139 137 138  K 3.7  --   --  2.8* 4.9 3.4*  CL 105  --   --  104 106 104  CO2 22  --   --  23 18* 23  GLUCOSE 87  --   --  91 95 116*  BUN 7*  --   --  <5* 5* <5*  CREATININE 0.33*  --   --  0.39* 0.38* 0.38*  CALCIUM 8.1*  --   --  8.4* 8.0* 7.9*  MG 1.8  --  1.8 1.7 1.8 1.6*  PHOS 1.8* 5.0*  --  1.5* 2.0* 1.4*    Liver Function Tests: No results for input(s): AST, ALT, ALKPHOS, BILITOT, PROT, ALBUMIN in the last 168 hours. No results for input(s): LIPASE, AMYLASE in the last 168 hours. No results for input(s): AMMONIA in the last 168 hours.  CBC: Recent Labs  Lab 11/11/17 0616 11/11/17 1503 11/13/17 0549  11/15/17 0458  WBC 2.7* 4.7 5.3 7.4  HGB 8.6* 10.2* 10.4* 10.2*  HCT 27.4* 33.5* 34.1* 33.3*  MCV 95.1 83.5 82.4 81.6  PLT 59* 295 270 247    Cardiac Enzymes: No results for input(s): CKTOTAL, CKMB, CKMBINDEX, TROPONINI in the last 168 hours.  BNP (last 3 results) No results for input(s): BNP in the last 8760 hours.  ProBNP (last 3 results) No results for input(s): PROBNP in the last 8760 hours.  Radiological Exams: No results found.  Assessment/Plan Principal Problem:   Acute on chronic respiratory failure with hypoxia (HCC) Active Problems:   Disorders of diaphragm   Atrial fibrillation (HCC)   Incomplete quadriplegia at C5-6 level (HCC)   History of stroke   1. Acute on chronic respiratory failure with hypoxia we will continue weaning on pressure support she had previously done 16 hours we will try to advance further than 16 hours if able to tolerate. 2. Diaphragmatic paralysis at baseline continue with supportive care 3. Incomplete quadriplegia C5-6 continue with physical therapy as tolerated 4. Atrial fibrillation rate is controlled we will continue supportive care 5. History of stroke at baseline continue with therapy   I have personally seen and evaluated the patient,  evaluated laboratory and imaging results, formulated the assessment and plan and placed orders. The Patient requires high complexity decision making for assessment and support.  Case was discussed on Rounds with the Respiratory Therapy Staff  Yevonne Pax, MD [facility] Pulmonary Critical Care Medicine Sleep Medicine

## 2017-11-16 LAB — BASIC METABOLIC PANEL
ANION GAP: 12 (ref 5–15)
BUN: <5 mg/dL — ABNORMAL LOW (ref 8–23)
CHLORIDE: 107 mmol/L (ref 98–111)
CO2: 24 mmol/L (ref 22–32)
Calcium: 8.2 mg/dL — ABNORMAL LOW (ref 8.9–10.3)
Creatinine, Ser: 0.33 mg/dL — ABNORMAL LOW (ref 0.44–1.00)
GFR calc Af Amer: >60 mL/min (ref >60)
GFR calc non Af Amer: >60 mL/min (ref >60)
GLUCOSE: 101 mg/dL — AB (ref 70–99)
POTASSIUM: 4.1 mmol/L (ref 3.5–5.1)
Sodium: 143 mmol/L (ref 135–145)

## 2017-11-16 LAB — PHOSPHORUS: Phosphorus: 1.8 mg/dL — ABNORMAL LOW (ref 2.5–4.6)

## 2017-11-16 LAB — TRIGLYCERIDES: TRIGLYCERIDES: 37 mg/dL (ref <150)

## 2017-11-16 LAB — MAGNESIUM: Magnesium: 1.9 mg/dL (ref 1.7–2.4)

## 2017-11-16 NOTE — Progress Notes (Signed)
Pulmonary Critical Care Medicine [facility] [place]   PULMONARY SERVICE  PROGRESS NOTE  Date of Service: 11/16/2017  Lindsay Mills  [id number]  DOB: [birth year]   DOA: 09/18/2017  Referring Physician: Carron Curie, MD  HPI: Lindsay Mills is a 72 y.o. female seen for follow up of Acute on Chronic Respiratory Failure.  Patient is on pressure support mode right now currently on 28% oxygen.  The goal is for 20 hours so far she is tolerating it well  Medications: Reviewed on Rounds  Physical Exam:  Vitals: Temperature 97.6 pulse 84 respiratory rate 15 blood pressure 158/91 saturations 100%  Ventilator Settings mode of ventilation pressure support FiO2 20% tidal volume 348 pressure support 16 PEEP 5  . General: Comfortable at this time . Eyes: Grossly normal lids, irises & conjunctiva . ENT: grossly tongue is normal . Neck: no obvious mass . Cardiovascular: S1 S2 normal no gallop . Respiratory: Coarse breath sounds no rhonchi or rales . Abdomen: soft . Skin: no rash seen on limited exam . Musculoskeletal: not rigid . Psychiatric:unable to assess . Neurologic: no seizure no involuntary movements         Lab Data:   Basic Metabolic Panel: Recent Labs  Lab 11/10/17 0636 11/11/17 0616 11/11/17 1503 11/13/17 0549 11/14/17 0616 11/15/17 0458 11/16/17 0647  NA 136  --   --  139 137 138 143  K 3.7  --   --  2.8* 4.9 3.4* 4.1  CL 105  --   --  104 106 104 107  CO2 22  --   --  23 18* 23 24  GLUCOSE 87  --   --  91 95 116* 101*  BUN 7*  --   --  <5* 5* <5* <5*  CREATININE 0.33*  --   --  0.39* 0.38* 0.38* 0.33*  CALCIUM 8.1*  --   --  8.4* 8.0* 7.9* 8.2*  MG 1.8  --  1.8 1.7 1.8 1.6* 1.9  PHOS 1.8* 5.0*  --  1.5* 2.0* 1.4* 1.8*    Liver Function Tests: No results for input(s): AST, ALT, ALKPHOS, BILITOT, PROT, ALBUMIN in the last 168 hours. No results for input(s): LIPASE, AMYLASE in the last 168 hours. No results for input(s): AMMONIA in the last  168 hours.  CBC: Recent Labs  Lab 11/11/17 0616 11/11/17 1503 11/13/17 0549 11/15/17 0458  WBC 2.7* 4.7 5.3 7.4  HGB 8.6* 10.2* 10.4* 10.2*  HCT 27.4* 33.5* 34.1* 33.3*  MCV 95.1 83.5 82.4 81.6  PLT 59* 295 270 247    Cardiac Enzymes: No results for input(s): CKTOTAL, CKMB, CKMBINDEX, TROPONINI in the last 168 hours.  BNP (last 3 results) No results for input(s): BNP in the last 8760 hours.  ProBNP (last 3 results) No results for input(s): PROBNP in the last 8760 hours.  Radiological Exams: No results found.  Assessment/Plan Principal Problem:   Acute on chronic respiratory failure with hypoxia (HCC) Active Problems:   Disorders of diaphragm   Atrial fibrillation (HCC)   Incomplete quadriplegia at C5-6 level (HCC)   History of stroke   1. Acute on chronic respiratory failure with hypoxia we will continue with weaning 20-hour goal on pressure support 2. Diaphragmatic paralysis stable we will continue to monitor 3. Chronic atrial fibrillation rate is controlled we will monitor 4. Incomplete quadriplegia C5-6 physical therapy as tolerated 5. Stroke physical therapy as tolerated   I have personally seen and evaluated the patient, evaluated laboratory and imaging  results, formulated the assessment and plan and placed orders. The Patient requires high complexity decision making for assessment and support.  Case was discussed on Rounds with the Respiratory Therapy Staff  Yevonne Pax, MD [facility] Pulmonary Critical Care Medicine Sleep Medicine

## 2017-11-17 NOTE — Progress Notes (Signed)
Pulmonary Critical Care Medicine [facility] [place]   PULMONARY SERVICE  PROGRESS NOTE  Date of Service: 11/17/2017  CHARDAE BUCZEK  [id number]  DOB: 01/20/1946   DOA: 09/18/2017  Referring Physician: Carron Curie, MD  HPI: Lindsay Mills is a 72 y.o. female seen for follow up of Acute on Chronic Respiratory Failure.  Patient is currently on full support on pressure support mode has been on 28% FiO2 good saturations are noted  Medications: Reviewed on Rounds  Physical Exam:  Vitals: Temperature 98.2 pulse 112 respiratory 22 blood pressure 128/75 saturations 98%  Ventilator Settings off on pressure support FiO2 20% tidal volume 307 PEEP 5 pressure support 12  . General: Comfortable at this time . Eyes: Grossly normal lids, irises & conjunctiva . ENT: grossly tongue is normal . Neck: no obvious mass . Cardiovascular: S1 S2 normal no gallop . Respiratory: No rhonchi or rales are noted . Abdomen: soft . Skin: no rash seen on limited exam . Musculoskeletal: not rigid . Psychiatric:unable to assess . Neurologic: no seizure no involuntary movements         Lab Data:   Basic Metabolic Panel: Recent Labs  Lab 11/11/17 0616 11/11/17 1503 11/13/17 0549 11/14/17 0616 11/15/17 0458 11/16/17 0647  NA  --   --  139 137 138 143  K  --   --  2.8* 4.9 3.4* 4.1  CL  --   --  104 106 104 107  CO2  --   --  23 18* 23 24  GLUCOSE  --   --  91 95 116* 101*  BUN  --   --  <5* 5* <5* <5*  CREATININE  --   --  0.39* 0.38* 0.38* 0.33*  CALCIUM  --   --  8.4* 8.0* 7.9* 8.2*  MG  --  1.8 1.7 1.8 1.6* 1.9  PHOS 5.0*  --  1.5* 2.0* 1.4* 1.8*    Liver Function Tests: No results for input(s): AST, ALT, ALKPHOS, BILITOT, PROT, ALBUMIN in the last 168 hours. No results for input(s): LIPASE, AMYLASE in the last 168 hours. No results for input(s): AMMONIA in the last 168 hours.  CBC: Recent Labs  Lab 11/11/17 0616 11/11/17 1503 11/13/17 0549 11/15/17 0458  WBC  2.7* 4.7 5.3 7.4  HGB 8.6* 10.2* 10.4* 10.2*  HCT 27.4* 33.5* 34.1* 33.3*  MCV 95.1 83.5 82.4 81.6  PLT 59* 295 270 247    Cardiac Enzymes: No results for input(s): CKTOTAL, CKMB, CKMBINDEX, TROPONINI in the last 168 hours.  BNP (last 3 results) No results for input(s): BNP in the last 8760 hours.  ProBNP (last 3 results) No results for input(s): PROBNP in the last 8760 hours.  Radiological Exams: No results found.  Assessment/Plan Principal Problem:   Acute on chronic respiratory failure with hypoxia (HCC) Active Problems:   Disorders of diaphragm   Atrial fibrillation (HCC)   Incomplete quadriplegia at C5-6 level (HCC)   History of stroke   1. Acute on chronic respiratory failure with hypoxia she continues to tolerate pressure support on the lower pressure support level.  We will continue to advance as tolerated 2. Diaphragmatic paralysis clinically stable 3. Incomplete quadriplegia physical therapy as tolerated 4. Atrial fibrillation rate is controlled 5. History of stroke at baseline   I have personally seen and evaluated the patient, evaluated laboratory and imaging results, formulated the assessment and plan and placed orders. The Patient requires high complexity decision making for assessment and support.  Case was discussed on Rounds with the Respiratory Therapy Staff  Yevonne Pax, MD [facility] Pulmonary Critical Care Medicine Sleep Medicine

## 2017-11-18 LAB — BASIC METABOLIC PANEL
ANION GAP: 8 (ref 5–15)
BUN: 11 mg/dL (ref 8–23)
CALCIUM: 8.1 mg/dL — AB (ref 8.9–10.3)
CO2: 30 mmol/L (ref 22–32)
Chloride: 102 mmol/L (ref 98–111)
Creatinine, Ser: 0.31 mg/dL — ABNORMAL LOW (ref 0.44–1.00)
Glucose, Bld: 98 mg/dL (ref 70–99)
Potassium: 3.2 mmol/L — ABNORMAL LOW (ref 3.5–5.1)
Sodium: 140 mmol/L (ref 135–145)

## 2017-11-18 LAB — CBC
HCT: 36.2 % (ref 36.0–46.0)
HEMOGLOBIN: 10.8 g/dL — AB (ref 12.0–15.0)
MCH: 24.7 pg — AB (ref 26.0–34.0)
MCHC: 29.8 g/dL — ABNORMAL LOW (ref 30.0–36.0)
MCV: 82.6 fL (ref 78.0–100.0)
Platelets: 188 10*3/uL (ref 150–400)
RBC: 4.38 MIL/uL (ref 3.87–5.11)
RDW: 19.5 % — ABNORMAL HIGH (ref 11.5–15.5)
WBC: 5.7 10*3/uL (ref 4.0–10.5)

## 2017-11-18 LAB — PHOSPHORUS: Phosphorus: 1.5 mg/dL — ABNORMAL LOW (ref 2.5–4.6)

## 2017-11-18 LAB — MAGNESIUM: MAGNESIUM: 1.7 mg/dL (ref 1.7–2.4)

## 2017-11-18 NOTE — Progress Notes (Signed)
Pulmonary [place]   PULMONARY SERVICE  PROGRESS NOTE  Date of Service: 11/18/2017  Lindsay Mills  [id number]  DOB: 1946/04/10   DOA: 09/18/2017  Referring Physician: Merton Border, MD  HPI: Lindsay Mills is a 72 y.o. female seen for follow up of Acute on Chronic Respiratory Failure.  Patient is on pressure support mode try to decrease her pressure support level today 14.9 seems to be comfortable volumes are borderline  Medications: Reviewed on Rounds  Physical Exam:  Vitals: Temperature 97.6 pulse 84 respiratory rate 18 blood pressure 157/100 saturations 98%  Ventilator Settings mode of ventilation pressure support FiO2 28% tidal volume 306 pressure support 14 PEEP 5  . General: Comfortable at this time . Eyes: Grossly normal lids, irises & conjunctiva . ENT: grossly tongue is normal . Neck: no obvious mass . Cardiovascular: S1 S2 normal no gallop . Respiratory: No rhonchi or rales are noted . Abdomen: soft . Skin: no rash seen on limited exam . Musculoskeletal: not rigid . Psychiatric:unable to assess . Neurologic: no seizure no involuntary movements         Lab Data:   Basic Metabolic Panel: Recent Labs  Lab 11/13/17 0549 11/14/17 0616 11/15/17 0458 11/16/17 0647 11/18/17 0700  NA 139 137 138 143 140  K 2.8* 4.9 3.4* 4.1 3.2*  CL 104 106 104 107 102  CO2 23 18* 23 24 30   GLUCOSE 91 95 116* 101* 98  BUN <5* 5* <5* <5* 11  CREATININE 0.39* 0.38* 0.38* 0.33* 0.31*  CALCIUM 8.4* 8.0* 7.9* 8.2* 8.1*  MG 1.7 1.8 1.6* 1.9 1.7  PHOS 1.5* 2.0* 1.4* 1.8* 1.5*    Liver Function Tests: No results for input(s): AST, ALT, ALKPHOS, BILITOT, PROT, ALBUMIN in the last 168 hours. No results for input(s): LIPASE, AMYLASE in the last 168 hours. No results for input(s): AMMONIA in the last 168 hours.  CBC: Recent Labs  Lab 11/11/17 1503 11/13/17 0549 11/15/17 0458 11/18/17 0700  WBC 4.7 5.3 7.4 5.7  HGB 10.2*  10.4* 10.2* 10.8*  HCT 33.5* 34.1* 33.3* 36.2  MCV 83.5 82.4 81.6 82.6  PLT 295 270 247 188    Cardiac Enzymes: No results for input(s): CKTOTAL, CKMB, CKMBINDEX, TROPONINI in the last 168 hours.  BNP (last 3 results) No results for input(s): BNP in the last 8760 hours.  ProBNP (last 3 results) No results for input(s): PROBNP in the last 8760 hours.  Radiological Exams: No results found.  Assessment/Plan Principal Problem:   Acute on chronic respiratory failure with hypoxia (HCC) Active Problems:   Disorders of diaphragm   Atrial fibrillation (HCC)   Incomplete quadriplegia at C5-6 level (HCC)   History of stroke   1. Acute on chronic respiratory failure with hypoxia continue with weaning on pressure support as tolerated continue secretion management pulmonary toilet prognosis guarded. 2. Diaphragmatic paralysis continue present supportive care 3. Chronic atrial fibrillation rate is controlled 4. Incomplete quadriplegia treated continue with physical therapy prognosis 5. History of stroke at baseline we will monitor   I have personally seen and evaluated the patient, evaluated laboratory and imaging results, formulated the assessment and plan and placed orders. The Patient requires high complexity decision making for assessment and support.  Case was discussed on Rounds with the Respiratory Therapy Staff  Allyne Gee, MD [facility] Pulmonary Critical Care Medicine Sleep Medicine

## 2017-11-19 LAB — BLOOD GAS, ARTERIAL
ACID-BASE EXCESS: 10.3 mmol/L — AB (ref 0.0–2.0)
BICARBONATE: 35.1 mmol/L — AB (ref 20.0–28.0)
FIO2: 28
O2 Saturation: 98 %
PATIENT TEMPERATURE: 98.6
PCO2 ART: 53.6 mmHg — AB (ref 32.0–48.0)
PEEP: 5 cmH2O
PO2 ART: 107 mmHg (ref 83.0–108.0)
Pressure support: 14 cmH2O
pH, Arterial: 7.431 (ref 7.350–7.450)

## 2017-11-19 LAB — BASIC METABOLIC PANEL
Anion gap: 9 (ref 5–15)
BUN: 11 mg/dL (ref 8–23)
CO2: 31 mmol/L (ref 22–32)
Calcium: 8.1 mg/dL — ABNORMAL LOW (ref 8.9–10.3)
Chloride: 100 mmol/L (ref 98–111)
GLUCOSE: 114 mg/dL — AB (ref 70–99)
Potassium: 3.8 mmol/L (ref 3.5–5.1)
SODIUM: 140 mmol/L (ref 135–145)

## 2017-11-19 LAB — TRIGLYCERIDES: Triglycerides: 77 mg/dL (ref <150)

## 2017-11-19 LAB — MAGNESIUM: MAGNESIUM: 2 mg/dL (ref 1.7–2.4)

## 2017-11-19 LAB — PHOSPHORUS: Phosphorus: 1.9 mg/dL — ABNORMAL LOW (ref 2.5–4.6)

## 2017-11-19 NOTE — Progress Notes (Signed)
Pulmonary [place]   PULMONARY SERVICE  PROGRESS NOTE  Date of Service: 11/19/2017  Lindsay Mills  NID:[id number]  DOB: 24-Feb-1946   DOA: 09/18/2017  Referring Physician: Merton Border, MD  HPI: Lindsay Mills is a 72 y.o. female seen for follow up of Acute on Chronic Respiratory Failure.  Patient is on pressure support mode at this time currently on 28% FiO2 with a pressure support of 12/5  Medications: Reviewed on Rounds  Physical Exam:  Vitals: Temperature 97.4 pulse 84 respiratory rate 17 blood pressure 145/76 saturations 97%  Ventilator Settings mode of ventilation pressure support FiO2 20% tidal volume 300 pressure support 12 PEEP 5  . General: Comfortable at this time . Eyes: Grossly normal lids, irises & conjunctiva . ENT: grossly tongue is normal . Neck: no obvious mass . Cardiovascular: S1 S2 normal no gallop . Respiratory: Coarse breath sounds no rhonchi are noted . Abdomen: soft . Skin: no rash seen on limited exam . Musculoskeletal: not rigid . Psychiatric:unable to assess . Neurologic: no seizure no involuntary movements         Lab Data:   Basic Metabolic Panel: Recent Labs  Lab 11/14/17 0616 11/15/17 0458 11/16/17 0647 11/18/17 0700 11/19/17 0436  NA 137 138 143 140 140  K 4.9 3.4* 4.1 3.2* 3.8  CL 106 104 107 102 100  CO2 18* 23 24 30 31   GLUCOSE 95 116* 101* 98 114*  BUN 5* <5* <5* 11 11  CREATININE 0.38* 0.38* 0.33* 0.31* <0.30*  CALCIUM 8.0* 7.9* 8.2* 8.1* 8.1*  MG 1.8 1.6* 1.9 1.7 2.0  PHOS 2.0* 1.4* 1.8* 1.5* 1.9*    Liver Function Tests: No results for input(s): AST, ALT, ALKPHOS, BILITOT, PROT, ALBUMIN in the last 168 hours. No results for input(s): LIPASE, AMYLASE in the last 168 hours. No results for input(s): AMMONIA in the last 168 hours.  CBC: Recent Labs  Lab 11/13/17 0549 11/15/17 0458 11/18/17 0700  WBC 5.3 7.4 5.7  HGB 10.4* 10.2* 10.8*  HCT 34.1* 33.3* 36.2  MCV  82.4 81.6 82.6  PLT 270 247 188    Cardiac Enzymes: No results for input(s): CKTOTAL, CKMB, CKMBINDEX, TROPONINI in the last 168 hours.  BNP (last 3 results) No results for input(s): BNP in the last 8760 hours.  ProBNP (last 3 results) No results for input(s): PROBNP in the last 8760 hours.  Radiological Exams: No results found.  Assessment/Plan Principal Problem:   Acute on chronic respiratory failure with hypoxia (HCC) Active Problems:   Disorders of diaphragm   Atrial fibrillation (HCC)   Incomplete quadriplegia at C5-6 level (HCC)   History of stroke   1. Acute on chronic respiratory failure with hypoxia she is actually doing well weaning.  We will continue to advance the wean on pressure support as tolerated.  Continue pulmonary toilet secretion management. 2. History of stroke at baseline we will continue with restorative therapy. 3. Diaphragmatic paralysis at baseline we will continue to follow 4. Atrial fibrillation rate is controlled 5. Incomplete quadriplegia continue with physical therapy restorative therapy as tolerated.   I have personally seen and evaluated the patient, evaluated laboratory and imaging results, formulated the assessment and plan and placed orders. The Patient requires high complexity decision making for assessment and support.  Case was discussed on Rounds with the Respiratory Therapy Staff  Allyne Gee, MD [facility] Pulmonary Critical Care Medicine Sleep Medicine

## 2017-11-20 LAB — C DIFFICILE QUICK SCREEN W PCR REFLEX
C Diff antigen: NEGATIVE
C Diff interpretation: NOT DETECTED
C Diff toxin: NEGATIVE

## 2017-11-20 NOTE — Progress Notes (Signed)
Pulmonary [name]   PULMONARY SERVICE  PROGRESS NOTE  Date of Service: 11/20/2017  SHEALA [name]  [id number]  DOB: December 24, 1945   DOA: 09/18/2017  Referring Physician: Merton Border, MD  HPI: Lindsay Mills is a 72 y.o. female seen for follow up of Acute on Chronic Respiratory Failure.  She is comfortable was attempted on pressure support overnight had increased heart rate so therefore was stopped.  We are going to reassess again today  Medications: Reviewed on Rounds  Physical Exam:  Vitals: Temperature 98.7 pulse 119 respiratory rate 36 blood pressure 93/61 saturations 96%  Ventilator Settings mode of ventilation pressure assist control FiO2 20% tidal volume 363 PEEP 5  . General: Comfortable at this time . Eyes: Grossly normal lids, irises & conjunctiva . ENT: grossly tongue is normal . Neck: no obvious mass . Cardiovascular: S1 S2 normal no gallop . Respiratory: No rhonchi or rales are noted . Abdomen: soft . Skin: no rash seen on limited exam . Musculoskeletal: not rigid . Psychiatric:unable to assess . Neurologic: no seizure no involuntary movements         Lab Data:   Basic Metabolic Panel: Recent Labs  Lab 11/14/17 0616 11/15/17 0458 11/16/17 0647 11/18/17 0700 11/19/17 0436  NA 137 138 143 140 140  K 4.9 3.4* 4.1 3.2* 3.8  CL 106 104 107 102 100  CO2 18* 23 24 30 31   GLUCOSE 95 116* 101* 98 114*  BUN 5* <5* <5* 11 11  CREATININE 0.38* 0.38* 0.33* 0.31* <0.30*  CALCIUM 8.0* 7.9* 8.2* 8.1* 8.1*  MG 1.8 1.6* 1.9 1.7 2.0  PHOS 2.0* 1.4* 1.8* 1.5* 1.9*    Liver Function Tests: No results for input(s): AST, ALT, ALKPHOS, BILITOT, PROT, ALBUMIN in the last 168 hours. No results for input(s): LIPASE, AMYLASE in the last 168 hours. No results for input(s): AMMONIA in the last 168 hours.  CBC: Recent Labs  Lab 11/15/17 0458 11/18/17 0700  WBC 7.4 5.7  HGB 10.2* 10.8*  HCT 33.3* 36.2  MCV 81.6  82.6  PLT 247 188    Cardiac Enzymes: No results for input(s): CKTOTAL, CKMB, CKMBINDEX, TROPONINI in the last 168 hours.  BNP (last 3 results) No results for input(s): BNP in the last 8760 hours.  ProBNP (last 3 results) No results for input(s): PROBNP in the last 8760 hours.  Radiological Exams: No results found.  Assessment/Plan Principal Problem:   Acute on chronic respiratory failure with hypoxia (HCC) Active Problems:   Disorders of diaphragm   Atrial fibrillation (HCC)   Incomplete quadriplegia at C5-6 level (HCC)   History of stroke   1. Acute on chronic respiratory failure with hypoxia we will continue with assessing the wean potential start on pressure support again today 2. Diaphragmatic paralysis at baseline continue supportive care 3. Incomplete quadriplegia restorative therapy 4. History of stroke physical therapy as tolerated 5. Chronic atrial fibrillation rate is controlled we will monitor for   I have personally seen and evaluated the patient, evaluated laboratory and imaging results, formulated the assessment and plan and placed orders. The Patient requires high complexity decision making for assessment and support.  Case was discussed on Rounds with the Respiratory Therapy Staff  Allyne Gee, MD [facility] Pulmonary Critical Care Medicine Sleep Medicine

## 2017-11-21 LAB — BASIC METABOLIC PANEL
Anion gap: 9 (ref 5–15)
BUN: 15 mg/dL (ref 8–23)
CHLORIDE: 99 mmol/L (ref 98–111)
CO2: 30 mmol/L (ref 22–32)
CREATININE: 0.39 mg/dL — AB (ref 0.44–1.00)
Calcium: 8.8 mg/dL — ABNORMAL LOW (ref 8.9–10.3)
GFR calc Af Amer: >60 mL/min (ref >60)
GFR calc non Af Amer: >60 mL/min (ref >60)
GLUCOSE: 120 mg/dL — AB (ref 70–99)
Potassium: 4 mmol/L (ref 3.5–5.1)
SODIUM: 138 mmol/L (ref 135–145)

## 2017-11-21 LAB — CBC
HEMATOCRIT: 33.3 % — AB (ref 36.0–46.0)
HEMOGLOBIN: 10.4 g/dL — AB (ref 12.0–15.0)
MCH: 24.8 pg — ABNORMAL LOW (ref 26.0–34.0)
MCHC: 31.2 g/dL (ref 30.0–36.0)
MCV: 79.5 fL (ref 78.0–100.0)
Platelets: 195 10*3/uL (ref 150–400)
RBC: 4.19 MIL/uL (ref 3.87–5.11)
RDW: 20.2 % — ABNORMAL HIGH (ref 11.5–15.5)
WBC: 6.3 10*3/uL (ref 4.0–10.5)

## 2017-11-21 LAB — MAGNESIUM: Magnesium: 1.9 mg/dL (ref 1.7–2.4)

## 2017-11-21 LAB — PHOSPHORUS: Phosphorus: 2.6 mg/dL (ref 2.5–4.6)

## 2017-11-21 NOTE — Progress Notes (Signed)
Pulmonary Critical Care Medicine [facility] [place]   PULMONARY SERVICE  PROGRESS NOTE  Date of Service: 11/21/2017  Lindsay Mills  WUJ:[id number]  DOB: Apr 30, 1945   DOA: 09/18/2017  Referring Physician: Carron Curie, MD  HPI: Lindsay Mills is a 72 y.o. female seen for follow up of Acute on Chronic Respiratory Failure.  She is at baseline she was tried on pressure support 1 unit for pressure support level of 14 and she did not tolerate it.  Right now she is back on assist control mode  Medications: Reviewed on Rounds  Physical Exam:  Vitals: Temperature 98.2 pulse 94 respiratory rate 18 blood pressure 155/95 saturations 98%  Ventilator Settings mode of ventilation assist control FiO2 20% tidal volume 400 PEEP 5  . General: Comfortable at this time . Eyes: Grossly normal lids, irises & conjunctiva . ENT: grossly tongue is normal . Neck: no obvious mass . Cardiovascular: S1 S2 normal no gallop . Respiratory: No rhonchi or rales are noted . Abdomen: soft . Skin: no rash seen on limited exam . Musculoskeletal: not rigid . Psychiatric:unable to assess . Neurologic: no seizure no involuntary movements         Lab Data:   Basic Metabolic Panel: Recent Labs  Lab 11/15/17 0458 11/16/17 0647 11/18/17 0700 11/19/17 0436 11/21/17 0444  NA 138 143 140 140 138  K 3.4* 4.1 3.2* 3.8 4.0  CL 104 107 102 100 99  CO2 23 24 30 31 30   GLUCOSE 116* 101* 98 114* 120*  BUN <5* <5* 11 11 15   CREATININE 0.38* 0.33* 0.31* <0.30* 0.39*  CALCIUM 7.9* 8.2* 8.1* 8.1* 8.8*  MG 1.6* 1.9 1.7 2.0 1.9  PHOS 1.4* 1.8* 1.5* 1.9* 2.6    Liver Function Tests: No results for input(s): AST, ALT, ALKPHOS, BILITOT, PROT, ALBUMIN in the last 168 hours. No results for input(s): LIPASE, AMYLASE in the last 168 hours. No results for input(s): AMMONIA in the last 168 hours.  CBC: Recent Labs  Lab 11/15/17 0458 11/18/17 0700 11/21/17 0444  WBC 7.4 5.7 6.3  HGB 10.2* 10.8*  10.4*  HCT 33.3* 36.2 33.3*  MCV 81.6 82.6 79.5  PLT 247 188 195    Cardiac Enzymes: No results for input(s): CKTOTAL, CKMB, CKMBINDEX, TROPONINI in the last 168 hours.  BNP (last 3 results) No results for input(s): BNP in the last 8760 hours.  ProBNP (last 3 results) No results for input(s): PROBNP in the last 8760 hours.  Radiological Exams: No results found.  Assessment/Plan Principal Problem:   Acute on chronic respiratory failure with hypoxia (HCC) Active Problems:   Disorders of diaphragm   Atrial fibrillation (HCC)   Incomplete quadriplegia at C5-6 level (HCC)   History of stroke   1. Acute on chronic respiratory failure with hypoxia continue with full support for now.  Try to reassess the weaning place him on a pressure support of 16 and PEEP 5 to see if she is able to tolerate. 2. Diaphragmatic paralysis at baseline she failed today we will reassess 3. Chronic atrial fibrillation rate is controlled 4. Incomplete quadriplegia C5-6 continue present management 5. History of stroke at baseline we will continue to follow   I have personally seen and evaluated the patient, evaluated laboratory and imaging results, formulated the assessment and plan and placed orders. The Patient requires high complexity decision making for assessment and support.  Case was discussed on Rounds with the Respiratory Therapy Staff  Yevonne Pax, MD [facility] Pulmonary Critical  Care Medicine Sleep Medicine

## 2017-11-22 LAB — MAGNESIUM: Magnesium: 1.8 mg/dL (ref 1.7–2.4)

## 2017-11-22 LAB — BASIC METABOLIC PANEL
Anion gap: 8 (ref 5–15)
BUN: 14 mg/dL (ref 8–23)
CALCIUM: 8.7 mg/dL — AB (ref 8.9–10.3)
CO2: 30 mmol/L (ref 22–32)
CREATININE: 0.3 mg/dL — AB (ref 0.44–1.00)
Chloride: 99 mmol/L (ref 98–111)
GFR calc non Af Amer: >60 mL/min (ref >60)
Glucose, Bld: 107 mg/dL — ABNORMAL HIGH (ref 70–99)
Potassium: 4.3 mmol/L (ref 3.5–5.1)
SODIUM: 137 mmol/L (ref 135–145)

## 2017-11-22 LAB — PHOSPHORUS: PHOSPHORUS: 4.4 mg/dL (ref 2.5–4.6)

## 2017-11-22 LAB — TRIGLYCERIDES: Triglycerides: 68 mg/dL (ref <150)

## 2017-11-22 NOTE — Progress Notes (Signed)
Pulmonary [place]   PULMONARY SERVICE  PROGRESS NOTE  Date of Service: 11/22/2017  Lindsay Mills  [id number]  DOB: 1945/07/26   DOA: 09/18/2017  Referring Physician: Merton Border, MD  HPI: Lindsay Mills is a 72 y.o. female seen for follow up of Acute on Chronic Respiratory Failure.  Patient is on pressure support mode she is on a 16/5 pressure support at this time tolerating it well  Medications: Reviewed on Rounds  Physical Exam:  Vitals: Temperature 97.8 pulse 84 respiratory 14 blood pressure 128/83  Ventilator Settings mode of ventilation pressure support FiO2 28% tidal volume 340 pressure support 16/5  . General: Comfortable at this time . Eyes: Grossly normal lids, irises & conjunctiva . ENT: grossly tongue is normal . Neck: no obvious mass . Cardiovascular: S1 S2 normal no gallop . Respiratory: Coarse breath sounds no rhonchi . Abdomen: soft . Skin: no rash seen on limited exam . Musculoskeletal: not rigid . Psychiatric:unable to assess . Neurologic: no seizure no involuntary movements         Lab Data:   Basic Metabolic Panel: Recent Labs  Lab 11/16/17 0647 11/18/17 0700 11/19/17 0436 11/21/17 0444 11/22/17 0535  NA 143 140 140 138 137  K 4.1 3.2* 3.8 4.0 4.3  CL 107 102 100 99 99  CO2 24 30 31 30 30   GLUCOSE 101* 98 114* 120* 107*  BUN <5* 11 11 15 14   CREATININE 0.33* 0.31* <0.30* 0.39* 0.30*  CALCIUM 8.2* 8.1* 8.1* 8.8* 8.7*  MG 1.9 1.7 2.0 1.9 1.8  PHOS 1.8* 1.5* 1.9* 2.6 4.4    Liver Function Tests: No results for input(s): AST, ALT, ALKPHOS, BILITOT, PROT, ALBUMIN in the last 168 hours. No results for input(s): LIPASE, AMYLASE in the last 168 hours. No results for input(s): AMMONIA in the last 168 hours.  CBC: Recent Labs  Lab 11/18/17 0700 11/21/17 0444  WBC 5.7 6.3  HGB 10.8* 10.4*  HCT 36.2 33.3*  MCV 82.6 79.5  PLT 188 195    Cardiac Enzymes: No results for input(s):  CKTOTAL, CKMB, CKMBINDEX, TROPONINI in the last 168 hours.  BNP (last 3 results) No results for input(s): BNP in the last 8760 hours.  ProBNP (last 3 results) No results for input(s): PROBNP in the last 8760 hours.  Radiological Exams: No results found.  Assessment/Plan Principal Problem:   Acute on chronic respiratory failure with hypoxia (HCC) Active Problems:   Disorders of diaphragm   Atrial fibrillation (HCC)   Incomplete quadriplegia at C5-6 level (HCC)   History of stroke   1. Acute on chronic respiratory failure with hypoxia we will continue with the weaning on the pressure support she is only able to tolerate 16/5 2. Diaphragmatic paralysis at baseline continue present management. 3. Chronic atrial fibrillation rate is controlled we will monitor 4. C5-6 quadriplegia stable we will continue present therapy 5. History of stroke at baseline   I have personally seen and evaluated the patient, evaluated laboratory and imaging results, formulated the assessment and plan and placed orders. The Patient requires high complexity decision making for assessment and support.  Case was discussed on Rounds with the Respiratory Therapy Staff  Allyne Gee, MD [facility] Pulmonary Critical Care Medicine Sleep Medicine

## 2017-11-23 NOTE — Progress Notes (Signed)
Pulmonary [place]   PULMONARY SERVICE  PROGRESS NOTE  Date of Service: 11/23/2017  JAMETTA MOOREHEAD  [id number]:[id number]  DOB: 08-05-1945   DOA: 09/18/2017  Referring Physician: Merton Border, MD  HPI: Lindsay Mills is a 72 y.o. female seen for follow up of Acute on Chronic Respiratory Failure.  Patient remains comfortable without distress remains on pressure assist control mode on 35% this morning she was tried on a spontaneous breathing trial however failed  Medications: Reviewed on Rounds  Physical Exam:  Vitals: Temperature 97.5 pulse 108 respiratory rate 37 blood pressure 135/71 saturations 99%  Ventilator Settings mode of ventilation pressure assist control FiO2 35% tidal volume 30 PEEP 5  . General: Comfortable at this time . Eyes: Grossly normal lids, irises & conjunctiva . ENT: grossly tongue is normal . Neck: no obvious mass . Cardiovascular: S1 S2 normal no gallop . Respiratory: Coarse breath sounds noted bilaterally . Abdomen: soft . Skin: no rash seen on limited exam . Musculoskeletal: not rigid . Psychiatric:unable to assess . Neurologic: no seizure no involuntary movements         Lab Data:   Basic Metabolic Panel: Recent Labs  Lab 11/18/17 0700 11/19/17 0436 11/21/17 0444 11/22/17 0535  NA 140 140 138 137  K 3.2* 3.8 4.0 4.3  CL 102 100 99 99  CO2 30 31 30 30   GLUCOSE 98 114* 120* 107*  BUN 11 11 15 14   CREATININE 0.31* <0.30* 0.39* 0.30*  CALCIUM 8.1* 8.1* 8.8* 8.7*  MG 1.7 2.0 1.9 1.8  PHOS 1.5* 1.9* 2.6 4.4    Liver Function Tests: No results for input(s): AST, ALT, ALKPHOS, BILITOT, PROT, ALBUMIN in the last 168 hours. No results for input(s): LIPASE, AMYLASE in the last 168 hours. No results for input(s): AMMONIA in the last 168 hours.  CBC: Recent Labs  Lab 11/18/17 0700 11/21/17 0444  WBC 5.7 6.3  HGB 10.8* 10.4*  HCT 36.2 33.3*  MCV 82.6 79.5  PLT 188 195    Cardiac  Enzymes: No results for input(s): CKTOTAL, CKMB, CKMBINDEX, TROPONINI in the last 168 hours.  BNP (last 3 results) No results for input(s): BNP in the last 8760 hours.  ProBNP (last 3 results) No results for input(s): PROBNP in the last 8760 hours.  Radiological Exams: No results found.  Assessment/Plan Principal Problem:   Acute on chronic respiratory failure with hypoxia (HCC) Active Problems:   Disorders of diaphragm   Atrial fibrillation (HCC)   Incomplete quadriplegia at C5-6 level (HCC)   History of stroke   1. Acute on chronic respiratory failure with hypoxia we will continue with weaning on pressure support as tolerated today she did not have a good day we will have respiratory therapy reassess. 2. Diaphragmatic paralysis continue with supportive care 3. Incomplete quadriplegia supportive care restorative therapy 4. Atrial fibrillation rate is controlled 5. History of stroke continue with physical therapy as tolerated   I have personally seen and evaluated the patient, evaluated laboratory and imaging results, formulated the assessment and plan and placed orders. The Patient requires high complexity decision making for assessment and support.  Case was discussed on Rounds with the Respiratory Therapy Staff  Allyne Gee, MD [facility] Pulmonary Critical Care Medicine Sleep Medicine

## 2017-11-25 LAB — BASIC METABOLIC PANEL
Anion gap: 8 (ref 5–15)
BUN: 26 mg/dL — ABNORMAL HIGH (ref 8–23)
CHLORIDE: 96 mmol/L — AB (ref 98–111)
CO2: 34 mmol/L — AB (ref 22–32)
CREATININE: 0.39 mg/dL — AB (ref 0.44–1.00)
Calcium: 9.4 mg/dL (ref 8.9–10.3)
GFR calc Af Amer: >60 mL/min (ref >60)
GFR calc non Af Amer: >60 mL/min (ref >60)
Glucose, Bld: 123 mg/dL — ABNORMAL HIGH (ref 70–99)
Potassium: 4.3 mmol/L (ref 3.5–5.1)
Sodium: 138 mmol/L (ref 135–145)

## 2017-11-25 LAB — MAGNESIUM: Magnesium: 1.9 mg/dL (ref 1.7–2.4)

## 2017-11-25 LAB — TRIGLYCERIDES: Triglycerides: 121 mg/dL

## 2017-11-25 LAB — PHOSPHORUS: Phosphorus: 3.6 mg/dL (ref 2.5–4.6)

## 2017-11-25 NOTE — Progress Notes (Signed)
Pulmonary [place]   PULMONARY SERVICE  PROGRESS NOTE  Date of Service: 11/25/2017  ANNTONETTE Mills  [id number]  DOB: February 16, 1946   DOA: 09/18/2017  Referring Physician: Merton Border, MD  HPI: Lindsay Mills is a 72 y.o. female seen for follow up of Acute on Chronic Respiratory Failure.  Patient is on pressure support mode right now has been tolerating 28% FiO2.  On 16/5 pressure support  Medications: Reviewed on Rounds  Physical Exam:  Vitals: Temperature 97.3 pulse 94 respiratory rate 18 blood pressure 83/52 saturations 99%  Ventilator Settings mode of ventilation pressure support FiO2 28% pressure support 16 PEEP 5  . General: Comfortable at this time . Eyes: Grossly normal lids, irises & conjunctiva . ENT: grossly tongue is normal . Neck: no obvious mass . Cardiovascular: S1 S2 normal no gallop . Respiratory: No rhonchi or rales are noted . Abdomen: soft . Skin: no rash seen on limited exam . Musculoskeletal: not rigid . Psychiatric:unable to assess . Neurologic: no seizure no involuntary movements         Lab Data:   Basic Metabolic Panel: Recent Labs  Lab 11/19/17 0436 11/21/17 0444 11/22/17 0535 11/25/17 0535  NA 140 138 137 138  K 3.8 4.0 4.3 4.3  CL 100 99 99 96*  CO2 31 30 30  34*  GLUCOSE 114* 120* 107* 123*  BUN 11 15 14  26*  CREATININE <0.30* 0.39* 0.30* 0.39*  CALCIUM 8.1* 8.8* 8.7* 9.4  MG 2.0 1.9 1.8 1.9  PHOS 1.9* 2.6 4.4 3.6    Liver Function Tests: No results for input(s): AST, ALT, ALKPHOS, BILITOT, PROT, ALBUMIN in the last 168 hours. No results for input(s): LIPASE, AMYLASE in the last 168 hours. No results for input(s): AMMONIA in the last 168 hours.  CBC: Recent Labs  Lab 11/21/17 0444  WBC 6.3  HGB 10.4*  HCT 33.3*  MCV 79.5  PLT 195    Cardiac Enzymes: No results for input(s): CKTOTAL, CKMB, CKMBINDEX, TROPONINI in the last 168 hours.  BNP (last 3 results) No  results for input(s): BNP in the last 8760 hours.  ProBNP (last 3 results) No results for input(s): PROBNP in the last 8760 hours.  Radiological Exams: No results found.  Assessment/Plan Principal Problem:   Acute on chronic respiratory failure with hypoxia (HCC) Active Problems:   Disorders of diaphragm   Atrial fibrillation (HCC)   Incomplete quadriplegia at C5-6 level (HCC)   History of stroke   1. Acute on chronic respiratory failure with hypoxia patient continues to wean we will continue with pressure support mode the goal is for 12 hours today. 2. Diaphragmatic paralysis will need ongoing nocturnal vent support 3. Chronic atrial fibrillation rate is controlled 4. Incomplete quadriplegia C5-6 continue present therapy 5. History of stroke stable we will monitor   I have personally seen and evaluated the patient, evaluated laboratory and imaging results, formulated the assessment and plan and placed orders. The Patient requires high complexity decision making for assessment and support.  Case was discussed on Rounds with the Respiratory Therapy Staff  Allyne Gee, MD [facility] Pulmonary Critical Care Medicine Sleep Medicine

## 2017-11-26 LAB — CBC
HCT: 34.2 % — ABNORMAL LOW (ref 36.0–46.0)
Hemoglobin: 10.7 g/dL — ABNORMAL LOW (ref 12.0–15.0)
MCH: 25.1 pg — AB (ref 26.0–34.0)
MCHC: 31.3 g/dL (ref 30.0–36.0)
MCV: 80.3 fL (ref 78.0–100.0)
PLATELETS: 158 10*3/uL (ref 150–400)
RBC: 4.26 MIL/uL (ref 3.87–5.11)
RDW: 21.6 % — AB (ref 11.5–15.5)
WBC: 7.6 10*3/uL (ref 4.0–10.5)

## 2017-11-26 NOTE — Progress Notes (Signed)
Pulmonary [place]   PULMONARY SERVICE  PROGRESS NOTE  Date of Service: 11/26/2017  Lindsay Mills  [id number]  DOB: 11-Mar-1946   DOA: 09/18/2017  Referring Physician: Merton Border, MD  HPI: Lindsay Mills is a 72 y.o. female seen for follow up of Acute on Chronic Respiratory Failure.  She is back on her weaning on pressure support.  Medications: Reviewed on Rounds  Physical Exam:  Vitals: Temperature 97.6 pulse 95 respiratory 18 blood pressure 139/75 saturations 100%  Ventilator Settings mode of ventilation pressure support FiO2 35% tidal volume 327 PEEP 5 pressure support 16  . General: Comfortable at this time . Eyes: Grossly normal lids, irises & conjunctiva . ENT: grossly tongue is normal . Neck: no obvious mass . Cardiovascular: S1 S2 normal no gallop . Respiratory: Coarse breath sounds no rhonchi . Abdomen: soft . Skin: no rash seen on limited exam . Musculoskeletal: not rigid . Psychiatric:unable to assess . Neurologic: no seizure no involuntary movements         Lab Data:   Basic Metabolic Panel: Recent Labs  Lab 11/21/17 0444 11/22/17 0535 11/25/17 0535  NA 138 137 138  K 4.0 4.3 4.3  CL 99 99 96*  CO2 30 30 34*  GLUCOSE 120* 107* 123*  BUN 15 14 26*  CREATININE 0.39* 0.30* 0.39*  CALCIUM 8.8* 8.7* 9.4  MG 1.9 1.8 1.9  PHOS 2.6 4.4 3.6    Liver Function Tests: No results for input(s): AST, ALT, ALKPHOS, BILITOT, PROT, ALBUMIN in the last 168 hours. No results for input(s): LIPASE, AMYLASE in the last 168 hours. No results for input(s): AMMONIA in the last 168 hours.  CBC: Recent Labs  Lab 11/21/17 0444  WBC 6.3  HGB 10.4*  HCT 33.3*  MCV 79.5  PLT 195    Cardiac Enzymes: No results for input(s): CKTOTAL, CKMB, CKMBINDEX, TROPONINI in the last 168 hours.  BNP (last 3 results) No results for input(s): BNP in the last 8760 hours.  ProBNP (last 3 results) No results for  input(s): PROBNP in the last 8760 hours.  Radiological Exams: No results found.  Assessment/Plan Principal Problem:   Acute on chronic respiratory failure with hypoxia (HCC) Active Problems:   Disorders of diaphragm   Atrial fibrillation (HCC)   Incomplete quadriplegia at C5-6 level (HCC)   History of stroke   1. Acute on chronic respiratory failure with hypoxia we will continue with the weaning on pressure support she is not able to tolerate below a pressure support of 16 as demonstrated.  We will continue to assess 2. Diaphragmatic paralysis continue with supportive care 3. Chronic atrial fibrillation rate is controlled we will follow 4. Incomplete quadriplegia restorative therapy 5. Stroke restorative therapy   I have personally seen and evaluated the patient, evaluated laboratory and imaging results, formulated the assessment and plan and placed orders. The Patient requires high complexity decision making for assessment and support.  Case was discussed on Rounds with the Respiratory Therapy Staff  Allyne Gee, MD [facility] Pulmonary Critical Care Medicine Sleep Medicine

## 2017-11-27 NOTE — Progress Notes (Signed)
Pulmonary [place]   PULMONARY SERVICE  PROGRESS NOTE  Date of Service: 11/27/2017  Lindsay Mills  [id number]  DOB: 13-Jul-1945   DOA: 09/18/2017  Referring Physician: Merton Border, MD  HPI: Lindsay Mills is a 72 y.o. female seen for follow up of Acute on Chronic Respiratory Failure.  Patient is on pressure support mode currently is on 35% oxygen.  She seems to tolerate 16/5 fairly well  Medications: Reviewed on Rounds  Physical Exam:  Vitals: Temperature 97.1 pulse 75 respirate 18 blood pressure 160/64 saturations 98%  Ventilator Settings mode of ventilation pressure support FiO2 35% tidal volume 352 pressure 16 PEEP 5  . General: Comfortable at this time . Eyes: Grossly normal lids, irises & conjunctiva . ENT: grossly tongue is normal . Neck: no obvious mass . Cardiovascular: S1 S2 normal no gallop . Respiratory: No rhonchi are noted . Abdomen: soft . Skin: no rash seen on limited exam . Musculoskeletal: not rigid . Psychiatric:unable to assess . Neurologic: no seizure no involuntary movements         Lab Data:   Basic Metabolic Panel: Recent Labs  Lab 11/21/17 0444 11/22/17 0535 11/25/17 0535  NA 138 137 138  K 4.0 4.3 4.3  CL 99 99 96*  CO2 30 30 34*  GLUCOSE 120* 107* 123*  BUN 15 14 26*  CREATININE 0.39* 0.30* 0.39*  CALCIUM 8.8* 8.7* 9.4  MG 1.9 1.8 1.9  PHOS 2.6 4.4 3.6    Liver Function Tests: No results for input(s): AST, ALT, ALKPHOS, BILITOT, PROT, ALBUMIN in the last 168 hours. No results for input(s): LIPASE, AMYLASE in the last 168 hours. No results for input(s): AMMONIA in the last 168 hours.  CBC: Recent Labs  Lab 11/21/17 0444 11/26/17 0720  WBC 6.3 7.6  HGB 10.4* 10.7*  HCT 33.3* 34.2*  MCV 79.5 80.3  PLT 195 158    Cardiac Enzymes: No results for input(s): CKTOTAL, CKMB, CKMBINDEX, TROPONINI in the last 168 hours.  BNP (last 3 results) No results for input(s): BNP  in the last 8760 hours.  ProBNP (last 3 results) No results for input(s): PROBNP in the last 8760 hours.  Radiological Exams: No results found.  Assessment/Plan Principal Problem:   Acute on chronic respiratory failure with hypoxia (HCC) Active Problems:   Disorders of diaphragm   Atrial fibrillation (HCC)   Incomplete quadriplegia at C5-6 level (HCC)   History of stroke   1. Acute on chronic respiratory failure with hypoxia continue with the pressure support wean as tolerated continue secretion management pulmonary toilet. 2. Diaphragmatic paralysis at baseline we will continue to follow 3. Atrial fibrillation rate is controlled 4. Syncope quadriplegia C5-6 at baseline we will continue supportive care 5. History of stroke continue with supportive care restorative therapy   I have personally seen and evaluated the patient, evaluated laboratory and imaging results, formulated the assessment and plan and placed orders. The Patient requires high complexity decision making for assessment and support.  Case was discussed on Rounds with the Respiratory Therapy Staff  Allyne Gee, MD [facility] Pulmonary Critical Care Medicine Sleep Medicine

## 2017-11-28 LAB — CBC
HEMATOCRIT: 33.5 % — AB (ref 36.0–46.0)
HEMOGLOBIN: 10.4 g/dL — AB (ref 12.0–15.0)
MCH: 25 pg — ABNORMAL LOW (ref 26.0–34.0)
MCHC: 31 g/dL (ref 30.0–36.0)
MCV: 80.5 fL (ref 78.0–100.0)
Platelets: 190 10*3/uL (ref 150–400)
RBC: 4.16 MIL/uL (ref 3.87–5.11)
RDW: 21.5 % — AB (ref 11.5–15.5)
WBC: 5.9 10*3/uL (ref 4.0–10.5)

## 2017-11-28 LAB — BASIC METABOLIC PANEL
ANION GAP: 9 (ref 5–15)
BUN: 19 mg/dL (ref 8–23)
CHLORIDE: 100 mmol/L (ref 98–111)
CO2: 30 mmol/L (ref 22–32)
Calcium: 9.2 mg/dL (ref 8.9–10.3)
Creatinine, Ser: 0.36 mg/dL — ABNORMAL LOW (ref 0.44–1.00)
GFR calc non Af Amer: >60 mL/min (ref >60)
Glucose, Bld: 89 mg/dL (ref 70–99)
POTASSIUM: 4.1 mmol/L (ref 3.5–5.1)
Sodium: 139 mmol/L (ref 135–145)

## 2017-11-28 LAB — MAGNESIUM: Magnesium: 1.9 mg/dL (ref 1.7–2.4)

## 2017-11-28 LAB — PHOSPHORUS: PHOSPHORUS: 3.4 mg/dL (ref 2.5–4.6)

## 2017-11-28 NOTE — Progress Notes (Signed)
Pulmonary [place]   PULMONARY SERVICE  PROGRESS NOTE  Date of Service: 11/28/2017  Lindsay Mills  [id number]  DOB: Dec 03, 1945   DOA: 09/18/2017  Referring Physician: Merton Border, MD  HPI: Lindsay Mills is a 72 y.o. female seen for follow up of Acute on Chronic Respiratory Failure.  Patient is currently on pressure support has been on 20% FiO2 with a tidal volume of 380 currently is on pressure support of 16/5  Medications: Reviewed on Rounds  Physical Exam:  Vitals: Temperature 97.5 pulse 70 respiratory rate 20 blood pressure 123/74 saturations 100%  Ventilator Settings mode of ventilation pressure support FiO2 20% tidal volume 380 pressure support 16 PEEP 5  . General: Comfortable at this time . Eyes: Grossly normal lids, irises & conjunctiva . ENT: grossly tongue is normal . Neck: no obvious mass . Cardiovascular: S1 S2 normal no gallop . Respiratory: No rhonchi no rales are noted at this time . Abdomen: soft . Skin: no rash seen on limited exam . Musculoskeletal: not rigid . Psychiatric:unable to assess . Neurologic: no seizure no involuntary movements         Lab Data:   Basic Metabolic Panel: Recent Labs  Lab 11/22/17 0535 11/25/17 0535 11/28/17 0721  NA 137 138 139  K 4.3 4.3 4.1  CL 99 96* 100  CO2 30 34* 30  GLUCOSE 107* 123* 89  BUN 14 26* 19  CREATININE 0.30* 0.39* 0.36*  CALCIUM 8.7* 9.4 9.2  MG 1.8 1.9 1.9  PHOS 4.4 3.6 3.4    Liver Function Tests: No results for input(s): AST, ALT, ALKPHOS, BILITOT, PROT, ALBUMIN in the last 168 hours. No results for input(s): LIPASE, AMYLASE in the last 168 hours. No results for input(s): AMMONIA in the last 168 hours.  CBC: Recent Labs  Lab 11/26/17 0720 11/28/17 0721  WBC 7.6 5.9  HGB 10.7* 10.4*  HCT 34.2* 33.5*  MCV 80.3 80.5  PLT 158 190    Cardiac Enzymes: No results for input(s): CKTOTAL, CKMB, CKMBINDEX, TROPONINI in the last 168  hours.  BNP (last 3 results) No results for input(s): BNP in the last 8760 hours.  ProBNP (last 3 results) No results for input(s): PROBNP in the last 8760 hours.  Radiological Exams: No results found.  Assessment/Plan Principal Problem:   Acute on chronic respiratory failure with hypoxia (HCC) Active Problems:   Disorders of diaphragm   Atrial fibrillation (HCC)   Incomplete quadriplegia at C5-6 level (HCC)   History of stroke   1. Acute on chronic respiratory failure with hypoxia continue with weaning on pressure support she is able to tolerate 16/5 but not lower titrate oxygen as tolerated. 2. History of stroke at baseline continue restorative therapy 3. Diaphragmatic paralysis at baseline continue with present management prognosis guarded 4. Chronic atrial fibrillation rate is controlled we will continue to follow 5. Incomplete quadriplegia stable restorative therapy   I have personally seen and evaluated the patient, evaluated laboratory and imaging results, formulated the assessment and plan and placed orders. The Patient requires high complexity decision making for assessment and support.  Case was discussed on Rounds with the Respiratory Therapy Staff  Allyne Gee, MD [facility] Pulmonary Critical Care Medicine Sleep Medicine

## 2017-11-29 NOTE — Progress Notes (Signed)
Pulmonary [place]   PULMONARY SERVICE  PROGRESS NOTE  Date of Service: 11/29/2017  Lindsay Mills  [id number]  DOB: December 06, 1945   DOA: 09/18/2017  Referring Physician: Merton Border, MD  HPI: Lindsay Mills is a 72 y.o. female seen for follow up of Acute on Chronic Respiratory Failure.  Patient is right now on pressure support weaning has been on 16/5 seems to be tolerating it fairly well.  She is not able to tolerate lower pressure support levels however.  Medications: Reviewed on Rounds  Physical Exam:  Vitals: Temperature 98.2 pulse 85 respiratory 18 blood pressure 150/83 saturations 97%  Ventilator Settings currently is on pressure support FiO2 20% tidal volume 301 pressure support 12 PEEP 5  . General: Comfortable at this time . Eyes: Grossly normal lids, irises & conjunctiva . ENT: grossly tongue is normal . Neck: no obvious mass . Cardiovascular: S1 S2 normal no gallop . Respiratory: No rhonchi no rales are noted . Abdomen: soft . Skin: no rash seen on limited exam . Musculoskeletal: not rigid . Psychiatric:unable to assess . Neurologic: no seizure no involuntary movements         Lab Data:   Basic Metabolic Panel: Recent Labs  Lab 11/25/17 0535 11/28/17 0721  NA 138 139  K 4.3 4.1  CL 96* 100  CO2 34* 30  GLUCOSE 123* 89  BUN 26* 19  CREATININE 0.39* 0.36*  CALCIUM 9.4 9.2  MG 1.9 1.9  PHOS 3.6 3.4    Liver Function Tests: No results for input(s): AST, ALT, ALKPHOS, BILITOT, PROT, ALBUMIN in the last 168 hours. No results for input(s): LIPASE, AMYLASE in the last 168 hours. No results for input(s): AMMONIA in the last 168 hours.  CBC: Recent Labs  Lab 11/26/17 0720 11/28/17 0721  WBC 7.6 5.9  HGB 10.7* 10.4*  HCT 34.2* 33.5*  MCV 80.3 80.5  PLT 158 190    Cardiac Enzymes: No results for input(s): CKTOTAL, CKMB, CKMBINDEX, TROPONINI in the last 168 hours.  BNP (last 3 results) No  results for input(s): BNP in the last 8760 hours.  ProBNP (last 3 results) No results for input(s): PROBNP in the last 8760 hours.  Radiological Exams: No results found.  Assessment/Plan Principal Problem:   Acute on chronic respiratory failure with hypoxia (HCC) Active Problems:   Disorders of diaphragm   Atrial fibrillation (HCC)   Incomplete quadriplegia at C5-6 level (HCC)   History of stroke   1. Acute on chronic respiratory failure with hypoxia continue with pressure support weaning as tolerated.  Spoke with her husband present in the room he was updated. 2. Diaphragmatic paralysis limiting factor for extended weaning. 3. Chronic atrial fibrillation rate is controlled continue to follow 4. Incorporate quadriplegia C5-6 restorative therapy 5. History of stroke therapy restorative therapy   I have personally seen and evaluated the patient, evaluated laboratory and imaging results, formulated the assessment and plan and placed orders. The Patient requires high complexity decision making for assessment and support.  Case was discussed on Rounds with the Respiratory Therapy Staff  Allyne Gee, MD [facility] Pulmonary Critical Care Medicine Sleep Medicine

## 2017-11-30 NOTE — Progress Notes (Signed)
Pulmonary Critical Care Medicine [place]   PULMONARY CRITICAL CARE SERVICE  PROGRESS NOTE  Date of Service: 11/30/2017  AMIEE WILEY  YIA:[id number]  DOB: 05/06/1945   DOA: 09/18/2017  Referring Physician: Merton Border, MD  HPI: MARLON VONRUDEN is a 72 y.o. female seen for follow up of Acute on Chronic Respiratory Failure.  She is back on her weaning with pressure support currently is on 35% FiO2.  She has been on a pressure of 16 with a PEEP of 5.  She is not able to tolerate anything lower at this time.  Medications: Reviewed on Rounds  Physical Exam:  Vitals: Temperature is 98.1 pulse 90 respiratory rate 18 blood pressure 116/69 saturations 100%  Ventilator Settings mode of ventilation pressure support FiO2 35% tidal volume 300 pressure support 16 PEEP 5  . General: Comfortable at this time . Eyes: Grossly normal lids, irises & conjunctiva . ENT: grossly tongue is normal . Neck: no obvious mass . Cardiovascular: S1 S2 normal no gallop . Respiratory: No rhonchi no rales are noted . Abdomen: soft . Skin: no rash seen on limited exam . Musculoskeletal: not rigid . Psychiatric:unable to assess . Neurologic: no seizure no involuntary movements         Lab Data:   Basic Metabolic Panel: Recent Labs  Lab 11/25/17 0535 11/28/17 0721  NA 138 139  K 4.3 4.1  CL 96* 100  CO2 34* 30  GLUCOSE 123* 89  BUN 26* 19  CREATININE 0.39* 0.36*  CALCIUM 9.4 9.2  MG 1.9 1.9  PHOS 3.6 3.4    Liver Function Tests: No results for input(s): AST, ALT, ALKPHOS, BILITOT, PROT, ALBUMIN in the last 168 hours. No results for input(s): LIPASE, AMYLASE in the last 168 hours. No results for input(s): AMMONIA in the last 168 hours.  CBC: Recent Labs  Lab 11/26/17 0720 11/28/17 0721  WBC 7.6 5.9  HGB 10.7* 10.4*  HCT 34.2* 33.5*  MCV 80.3 80.5  PLT 158 190    Cardiac Enzymes: No results for input(s): CKTOTAL, CKMB, CKMBINDEX, TROPONINI in the last  168 hours.  BNP (last 3 results) No results for input(s): BNP in the last 8760 hours.  ProBNP (last 3 results) No results for input(s): PROBNP in the last 8760 hours.  Radiological Exams: No results found.  Assessment/Plan Principal Problem:   Acute on chronic respiratory failure with hypoxia (HCC) Active Problems:   Disorders of diaphragm   Atrial fibrillation (HCC)   Incomplete quadriplegia at C5-6 level (HCC)   History of stroke   1. Acute on chronic respiratory failure with hypoxia we will continue weaning on the pressure support titrate oxygen continue with aggressive pulmonary toilet. 2. Diaphragmatic paralysis continue with present therapy 3. Complete quadriplegia C5-6 respiratory therapy 4. History of stroke restorative therapy 5. Chronic atrial fibrillation rate is controlled continue with present therapy   I have personally seen and evaluated the patient, evaluated laboratory and imaging results, formulated the assessment and plan and placed orders. The Patient requires high complexity decision making for assessment and support.  Case was discussed on Rounds with the Respiratory Therapy Staff  Allyne Gee, MD [facility] Pulmonary Critical Care Medicine Sleep Medicine

## 2017-12-01 LAB — BASIC METABOLIC PANEL
Anion gap: 7 (ref 5–15)
BUN: 23 mg/dL (ref 8–23)
CALCIUM: 9.4 mg/dL (ref 8.9–10.3)
CO2: 30 mmol/L (ref 22–32)
CREATININE: 0.34 mg/dL — AB (ref 0.44–1.00)
Chloride: 100 mmol/L (ref 98–111)
GFR calc Af Amer: >60 mL/min (ref >60)
Glucose, Bld: 87 mg/dL (ref 70–99)
Potassium: 5.1 mmol/L (ref 3.5–5.1)
SODIUM: 137 mmol/L (ref 135–145)

## 2017-12-01 LAB — PHOSPHORUS: Phosphorus: 4 mg/dL (ref 2.5–4.6)

## 2017-12-01 LAB — MAGNESIUM: MAGNESIUM: 1.8 mg/dL (ref 1.7–2.4)

## 2017-12-03 NOTE — Progress Notes (Signed)
Pulmonary Critical Care Medicine [place]   PULMONARY CRITICAL CARE SERVICE  PROGRESS NOTE  Date of Service: 12/03/2017  Lindsay Mills  [id number]:[id number]  DOB: 12-30-1945   DOA: 09/18/2017  Referring Physician: Merton Border, MD  HPI: Lindsay Mills is a 72 y.o. female seen for follow up of Acute on Chronic Respiratory Failure.  Patient is on full support has been on pressure support mode.  Patient's right now on 28% with a pressure of 16/5  Medications: Reviewed on Rounds  Physical Exam:  Vitals: Temperature 96.5 pulse 93 respiratory rate 16 blood pressure 128/70 saturation 99%  Ventilator Settings mode of ventilation pressure support FiO2 28% tidal volume is 300  . General: Comfortable at this time . Eyes: Grossly normal lids, irises & conjunctiva . ENT: grossly tongue is normal . Neck: no obvious mass . Cardiovascular: S1 S2 normal no gallop . Respiratory: Coarse breath sounds no rhonchi . Abdomen: soft . Skin: no rash seen on limited exam . Musculoskeletal: not rigid . Psychiatric:unable to assess . Neurologic: no seizure no involuntary movements         Lab Data:   Basic Metabolic Panel: Recent Labs  Lab 11/28/17 0721 12/01/17 0545  NA 139 137  K 4.1 5.1  CL 100 100  CO2 30 30  GLUCOSE 89 87  BUN 19 23  CREATININE 0.36* 0.34*  CALCIUM 9.2 9.4  MG 1.9 1.8  PHOS 3.4 4.0    Liver Function Tests: No results for input(s): AST, ALT, ALKPHOS, BILITOT, PROT, ALBUMIN in the last 168 hours. No results for input(s): LIPASE, AMYLASE in the last 168 hours. No results for input(s): AMMONIA in the last 168 hours.  CBC: Recent Labs  Lab 11/28/17 0721  WBC 5.9  HGB 10.4*  HCT 33.5*  MCV 80.5  PLT 190    Cardiac Enzymes: No results for input(s): CKTOTAL, CKMB, CKMBINDEX, TROPONINI in the last 168 hours.  BNP (last 3 results) No results for input(s): BNP in the last 8760 hours.  ProBNP (last 3 results) No results for input(s):  PROBNP in the last 8760 hours.  Radiological Exams: No results found.  Assessment/Plan Principal Problem:   Acute on chronic respiratory failure with hypoxia (HCC) Active Problems:   Disorders of diaphragm   Atrial fibrillation (HCC)   Incomplete quadriplegia at C5-6 level (HCC)   History of stroke   1. Acute on chronic respiratory failure with continued trying to wean on pressure support prognosis guarded.  Patient has not been really able to come down below the pressure of 16 2. Diaphragmatic paralysis at baseline we will continue to follow 3. Chronic atrial fibrillation rate is controlled we will monitor 4. History of stroke right now is unchanged continue with therapy 5. Incomplete quadriplegia continue with therapy   I have personally seen and evaluated the patient, evaluated laboratory and imaging results, formulated the assessment and plan and placed orders. The Patient requires high complexity decision making for assessment and support.  Case was discussed on Rounds with the Respiratory Therapy Staff  Allyne Gee, MD [facility] Pulmonary Critical Care Medicine Sleep Medicine

## 2017-12-04 LAB — BASIC METABOLIC PANEL
Anion gap: 8 (ref 5–15)
BUN: 21 mg/dL (ref 8–23)
CALCIUM: 9.1 mg/dL (ref 8.9–10.3)
CHLORIDE: 100 mmol/L (ref 98–111)
CO2: 30 mmol/L (ref 22–32)
CREATININE: 0.31 mg/dL — AB (ref 0.44–1.00)
GFR calc non Af Amer: >60 mL/min (ref >60)
Glucose, Bld: 124 mg/dL — ABNORMAL HIGH (ref 70–99)
Potassium: 4 mmol/L (ref 3.5–5.1)
SODIUM: 138 mmol/L (ref 135–145)

## 2017-12-04 LAB — PHOSPHORUS: PHOSPHORUS: 3.6 mg/dL (ref 2.5–4.6)

## 2017-12-04 LAB — MAGNESIUM: MAGNESIUM: 1.7 mg/dL (ref 1.7–2.4)

## 2017-12-04 NOTE — Progress Notes (Signed)
Pulmonary Critical Care Medicine [place]   PULMONARY CRITICAL CARE SERVICE  PROGRESS NOTE  Date of Service: 12/04/2017  Lindsay Mills  BJY:[id number]  DOB: 1946-01-19   DOA: 09/18/2017  Referring Physician: Merton Border, MD  HPI: Lindsay Mills is a 72 y.o. female seen for follow up of Acute on Chronic Respiratory Failure.  Remains on pressure support Raynaud's on pressure support of 16/5  Medications: Reviewed on Rounds  Physical Exam:  Vitals: Temperature 98.0 pulse 90 respiratory rate 18 blood pressure 134/80 saturations 100%  Ventilator Settings mode of ventilation pressure support FiO2 28% tidal volume 300 pressure support 16/5  . General: Comfortable at this time . Eyes: Grossly normal lids, irises & conjunctiva . ENT: grossly tongue is normal . Neck: no obvious mass . Cardiovascular: S1 S2 normal no gallop . Respiratory: No rhonchi at this time . Abdomen: soft . Skin: no rash seen on limited exam . Musculoskeletal: not rigid . Psychiatric:unable to assess . Neurologic: no seizure no involuntary movements         Lab Data:   Basic Metabolic Panel: Recent Labs  Lab 11/28/17 0721 12/01/17 0545 12/04/17 0509  NA 139 137 138  K 4.1 5.1 4.0  CL 100 100 100  CO2 30 30 30   GLUCOSE 89 87 124*  BUN 19 23 21   CREATININE 0.36* 0.34* 0.31*  CALCIUM 9.2 9.4 9.1  MG 1.9 1.8 1.7  PHOS 3.4 4.0 3.6    Liver Function Tests: No results for input(s): AST, ALT, ALKPHOS, BILITOT, PROT, ALBUMIN in the last 168 hours. No results for input(s): LIPASE, AMYLASE in the last 168 hours. No results for input(s): AMMONIA in the last 168 hours.  CBC: Recent Labs  Lab 11/28/17 0721  WBC 5.9  HGB 10.4*  HCT 33.5*  MCV 80.5  PLT 190    Cardiac Enzymes: No results for input(s): CKTOTAL, CKMB, CKMBINDEX, TROPONINI in the last 168 hours.  BNP (last 3 results) No results for input(s): BNP in the last 8760 hours.  ProBNP (last 3 results) No  results for input(s): PROBNP in the last 8760 hours.  Radiological Exams: No results found.  Assessment/Plan Principal Problem:   Acute on chronic respiratory failure with hypoxia (HCC) Active Problems:   Disorders of diaphragm   Atrial fibrillation (HCC)   Incomplete quadriplegia at C5-6 level (HCC)   History of stroke   1. Acute on chronic respiratory failure with hypoxia we will continue with full support wean on pressure support 16/5 2. Diaphragmatic paralysis continue with supportive care 3. Atrial fibrillation rate is controlled 4. Incomplete quadriplegia continue with restorative therapy 5. History of stroke at baseline   I have personally seen and evaluated the patient, evaluated laboratory and imaging results, formulated the assessment and plan and placed orders. The Patient requires high complexity decision making for assessment and support.  Case was discussed on Rounds with the Respiratory Therapy Staff  Allyne Gee, MD [facility] Pulmonary Critical Care Medicine Sleep Medicine

## 2017-12-05 ENCOUNTER — Other Ambulatory Visit (HOSPITAL_COMMUNITY): Payer: Self-pay

## 2017-12-05 DIAGNOSIS — J9811 Atelectasis: Secondary | ICD-10-CM | POA: Diagnosis not present

## 2017-12-05 DIAGNOSIS — J9690 Respiratory failure, unspecified, unspecified whether with hypoxia or hypercapnia: Secondary | ICD-10-CM | POA: Diagnosis not present

## 2017-12-05 LAB — CBC
HEMATOCRIT: 35.1 % — AB (ref 36.0–46.0)
HEMOGLOBIN: 10.8 g/dL — AB (ref 12.0–15.0)
MCH: 25.3 pg — AB (ref 26.0–34.0)
MCHC: 30.8 g/dL (ref 30.0–36.0)
MCV: 82.2 fL (ref 78.0–100.0)
Platelets: 240 10*3/uL (ref 150–400)
RBC: 4.27 MIL/uL (ref 3.87–5.11)
RDW: 21.9 % — ABNORMAL HIGH (ref 11.5–15.5)
WBC: 6 10*3/uL (ref 4.0–10.5)

## 2017-12-05 LAB — MAGNESIUM: Magnesium: 2.1 mg/dL (ref 1.7–2.4)

## 2017-12-05 NOTE — Progress Notes (Signed)
Pulmonary Critical Care Medicine [place]   PULMONARY CRITICAL CARE SERVICE  PROGRESS NOTE  Date of Service: 12/05/2017  Lindsay Mills  [id number]  DOB: June 11, 1945   DOA: 09/18/2017  Referring Physician: Merton Border, MD  HPI: Lindsay Mills is a 72 y.o. female seen for follow up of Acute on Chronic Respiratory Failure.  Full vent support.  She was stable to do 14 hours yesterday on pressure support this morning she failed her spontaneous breathing trial  Medications: Reviewed on Rounds  Physical Exam:  Vitals: Temperature 97.9 pulse 61 respiratory 20 blood pressure 101/72 saturation 94%  Ventilator Settings mode of ventilation pressure assist control FiO2 35% tidal volume 457 PEEP 5  . General: Comfortable at this time . Eyes: Grossly normal lids, irises & conjunctiva . ENT: grossly tongue is normal . Neck: no obvious mass . Cardiovascular: S1 S2 normal no gallop . Respiratory: Scattered few distant rhonchi are noted . Abdomen: soft . Skin: no rash seen on limited exam . Musculoskeletal: not rigid . Psychiatric:unable to assess . Neurologic: no seizure no involuntary movements         Lab Data:   Basic Metabolic Panel: Recent Labs  Lab 12/01/17 0545 12/04/17 0509 12/05/17 0552  NA 137 138  --   K 5.1 4.0  --   CL 100 100  --   CO2 30 30  --   GLUCOSE 87 124*  --   BUN 23 21  --   CREATININE 0.34* 0.31*  --   CALCIUM 9.4 9.1  --   MG 1.8 1.7 2.1  PHOS 4.0 3.6  --     Liver Function Tests: No results for input(s): AST, ALT, ALKPHOS, BILITOT, PROT, ALBUMIN in the last 168 hours. No results for input(s): LIPASE, AMYLASE in the last 168 hours. No results for input(s): AMMONIA in the last 168 hours.  CBC: Recent Labs  Lab 12/05/17 0552  WBC 6.0  HGB 10.8*  HCT 35.1*  MCV 82.2  PLT 240    Cardiac Enzymes: No results for input(s): CKTOTAL, CKMB, CKMBINDEX, TROPONINI in the last 168 hours.  BNP (last 3 results) No  results for input(s): BNP in the last 8760 hours.  ProBNP (last 3 results) No results for input(s): PROBNP in the last 8760 hours.  Radiological Exams: No results found.  Assessment/Plan Principal Problem:   Acute on chronic respiratory failure with hypoxia (HCC) Active Problems:   Disorders of diaphragm   Atrial fibrillation (HCC)   Incomplete quadriplegia at C5-6 level (HCC)   History of stroke   1. Acute on chronic respiratory failure with hypoxia we will continue with checking the spontaneous breathing trial today I think she should be able to rest and reassess again tomorrow. 2. Diaphragmatic paralysis continue supportive care 3. Incomplete quadriplegia restorative therapy physical therapy 4. History of stroke continue physical therapy 5. Chronic atrial fibrillation rate is controlled we will continue to monitor   I have personally seen and evaluated the patient, evaluated laboratory and imaging results, formulated the assessment and plan and placed orders. The Patient requires high complexity decision making for assessment and support.  Case was discussed on Rounds with the Respiratory Therapy Staff  Allyne Gee, MD [facility] Pulmonary Critical Care Medicine Sleep Medicine

## 2017-12-06 ENCOUNTER — Other Ambulatory Visit (HOSPITAL_COMMUNITY): Payer: Self-pay

## 2017-12-06 DIAGNOSIS — J9600 Acute respiratory failure, unspecified whether with hypoxia or hypercapnia: Secondary | ICD-10-CM | POA: Diagnosis not present

## 2017-12-06 LAB — PROTIME-INR
INR: 1.15
PROTHROMBIN TIME: 14.6 s (ref 11.4–15.2)

## 2017-12-06 NOTE — Progress Notes (Signed)
Pulmonary Critical Care Medicine [facility] [place]   PULMONARY CRITICAL CARE SERVICE  PROGRESS NOTE  Date of Service: 12/06/2017  SACHET TAFEL  [id number]  DOB: 07-Apr-1946   DOA: 09/18/2017  Referring Physician: Carron Curie, MD  HPI: Lindsay Mills is a 72 y.o. female seen for follow up of Acute on Chronic Respiratory Failure.  Patient is on weaning again today today seems to be doing a little bit better with the pressure support weaning right now is on 16/5 pressure  Medications: Reviewed on Rounds  Physical Exam:  Vitals: Temperature 97.5 pulse 96 respiratory rate 25 blood pressure 135/78 saturations 97%  Ventilator Settings mode of ventilation pressure support FiO2 28% tidal volume 300 pressure support 16 PEEP 5  . General: Comfortable at this time . Eyes: Grossly normal lids, irises & conjunctiva . ENT: grossly tongue is normal . Neck: no obvious mass . Cardiovascular: S1 S2 normal no gallop . Respiratory: Scattered rhonchi no rales . Abdomen: soft . Skin: no rash seen on limited exam . Musculoskeletal: not rigid . Psychiatric:unable to assess . Neurologic: no seizure no involuntary movements         Lab Data:   Basic Metabolic Panel: Recent Labs  Lab 12/01/17 0545 12/04/17 0509 12/05/17 0552  NA 137 138  --   K 5.1 4.0  --   CL 100 100  --   CO2 30 30  --   GLUCOSE 87 124*  --   BUN 23 21  --   CREATININE 0.34* 0.31*  --   CALCIUM 9.4 9.1  --   MG 1.8 1.7 2.1  PHOS 4.0 3.6  --     Liver Function Tests: No results for input(s): AST, ALT, ALKPHOS, BILITOT, PROT, ALBUMIN in the last 168 hours. No results for input(s): LIPASE, AMYLASE in the last 168 hours. No results for input(s): AMMONIA in the last 168 hours.  CBC: Recent Labs  Lab 12/05/17 0552  WBC 6.0  HGB 10.8*  HCT 35.1*  MCV 82.2  PLT 240    Cardiac Enzymes: No results for input(s): CKTOTAL, CKMB, CKMBINDEX, TROPONINI in the last 168 hours.  BNP (last 3  results) No results for input(s): BNP in the last 8760 hours.  ProBNP (last 3 results) No results for input(s): PROBNP in the last 8760 hours.  Radiological Exams: Dg Chest Port 1 View  Result Date: 12/06/2017 CLINICAL DATA:  Acute respiratory distress EXAM: PORTABLE CHEST 1 VIEW COMPARISON:  Portable chest x-ray of 12/05/2017 and 09/18/2017 FINDINGS: The lungs appear clear and hyperaerated. Cardiomegaly is stable. Arrington rods are unchanged in position and tracheostomy remains. A loop recorder is unchanged in position. Anterior lower cervical spine fusion plate remains. IMPRESSION: Hyperaeration. No active lung disease. No change in mild cardiomegaly. Electronically Signed   By: Dwyane Dee M.D.   On: 12/06/2017 11:46   Dg Chest Port 1 View  Result Date: 12/05/2017 CLINICAL DATA:  Respiratory failure EXAM: PORTABLE CHEST 1 VIEW COMPARISON:  10/16/2017 FINDINGS: Tracheostomy tube is stable. NG tube is been removed. There is elevation of the left hemidiaphragm with basilar atelectasis. Lungs are otherwise clear. No pneumothorax. Thoracolumbar fusion hardware is in place. IMPRESSION: Stable elevation of the left hemidiaphragm and basilar atelectasis. Electronically Signed   By: Jolaine Click M.D.   On: 12/05/2017 16:55    Assessment/Plan Principal Problem:   Acute on chronic respiratory failure with hypoxia [facility]) Active Problems:   Disorders of diaphragm   Atrial fibrillation (HCC)  Incomplete quadriplegia at C5-6 level (HCC)   History of stroke   1. Acute on chronic respiratory failure with hypoxia we will continue with the wean on pressure support today she is tolerating a little bit better. 2. Diaphragmatic paralysis unchanged stable 3. Chronic atrial fibrillation rate is controlled 4. Incomplete quadriplegia stable at this time 5. History of stroke continue with restorative therapy   I have personally seen and evaluated the patient, evaluated laboratory and imaging results,  formulated the assessment and plan and placed orders. The Patient requires high complexity decision making for assessment and support.  Case was discussed on Rounds with the Respiratory Therapy Staff  Yevonne Pax, MD [facility] Pulmonary Critical Care Medicine Sleep Medicine

## 2017-12-07 LAB — BASIC METABOLIC PANEL
Anion gap: 6 (ref 5–15)
BUN: 19 mg/dL (ref 8–23)
CALCIUM: 8.9 mg/dL (ref 8.9–10.3)
CO2: 32 mmol/L (ref 22–32)
Chloride: 100 mmol/L (ref 98–111)
Creatinine, Ser: 0.36 mg/dL — ABNORMAL LOW (ref 0.44–1.00)
GFR calc Af Amer: >60 mL/min (ref >60)
GFR calc non Af Amer: >60 mL/min (ref >60)
Glucose, Bld: 116 mg/dL — ABNORMAL HIGH (ref 70–99)
Potassium: 3.8 mmol/L (ref 3.5–5.1)
Sodium: 138 mmol/L (ref 135–145)

## 2017-12-07 LAB — PHOSPHORUS: Phosphorus: 3.5 mg/dL (ref 2.5–4.6)

## 2017-12-07 LAB — MAGNESIUM: MAGNESIUM: 1.9 mg/dL (ref 1.7–2.4)

## 2017-12-07 NOTE — Progress Notes (Signed)
Pulmonary Critical Care Medicine [facility] [place]   PULMONARY CRITICAL CARE SERVICE  PROGRESS NOTE  Date of Service: 12/07/2017  Lindsay Mills  [id number]  DOB: Nov 19, 1945   DOA: 09/18/2017  Referring Physician: Carron Curie, MD  HPI: Lindsay Mills is a 72 y.o. female seen for follow up of Acute on Chronic Respiratory Failure.  Right now patient is on pressure support mode has been on 28% oxygen.  The patient has been tolerating the 16/5 to wean today  Medications: Reviewed on Rounds  Physical Exam:  Vitals: Temperature 97.9 pulse 78 respiratory rate 18 blood pressure 144/85 saturations 100%  Ventilator Settings mode of ventilation pressure support FiO2 20% tidal volume is 400 pressure support 16 PEEP 5  . General: Comfortable at this time . Eyes: Grossly normal lids, irises & conjunctiva . ENT: grossly tongue is normal . Neck: no obvious mass . Cardiovascular: S1 S2 normal no gallop . Respiratory: No rhonchi or rales are noted . Abdomen: soft . Skin: no rash seen on limited exam . Musculoskeletal: not rigid . Psychiatric:unable to assess . Neurologic: no seizure no involuntary movements         Lab Data:   Basic Metabolic Panel: Recent Labs  Lab 12/01/17 0545 12/04/17 0509 12/05/17 0552 12/07/17 0538  NA 137 138  --  138  K 5.1 4.0  --  3.8  CL 100 100  --  100  CO2 30 30  --  32  GLUCOSE 87 124*  --  116*  BUN 23 21  --  19  CREATININE 0.34* 0.31*  --  0.36*  CALCIUM 9.4 9.1  --  8.9  MG 1.8 1.7 2.1 1.9  PHOS 4.0 3.6  --  3.5    Liver Function Tests: No results for input(s): AST, ALT, ALKPHOS, BILITOT, PROT, ALBUMIN in the last 168 hours. No results for input(s): LIPASE, AMYLASE in the last 168 hours. No results for input(s): AMMONIA in the last 168 hours.  CBC: Recent Labs  Lab 12/05/17 0552  WBC 6.0  HGB 10.8*  HCT 35.1*  MCV 82.2  PLT 240    Cardiac Enzymes: No results for input(s): CKTOTAL, CKMB, CKMBINDEX,  TROPONINI in the last 168 hours.  BNP (last 3 results) No results for input(s): BNP in the last 8760 hours.  ProBNP (last 3 results) No results for input(s): PROBNP in the last 8760 hours.  Radiological Exams: Dg Chest Port 1 View  Result Date: 12/06/2017 CLINICAL DATA:  Acute respiratory distress EXAM: PORTABLE CHEST 1 VIEW COMPARISON:  Portable chest x-ray of 12/05/2017 and 09/18/2017 FINDINGS: The lungs appear clear and hyperaerated. Cardiomegaly is stable. Arrington rods are unchanged in position and tracheostomy remains. A loop recorder is unchanged in position. Anterior lower cervical spine fusion plate remains. IMPRESSION: Hyperaeration. No active lung disease. No change in mild cardiomegaly. Electronically Signed   By: Dwyane Dee M.D.   On: 12/06/2017 11:46   Dg Chest Port 1 View  Result Date: 12/05/2017 CLINICAL DATA:  Respiratory failure EXAM: PORTABLE CHEST 1 VIEW COMPARISON:  10/16/2017 FINDINGS: Tracheostomy tube is stable. NG tube is been removed. There is elevation of the left hemidiaphragm with basilar atelectasis. Lungs are otherwise clear. No pneumothorax. Thoracolumbar fusion hardware is in place. IMPRESSION: Stable elevation of the left hemidiaphragm and basilar atelectasis. Electronically Signed   By: Jolaine Click M.D.   On: 12/05/2017 16:55    Assessment/Plan Principal Problem:   Acute on chronic respiratory failure with hypoxia (HCC) Active  Problems:   Disorders of diaphragm   Atrial fibrillation (HCC)   Incomplete quadriplegia at C5-6 level (HCC)   History of stroke   1. Acute on chronic respiratory failure with hypoxia patient right now will be continued on the pressure support wean as tolerated we will continue with pulmonary toilet secretion management. 2. Distal diaphragmatic paralysis at baseline we will continue to follow 3. Atrial fibrillation rate is controlled 4. C5-6 quadriplegia we will continue with present management 5. History of stroke at  baseline we will continue to follow   I have personally seen and evaluated the patient, evaluated laboratory and imaging results, formulated the assessment and plan and placed orders. The Patient requires high complexity decision making for assessment and support.  Case was discussed on Rounds with the Respiratory Therapy Staff  Yevonne Pax, MD [facility] Pulmonary Critical Care Medicine Sleep Medicine

## 2017-12-08 NOTE — Progress Notes (Signed)
Pulmonary Critical Care Medicine [place]   PULMONARY CRITICAL CARE SERVICE  PROGRESS NOTE  Date of Service: 12/08/2017  Lindsay Mills  [id number]  DOB: [birth year]   DOA: 09/18/2017  Referring Physician: Merton Border, MD  HPI: Lindsay Mills is a 72 y.o. female seen for follow up of Acute on Chronic Respiratory Failure.  She is on pressure support mode comfortable without distress at this time.  Patient is on 28% FiO2.  Pressure support is at 16 PEEP of 5 with tidal volumes of about 310 cc  Medications: Reviewed on Rounds  Physical Exam:  Vitals: Temperature 98.1 pulse 83 respiratory 23 blood pressure 147/98 saturations 97%  Ventilator Settings mode of ventilation pressure support FiO2 28% tidal volume 310 pressure support 16 PEEP 5  . General: Comfortable at this time . Eyes: Grossly normal lids, irises & conjunctiva . ENT: grossly tongue is normal . Neck: no obvious mass . Cardiovascular: S1 S2 normal no gallop . Respiratory: Scattered rhonchi noted bilaterally . Abdomen: soft . Skin: no rash seen on limited exam . Musculoskeletal: not rigid . Psychiatric:unable to assess . Neurologic: no seizure no involuntary movements         Lab Data:   Basic Metabolic Panel: Recent Labs  Lab 12/04/17 0509 12/05/17 0552 12/07/17 0538  NA 138  --  138  K 4.0  --  3.8  CL 100  --  100  CO2 30  --  32  GLUCOSE 124*  --  116*  BUN 21  --  19  CREATININE 0.31*  --  0.36*  CALCIUM 9.1  --  8.9  MG 1.7 2.1 1.9  PHOS 3.6  --  3.5    ABG: No results for input(s): PHART, PCO2ART, PO2ART, HCO3, O2SAT in the last 168 hours.  Liver Function Tests: No results for input(s): AST, ALT, ALKPHOS, BILITOT, PROT, ALBUMIN in the last 168 hours. No results for input(s): LIPASE, AMYLASE in the last 168 hours. No results for input(s): AMMONIA in the last 168 hours.  CBC: Recent Labs  Lab 12/05/17 0552  WBC 6.0  HGB 10.8*  HCT 35.1*  MCV 82.2  PLT  240    Cardiac Enzymes: No results for input(s): CKTOTAL, CKMB, CKMBINDEX, TROPONINI in the last 168 hours.  BNP (last 3 results) No results for input(s): BNP in the last 8760 hours.  ProBNP (last 3 results) No results for input(s): PROBNP in the last 8760 hours.  Radiological Exams: No results found.  Assessment/Plan Principal Problem:   Acute on chronic respiratory failure with hypoxia (HCC) Active Problems:   Disorders of diaphragm   Atrial fibrillation (HCC)   Incomplete quadriplegia at C5-6 level (HCC)   History of stroke   1. Acute on chronic respiratory failure hypoxia patient continues on full support not able to wean.  Continue with pulmonary toilet and follow 2. Diaphragmatic paralysis at baseline we will continue his present management. 3. Chronic atrial fibrillation rate is controlled 4. Incomplete quadriplegia C5-6 continue supportive care 5. History of stroke at baseline we will follow   I have personally seen and evaluated the patient, evaluated laboratory and imaging results, formulated the assessment and plan and placed orders. The Patient requires high complexity decision making for assessment and support.  Case was discussed on Rounds with the Respiratory Therapy Staff  Allyne Gee, MD [facility] Pulmonary Critical Care Medicine Sleep Medicine

## 2017-12-09 NOTE — Progress Notes (Signed)
Pulmonary Critical Care Medicine [place]   PULMONARY CRITICAL CARE SERVICE  PROGRESS NOTE  Date of Service: 12/09/2017  Lindsay Mills  [id number]  DOB: [birth year]   DOA: 09/18/2017  Referring Physician: Merton Border, MD  HPI: Lindsay Mills is a 72 y.o. female seen for follow up of Acute on Chronic Respiratory Failure.  Patient is doing fairly well has been on pressure support she was not able to tolerate a pressure support of 14  Medications: Reviewed on Rounds  Physical Exam:  Vitals: Temperature 97.2 pulse 72 respiratory 18 blood pressure 146/89 saturations 99%  Ventilator Settings mode of ventilation pressure support FiO2 28% tidal volume 300 pressure support 16 PEEP 5  . General: Comfortable at this time . Eyes: Grossly normal lids, irises & conjunctiva . ENT: grossly tongue is normal . Neck: no obvious mass . Cardiovascular: S1 S2 normal no gallop . Respiratory: Coarse breath sounds few rhonchi . Abdomen: soft . Skin: no rash seen on limited exam . Musculoskeletal: not rigid . Psychiatric:unable to assess . Neurologic: no seizure no involuntary movements         Lab Data:   Basic Metabolic Panel: Recent Labs  Lab 12/04/17 0509 12/05/17 0552 12/07/17 0538  NA 138  --  138  K 4.0  --  3.8  CL 100  --  100  CO2 30  --  32  GLUCOSE 124*  --  116*  BUN 21  --  19  CREATININE 0.31*  --  0.36*  CALCIUM 9.1  --  8.9  MG 1.7 2.1 1.9  PHOS 3.6  --  3.5    ABG: No results for input(s): PHART, PCO2ART, PO2ART, HCO3, O2SAT in the last 168 hours.  Liver Function Tests: No results for input(s): AST, ALT, ALKPHOS, BILITOT, PROT, ALBUMIN in the last 168 hours. No results for input(s): LIPASE, AMYLASE in the last 168 hours. No results for input(s): AMMONIA in the last 168 hours.  CBC: Recent Labs  Lab 12/05/17 0552  WBC 6.0  HGB 10.8*  HCT 35.1*  MCV 82.2  PLT 240    Cardiac Enzymes: No results for input(s): CKTOTAL,  CKMB, CKMBINDEX, TROPONINI in the last 168 hours.  BNP (last 3 results) No results for input(s): BNP in the last 8760 hours.  ProBNP (last 3 results) No results for input(s): PROBNP in the last 8760 hours.  Radiological Exams: No results found.  Assessment/Plan Principal Problem:   Acute on chronic respiratory failure with hypoxia (HCC) Active Problems:   Disorders of diaphragm   Atrial fibrillation (HCC)   Incomplete quadriplegia at C5-6 level (HCC)   History of stroke   1. Acute on chronic respiratory failure with hypoxia we will continue with pressure support mode titrate oxygen as tolerated continue pulmonary toilet supportive care. 2. Diaphragmatic paralysis at baseline 3. Chronic atrial fibrillation rate is controlled we will follow 4. History of stroke at baseline we will continue present management 5. Quadriplegia C5-6 continue with rehab prognosis guarded   I have personally seen and evaluated the patient, evaluated laboratory and imaging results, formulated the assessment and plan and placed orders. The Patient requires high complexity decision making for assessment and support.  Case was discussed on Rounds with the Respiratory Therapy Staff  Allyne Gee, MD [facility] Pulmonary Critical Care Medicine Sleep Medicine

## 2017-12-10 LAB — BASIC METABOLIC PANEL
ANION GAP: 5 (ref 5–15)
BUN: 18 mg/dL (ref 8–23)
CHLORIDE: 101 mmol/L (ref 98–111)
CO2: 30 mmol/L (ref 22–32)
Calcium: 8.8 mg/dL — ABNORMAL LOW (ref 8.9–10.3)
Creatinine, Ser: 0.34 mg/dL — ABNORMAL LOW (ref 0.44–1.00)
GFR calc non Af Amer: >60 mL/min (ref >60)
Glucose, Bld: 111 mg/dL — ABNORMAL HIGH (ref 70–99)
POTASSIUM: 3.5 mmol/L (ref 3.5–5.1)
SODIUM: 136 mmol/L (ref 135–145)

## 2017-12-10 LAB — MAGNESIUM: MAGNESIUM: 1.9 mg/dL (ref 1.7–2.4)

## 2017-12-10 LAB — PHOSPHORUS: PHOSPHORUS: 3.4 mg/dL (ref 2.5–4.6)

## 2017-12-10 NOTE — Progress Notes (Signed)
Pulmonary Critical Care Medicine [place]   PULMONARY CRITICAL CARE SERVICE  PROGRESS NOTE  Date of Service: 12/10/2017  SILVINA HACKLEMAN  [id number]  DOB: [birth year]   DOA: 09/18/2017  Referring Physician: Merton Border, MD  HPI: Lindsay Mills is a 72 y.o. female seen for follow up of Acute on Chronic Respiratory Failure.  She is on pressure support was attempted on a pressure support of 14 and failed now is back on pressure support of 16  Medications: Reviewed on Rounds  Physical Exam:  Vitals: Temperature 98.2 pulse 86 respiratory rate 18 blood pressure 109/72 saturations 98%  Ventilator Settings mode of ventilation pressure support FiO2 20% tidal volume 300 pressure support 16 PEEP 5  . General: Comfortable at this time . Eyes: Grossly normal lids, irises & conjunctiva . ENT: grossly tongue is normal . Neck: no obvious mass . Cardiovascular: S1 S2 normal no gallop . Respiratory: Coarse rhonchi noted . Abdomen: soft . Skin: no rash seen on limited exam . Musculoskeletal: not rigid . Psychiatric:unable to assess . Neurologic: no seizure no involuntary movements         Lab Data:   Basic Metabolic Panel: Recent Labs  Lab 12/04/17 0509 12/05/17 0552 12/07/17 0538 12/10/17 0507  NA 138  --  138 136  K 4.0  --  3.8 3.5  CL 100  --  100 101  CO2 30  --  32 30  GLUCOSE 124*  --  116* 111*  BUN 21  --  19 18  CREATININE 0.31*  --  0.36* 0.34*  CALCIUM 9.1  --  8.9 8.8*  MG 1.7 2.1 1.9 1.9  PHOS 3.6  --  3.5 3.4    ABG: No results for input(s): PHART, PCO2ART, PO2ART, HCO3, O2SAT in the last 168 hours.  Liver Function Tests: No results for input(s): AST, ALT, ALKPHOS, BILITOT, PROT, ALBUMIN in the last 168 hours. No results for input(s): LIPASE, AMYLASE in the last 168 hours. No results for input(s): AMMONIA in the last 168 hours.  CBC: Recent Labs  Lab 12/05/17 0552  WBC 6.0  HGB 10.8*  HCT 35.1*  MCV 82.2  PLT 240     Cardiac Enzymes: No results for input(s): CKTOTAL, CKMB, CKMBINDEX, TROPONINI in the last 168 hours.  BNP (last 3 results) No results for input(s): BNP in the last 8760 hours.  ProBNP (last 3 results) No results for input(s): PROBNP in the last 8760 hours.  Radiological Exams: No results found.  Assessment/Plan Principal Problem:   Acute on chronic respiratory failure with hypoxia (HCC) Active Problems:   Disorders of diaphragm   Atrial fibrillation (HCC)   Incomplete quadriplegia at C5-6 level (HCC)   History of stroke   1. Acute on chronic respiratory failure with hypoxia we will continue to wean on pressure support which is basically her baseline at this point rest on the ventilator at night 2. Diaphragmatic paralysis at baseline continue with supportive care 3. Chronic atrial fibrillation rate is controlled prognosis: 4. Incomplete quadriplegia rehab physical therapy as tolerated 5. History of stroke again rehab as tolerated   I have personally seen and evaluated the patient, evaluated laboratory and imaging results, formulated the assessment and plan and placed orders. The Patient requires high complexity decision making for assessment and support.  Case was discussed on Rounds with the Respiratory Therapy Staff  Allyne Gee, MD [facility] Pulmonary Critical Care Medicine Sleep Medicine

## 2017-12-11 NOTE — Progress Notes (Signed)
Pulmonary Critical Care Medicine [place]   PULMONARY CRITICAL CARE SERVICE  PROGRESS NOTE  Date of Service: 12/11/2017  Lindsay Mills  [id number]  DOB: 1946-02-01   DOA: 09/18/2017  Referring Physician: Merton Border, MD  HPI: Lindsay Mills is a 72 y.o. female seen for follow up of Acute on Chronic Respiratory Failure.  Patient is on pressure support comfortable without distress.  Right now is on 28% FiO2 pressure support level is 16  Medications: Reviewed on Rounds  Physical Exam:  Vitals: Temperature 97.1 pulse 67 respiratory rate 18 blood pressure 146/85 saturations 99%  Ventilator Settings mode of ventilation pressure support FiO2 20% tidal volume 300 cc pressure support 16 PEEP 5  . General: Comfortable at this time . Eyes: Grossly normal lids, irises & conjunctiva . ENT: grossly tongue is normal . Neck: no obvious mass . Cardiovascular: S1 S2 normal no gallop . Respiratory: No rhonchi or rales are noted. . Abdomen: soft . Skin: no rash seen on limited exam . Musculoskeletal: not rigid . Psychiatric:unable to assess . Neurologic: no seizure no involuntary movements         Lab Data:   Basic Metabolic Panel: Recent Labs  Lab 12/05/17 0552 12/07/17 0538 12/10/17 0507  NA  --  138 136  K  --  3.8 3.5  CL  --  100 101  CO2  --  32 30  GLUCOSE  --  116* 111*  BUN  --  19 18  CREATININE  --  0.36* 0.34*  CALCIUM  --  8.9 8.8*  MG 2.1 1.9 1.9  PHOS  --  3.5 3.4    ABG: No results for input(s): PHART, PCO2ART, PO2ART, HCO3, O2SAT in the last 168 hours.  Liver Function Tests: No results for input(s): AST, ALT, ALKPHOS, BILITOT, PROT, ALBUMIN in the last 168 hours. No results for input(s): LIPASE, AMYLASE in the last 168 hours. No results for input(s): AMMONIA in the last 168 hours.  CBC: Recent Labs  Lab 12/05/17 0552  WBC 6.0  HGB 10.8*  HCT 35.1*  MCV 82.2  PLT 240    Cardiac Enzymes: No results for input(s):  CKTOTAL, CKMB, CKMBINDEX, TROPONINI in the last 168 hours.  BNP (last 3 results) No results for input(s): BNP in the last 8760 hours.  ProBNP (last 3 results) No results for input(s): PROBNP in the last 8760 hours.  Radiological Exams: No results found.  Assessment/Plan Principal Problem:   Acute on chronic respiratory failure with hypoxia (HCC) Active Problems:   Disorders of diaphragm   Atrial fibrillation (HCC)   Incomplete quadriplegia at C5-6 level (HCC)   History of stroke   1. Acute on chronic respiratory failure with hypoxia patient continues on full support and pressure support at this time.  She is not able to tolerate a pressure support level of 14. 2. Diaphragmatic paralysis leading to failure to wean as above we will continue with supportive care 3. Chronic atrial fibrillation rate is controlled 4. C5-6 quadriplegia at baseline continue physical therapy 5. History of stroke continue therapy as tolerated   I have personally seen and evaluated the patient, evaluated laboratory and imaging results, formulated the assessment and plan and placed orders. The Patient requires high complexity decision making for assessment and support.  Case was discussed on Rounds with the Respiratory Therapy Staff  Allyne Gee, MD [facility] Pulmonary Critical Care Medicine Sleep Medicine

## 2017-12-12 LAB — CBC
HCT: 32.3 % — ABNORMAL LOW (ref 36.0–46.0)
HEMOGLOBIN: 10.2 g/dL — AB (ref 12.0–15.0)
MCH: 25.4 pg — ABNORMAL LOW (ref 26.0–34.0)
MCHC: 31.6 g/dL (ref 30.0–36.0)
MCV: 80.3 fL (ref 78.0–100.0)
Platelets: 185 10*3/uL (ref 150–400)
RBC: 4.02 MIL/uL (ref 3.87–5.11)
RDW: 22.2 % — ABNORMAL HIGH (ref 11.5–15.5)
WBC: 6.7 10*3/uL (ref 4.0–10.5)

## 2017-12-12 LAB — BASIC METABOLIC PANEL
ANION GAP: 11 (ref 5–15)
BUN: 15 mg/dL (ref 8–23)
CALCIUM: 9.3 mg/dL (ref 8.9–10.3)
CO2: 26 mmol/L (ref 22–32)
Chloride: 99 mmol/L (ref 98–111)
Creatinine, Ser: 0.32 mg/dL — ABNORMAL LOW (ref 0.44–1.00)
Glucose, Bld: 103 mg/dL — ABNORMAL HIGH (ref 70–99)
Potassium: 3.8 mmol/L (ref 3.5–5.1)
Sodium: 136 mmol/L (ref 135–145)

## 2017-12-12 LAB — PHOSPHORUS: Phosphorus: 3.2 mg/dL (ref 2.5–4.6)

## 2017-12-12 LAB — MAGNESIUM: Magnesium: 1.9 mg/dL (ref 1.7–2.4)

## 2017-12-12 NOTE — Progress Notes (Signed)
Pulmonary Critical Care Medicine [place]   PULMONARY CRITICAL CARE SERVICE  PROGRESS NOTE  Date of Service: 12/12/2017  Lindsay Mills  [id number]  DOB: 17-Apr-1945   DOA: 09/18/2017  Referring Physician: Merton Border, MD  HPI: Lindsay Mills is a 72 y.o. female seen for follow up of Acute on Chronic Respiratory Failure.  Patient is on pressure support 16/5 at baseline  Medications: Reviewed on Rounds  Physical Exam:  Vitals: Temperature 96.6 pulse 100 respiratory rate 20 blood pressure 169/83 saturations 100%  Ventilator Settings mode of ventilation pressure support FiO2 28% pressure 16 PEEP 5  . General: Comfortable at this time . Eyes: Grossly normal lids, irises & conjunctiva . ENT: grossly tongue is normal . Neck: no obvious mass . Cardiovascular: S1 S2 normal no gallop . Respiratory: No rhonchi or rales are noted . Abdomen: soft . Skin: no rash seen on limited exam . Musculoskeletal: not rigid . Psychiatric:unable to assess . Neurologic: no seizure no involuntary movements         Lab Data:   Basic Metabolic Panel: Recent Labs  Lab 12/07/17 0538 12/10/17 0507 12/12/17 0755  NA 138 136 136  K 3.8 3.5 3.8  CL 100 101 99  CO2 32 30 26  GLUCOSE 116* 111* 103*  BUN 19 18 15   CREATININE 0.36* 0.34* 0.32*  CALCIUM 8.9 8.8* 9.3  MG 1.9 1.9 1.9  PHOS 3.5 3.4 3.2    ABG: No results for input(s): PHART, PCO2ART, PO2ART, HCO3, O2SAT in the last 168 hours.  Liver Function Tests: No results for input(s): AST, ALT, ALKPHOS, BILITOT, PROT, ALBUMIN in the last 168 hours. No results for input(s): LIPASE, AMYLASE in the last 168 hours. No results for input(s): AMMONIA in the last 168 hours.  CBC: Recent Labs  Lab 12/12/17 0755  WBC 6.7  HGB 10.2*  HCT 32.3*  MCV 80.3  PLT 185    Cardiac Enzymes: No results for input(s): CKTOTAL, CKMB, CKMBINDEX, TROPONINI in the last 168 hours.  BNP (last 3 results) No results for  input(s): BNP in the last 8760 hours.  ProBNP (last 3 results) No results for input(s): PROBNP in the last 8760 hours.  Radiological Exams: No results found.  Assessment/Plan Principal Problem:   Acute on chronic respiratory failure with hypoxia (HCC) Active Problems:   Disorders of diaphragm   Atrial fibrillation (HCC)   Incomplete quadriplegia at C5-6 level (HCC)   History of stroke   1. Acute on chronic respiratory failure with hypoxia we will continue with the pressure support 16/5 2. Diaphragmatic paralysis unchanged continue with supportive care 3. Chronic atrial fibrillation rate is controlled 4. C5-6 quadriplegia we will continue supportive care 5. History of stroke at baseline we will continue to monitor   I have personally seen and evaluated the patient, evaluated laboratory and imaging results, formulated the assessment and plan and placed orders. The Patient requires high complexity decision making for assessment and support.  Case was discussed on Rounds with the Respiratory Therapy Staff  Allyne Gee, MD [facility] Pulmonary Critical Care Medicine Sleep Medicine

## 2017-12-13 ENCOUNTER — Other Ambulatory Visit (HOSPITAL_COMMUNITY): Payer: Self-pay

## 2017-12-13 DIAGNOSIS — J90 Pleural effusion, not elsewhere classified: Secondary | ICD-10-CM | POA: Diagnosis not present

## 2017-12-13 LAB — BASIC METABOLIC PANEL
Anion gap: 9 (ref 5–15)
BUN: 21 mg/dL (ref 8–23)
CALCIUM: 8.9 mg/dL (ref 8.9–10.3)
CHLORIDE: 102 mmol/L (ref 98–111)
CO2: 26 mmol/L (ref 22–32)
CREATININE: 0.33 mg/dL — AB (ref 0.44–1.00)
GFR calc Af Amer: >60 mL/min (ref >60)
GFR calc non Af Amer: >60 mL/min (ref >60)
Glucose, Bld: 102 mg/dL — ABNORMAL HIGH (ref 70–99)
Potassium: 3.9 mmol/L (ref 3.5–5.1)
SODIUM: 137 mmol/L (ref 135–145)

## 2017-12-13 LAB — MAGNESIUM: Magnesium: 2 mg/dL (ref 1.7–2.4)

## 2017-12-13 LAB — PHOSPHORUS: Phosphorus: 3.3 mg/dL (ref 2.5–4.6)

## 2017-12-13 NOTE — Progress Notes (Signed)
Pulmonary Critical Care Medicine [facility] [place]   PULMONARY CRITICAL CARE SERVICE  PROGRESS NOTE  Date of Service: 12/13/2017  Lindsay Mills  [id number]  DOB: 06/21/1945   DOA: 09/18/2017  Referring Physician: Carron Curie, MD  HPI: Lindsay Mills is a 72 y.o. female seen for follow up of Acute on Chronic Respiratory Failure.  Patient is on full support and pressure support mode at this time.  Is tolerating the 16/5 weaning only was able to do 9 hours yesterday now is resumed  Medications: Reviewed on Rounds  Physical Exam:  Vitals: Temperature 97.4 pulse 85 respiratory rate 18 blood pressure 110/71 saturations 94%  Ventilator Settings mode of ventilation pressure support FiO2 28% pressure support 16 PEEP 5 tidal volume 300  . General: Comfortable at this time . Eyes: Grossly normal lids, irises & conjunctiva . ENT: grossly tongue is normal . Neck: no obvious mass . Cardiovascular: S1 S2 normal no gallop . Respiratory: Few rhonchi are noted . Abdomen: soft . Skin: no rash seen on limited exam . Musculoskeletal: not rigid . Psychiatric:unable to assess . Neurologic: no seizure no involuntary movements         Lab Data:   Basic Metabolic Panel: Recent Labs  Lab 12/07/17 0538 12/10/17 0507 12/12/17 0755 12/13/17 0522  NA 138 136 136 137  K 3.8 3.5 3.8 3.9  CL 100 101 99 102  CO2 32 30 26 26   GLUCOSE 116* 111* 103* 102*  BUN 19 18 15 21   CREATININE 0.36* 0.34* 0.32* 0.33*  CALCIUM 8.9 8.8* 9.3 8.9  MG 1.9 1.9 1.9 2.0  PHOS 3.5 3.4 3.2 3.3    ABG: No results for input(s): PHART, PCO2ART, PO2ART, HCO3, O2SAT in the last 168 hours.  Liver Function Tests: No results for input(s): AST, ALT, ALKPHOS, BILITOT, PROT, ALBUMIN in the last 168 hours. No results for input(s): LIPASE, AMYLASE in the last 168 hours. No results for input(s): AMMONIA in the last 168 hours.  CBC: Recent Labs  Lab 12/12/17 0755  WBC 6.7  HGB 10.2*  HCT 32.3*   MCV 80.3  PLT 185    Cardiac Enzymes: No results for input(s): CKTOTAL, CKMB, CKMBINDEX, TROPONINI in the last 168 hours.  BNP (last 3 results) No results for input(s): BNP in the last 8760 hours.  ProBNP (last 3 results) No results for input(s): PROBNP in the last 8760 hours.  Radiological Exams: Dg Chest Port 1 View  Result Date: 12/13/2017 CLINICAL DATA:  Pleural effusion EXAM: PORTABLE CHEST 1 VIEW COMPARISON:  12/06/2017 FINDINGS: Heart is normal in size. There is persistent elevation of the left hemidiaphragm which obscures the left lung base. Lungs are otherwise clear. Harrington rods in the thoracic and lumbar spine are stable. Tracheostomy tube is unchanged. Loop recorder projects over the left hilum. IMPRESSION: No active cardiopulmonary disease. Electronically Signed   By: Jolaine Click M.D.   On: 12/13/2017 10:46    Assessment/Plan Principal Problem:   Acute on chronic respiratory failure with hypoxia (HCC) Active Problems:   Disorders of diaphragm   Atrial fibrillation (HCC)   Incomplete quadriplegia at C5-6 level (HCC)   History of stroke   1. Acute on chronic respiratory failure with hypoxia we will continue with weaning on the pressure support as tolerated.  Hopefully will be able to do 12 to 18 hours today 2. Diaphragmatic paralysis at baseline continue with supportive care 3. Incomplete quadriplegia significant weakness still 4. Chronic atrial fibrillation rate is controlled 5. History  of stroke continue with rehab therapy   I have personally seen and evaluated the patient, evaluated laboratory and imaging results, formulated the assessment and plan and placed orders. The Patient requires high complexity decision making for assessment and support.  Case was discussed on Rounds with the Respiratory Therapy Staff  Yevonne Pax, MD [facility] Pulmonary Critical Care Medicine Sleep Medicine

## 2017-12-14 NOTE — Progress Notes (Signed)
Pulmonary Critical Care Medicine [facility] [place]   PULMONARY CRITICAL CARE SERVICE  PROGRESS NOTE  Date of Service: 12/14/2017  PAIGHTON NAVICKAS  [id number]  DOB: [birth year]   DOA: 09/18/2017  Referring Physician: Carron Curie, MD  HPI: Lindsay Mills is a 72 y.o. female seen for follow up of Acute on Chronic Respiratory Failure.  Patient is on full vent support was attempted on pressure support she did not tolerate it today  Medications: Reviewed on Rounds  Physical Exam:  Vitals: Temperature 97.8 pulse 80 respiratory rate 20 blood pressure 134/73 saturations 99%  Ventilator Settings mode of ventilation assist control FiO2 28% tidal volume 350 PEEP 5  . General: Comfortable at this time . Eyes: Grossly normal lids, irises & conjunctiva . ENT: grossly tongue is normal . Neck: no obvious mass . Cardiovascular: S1 S2 normal no gallop . Respiratory: Scattered rhonchi . Abdomen: soft . Skin: no rash seen on limited exam . Musculoskeletal: not rigid . Psychiatric:unable to assess . Neurologic: no seizure no involuntary movements         Lab Data:   Basic Metabolic Panel: Recent Labs  Lab 12/10/17 0507 12/12/17 0755 12/13/17 0522  NA 136 136 137  K 3.5 3.8 3.9  CL 101 99 102  CO2 30 26 26   GLUCOSE 111* 103* 102*  BUN 18 15 21   CREATININE 0.34* 0.32* 0.33*  CALCIUM 8.8* 9.3 8.9  MG 1.9 1.9 2.0  PHOS 3.4 3.2 3.3    ABG: No results for input(s): PHART, PCO2ART, PO2ART, HCO3, O2SAT in the last 168 hours.  Liver Function Tests: No results for input(s): AST, ALT, ALKPHOS, BILITOT, PROT, ALBUMIN in the last 168 hours. No results for input(s): LIPASE, AMYLASE in the last 168 hours. No results for input(s): AMMONIA in the last 168 hours.  CBC: Recent Labs  Lab 12/12/17 0755  WBC 6.7  HGB 10.2*  HCT 32.3*  MCV 80.3  PLT 185    Cardiac Enzymes: No results for input(s): CKTOTAL, CKMB, CKMBINDEX, TROPONINI in the last 168 hours.  BNP  (last 3 results) No results for input(s): BNP in the last 8760 hours.  ProBNP (last 3 results) No results for input(s): PROBNP in the last 8760 hours.  Radiological Exams: Dg Chest Port 1 View  Result Date: 12/13/2017 CLINICAL DATA:  Pleural effusion EXAM: PORTABLE CHEST 1 VIEW COMPARISON:  12/06/2017 FINDINGS: Heart is normal in size. There is persistent elevation of the left hemidiaphragm which obscures the left lung base. Lungs are otherwise clear. Harrington rods in the thoracic and lumbar spine are stable. Tracheostomy tube is unchanged. Loop recorder projects over the left hilum. IMPRESSION: No active cardiopulmonary disease. Electronically Signed   By: Jolaine Click M.D.   On: 12/13/2017 10:46    Assessment/Plan Principal Problem:   Acute on chronic respiratory failure with hypoxia (HCC) Active Problems:   Disorders of diaphragm   Atrial fibrillation (HCC)   Incomplete quadriplegia at C5-6 level (HCC)   History of stroke   1. Acute on chronic respiratory failure with hypoxia continue with weaning on pressure support if able to tolerate 2. Diaphragmatic paralysis continue with present management 3. Chronic atrial fibrillation rate controlled 4. History of stroke unchanged 5. Complete quadriplegia restorative therapy as tolerated   I have personally seen and evaluated the patient, evaluated laboratory and imaging results, formulated the assessment and plan and placed orders. The Patient requires high complexity decision making for assessment and support.  Case was discussed on Rounds with  the Respiratory Therapy Staff  Yevonne Pax, MD [facility] Pulmonary Critical Care Medicine Sleep Medicine

## 2017-12-15 NOTE — Progress Notes (Signed)
Pulmonary Critical Care Medicine [place]   PULMONARY CRITICAL CARE SERVICE  PROGRESS NOTE  Date of Service: 12/15/2017  Lindsay Mills  [id number]  DOB: 12-22-1945   DOA: 09/18/2017  Referring Physician: Merton Border, MD  HPI: Lindsay Mills is a 72 y.o. female seen for follow up of Acute on Chronic Respiratory Failure.  Currently is on pressure support mode has been on 28% oxygen with a pressure support of 16  Medications: Reviewed on Rounds  Physical Exam:  Vitals: Temperature 97.6 pulse 87 respiratory 18 blood pressure 133/82 saturation 97%  Ventilator Settings mode of ventilation pressure support FiO2 28% pressure support 16 PEEP 5 tidal volume about 300  . General: Comfortable at this time . Eyes: Grossly normal lids, irises & conjunctiva . ENT: grossly tongue is normal . Neck: no obvious mass . Cardiovascular: S1 S2 normal no gallop . Respiratory: No rhonchi no rales . Abdomen: soft . Skin: no rash seen on limited exam . Musculoskeletal: not rigid . Psychiatric:unable to assess . Neurologic: no seizure no involuntary movements         Lab Data:   Basic Metabolic Panel: Recent Labs  Lab 12/10/17 0507 12/12/17 0755 12/13/17 0522  NA 136 136 137  K 3.5 3.8 3.9  CL 101 99 102  CO2 30 26 26   GLUCOSE 111* 103* 102*  BUN 18 15 21   CREATININE 0.34* 0.32* 0.33*  CALCIUM 8.8* 9.3 8.9  MG 1.9 1.9 2.0  PHOS 3.4 3.2 3.3    ABG: No results for input(s): PHART, PCO2ART, PO2ART, HCO3, O2SAT in the last 168 hours.  Liver Function Tests: No results for input(s): AST, ALT, ALKPHOS, BILITOT, PROT, ALBUMIN in the last 168 hours. No results for input(s): LIPASE, AMYLASE in the last 168 hours. No results for input(s): AMMONIA in the last 168 hours.  CBC: Recent Labs  Lab 12/12/17 0755  WBC 6.7  HGB 10.2*  HCT 32.3*  MCV 80.3  PLT 185    Cardiac Enzymes: No results for input(s): CKTOTAL, CKMB, CKMBINDEX, TROPONINI in the last  168 hours.  BNP (last 3 results) No results for input(s): BNP in the last 8760 hours.  ProBNP (last 3 results) No results for input(s): PROBNP in the last 8760 hours.  Radiological Exams: No results found.  Assessment/Plan Principal Problem:   Acute on chronic respiratory failure with hypoxia (HCC) Active Problems:   Disorders of diaphragm   Atrial fibrillation (HCC)   Incomplete quadriplegia at C5-6 level (HCC)   History of stroke   1. Acute on chronic respiratory failure with hypoxia she is at baseline on the pressure support when not having large tidal volumes we will continue with supportive care. 2. Diaphragmatic paralysis at baseline continue with present management 3. Chronic atrial fibrillation rate is controlled we will monitor 4. C5-6 quadriplegia stable at this time 5. History of stroke stable unchanged   I have personally seen and evaluated the patient, evaluated laboratory and imaging results, formulated the assessment and plan and placed orders. The Patient requires high complexity decision making for assessment and support.  Case was discussed on Rounds with the Respiratory Therapy Staff  Allyne Gee, MD [facility] Pulmonary Critical Care Medicine Sleep Medicine

## 2017-12-16 LAB — BLOOD GAS, ARTERIAL
ACID-BASE EXCESS: 5.5 mmol/L — AB (ref 0.0–2.0)
Bicarbonate: 28.4 mmol/L — ABNORMAL HIGH (ref 20.0–28.0)
FIO2: 28
O2 SAT: 98.7 %
PATIENT TEMPERATURE: 98.6
PCO2 ART: 33 mmHg (ref 32.0–48.0)
PEEP: 5 cmH2O
PH ART: 7.543 — AB (ref 7.350–7.450)
PRESSURE CONTROL: 25 cmH2O
RATE: 18 resp/min
pO2, Arterial: 121 mmHg — ABNORMAL HIGH (ref 83.0–108.0)

## 2017-12-16 NOTE — Progress Notes (Signed)
Pulmonary Critical Care Medicine [place]   PULMONARY CRITICAL CARE SERVICE  PROGRESS NOTE  Date of Service: 12/16/2017  Lindsay Mills  [id number]:[id number]  DOB: [birth year]   DOA: 09/18/2017  Referring Physician: Merton Border, MD  HPI: Lindsay Mills is a 72 y.o. female seen for follow up of Acute on Chronic Respiratory Failure.  Reportedly patient had a panic attack was not able to wean was placed back on the ventilator right now is on assist control mode  Medications: Reviewed on Rounds  Physical Exam:  Vitals: Temperature 98.6 pulse 75 respiratory rate 20 blood pressure 139/77 saturation 98%  Ventilator Settings on assist control FiO2 28% tidal volume 448 PEEP 5  . General: Comfortable at this time . Eyes: Grossly normal lids, irises & conjunctiva . ENT: grossly tongue is normal . Neck: no obvious mass . Cardiovascular: S1 S2 normal no gallop . Respiratory: No rhonchi or rales are noted . Abdomen: soft . Skin: no rash seen on limited exam . Musculoskeletal: not rigid . Psychiatric:unable to assess . Neurologic: no seizure no involuntary movements         Lab Data:   Basic Metabolic Panel: Recent Labs  Lab 12/10/17 0507 12/12/17 0755 12/13/17 0522  NA 136 136 137  K 3.5 3.8 3.9  CL 101 99 102  CO2 30 26 26   GLUCOSE 111* 103* 102*  BUN 18 15 21   CREATININE 0.34* 0.32* 0.33*  CALCIUM 8.8* 9.3 8.9  MG 1.9 1.9 2.0  PHOS 3.4 3.2 3.3    ABG: Recent Labs  Lab 12/16/17 0805  PHART 7.543*  PCO2ART 33.0  PO2ART 121*  HCO3 28.4*  O2SAT 98.7    Liver Function Tests: No results for input(s): AST, ALT, ALKPHOS, BILITOT, PROT, ALBUMIN in the last 168 hours. No results for input(s): LIPASE, AMYLASE in the last 168 hours. No results for input(s): AMMONIA in the last 168 hours.  CBC: Recent Labs  Lab 12/12/17 0755  WBC 6.7  HGB 10.2*  HCT 32.3*  MCV 80.3  PLT 185    Cardiac Enzymes: No results for input(s): CKTOTAL, CKMB,  CKMBINDEX, TROPONINI in the last 168 hours.  BNP (last 3 results) No results for input(s): BNP in the last 8760 hours.  ProBNP (last 3 results) No results for input(s): PROBNP in the last 8760 hours.  Radiological Exams: No results found.  Assessment/Plan Principal Problem:   Acute on chronic respiratory failure with hypoxia (HCC) Active Problems:   Disorders of diaphragm   Atrial fibrillation (HCC)   Incomplete quadriplegia at C5-6 level (HCC)   History of stroke   1. Acute on chronic respiratory failure with hypoxia continue weaning on pressure support we will let her rest today and restart tomorrow 2. Diaphragmatic paralysis stable at this time 3. Chronic atrial fibrillation rate is controlled 4. Incomplete quadriplegia C5-6 unchanged 5. History of stroke continue with therapy as tolerated   I have personally seen and evaluated the patient, evaluated laboratory and imaging results, formulated the assessment and plan and placed orders. The Patient requires high complexity decision making for assessment and support.  Case was discussed on Rounds with the Respiratory Therapy Staff  Allyne Gee, MD [facility] Pulmonary Critical Care Medicine Sleep Medicine

## 2017-12-17 LAB — BASIC METABOLIC PANEL
Anion gap: 11 (ref 5–15)
BUN: 21 mg/dL (ref 8–23)
CHLORIDE: 101 mmol/L (ref 98–111)
CO2: 26 mmol/L (ref 22–32)
CREATININE: 0.32 mg/dL — AB (ref 0.44–1.00)
Calcium: 8.9 mg/dL (ref 8.9–10.3)
GFR calc Af Amer: >60 mL/min (ref >60)
GFR calc non Af Amer: >60 mL/min (ref >60)
GLUCOSE: 87 mg/dL (ref 70–99)
Potassium: 4.5 mmol/L (ref 3.5–5.1)
Sodium: 138 mmol/L (ref 135–145)

## 2017-12-17 LAB — CBC
HEMATOCRIT: 32.5 % — AB (ref 36.0–46.0)
HEMOGLOBIN: 10.6 g/dL — AB (ref 12.0–15.0)
MCH: 25.4 pg — AB (ref 26.0–34.0)
MCHC: 32.6 g/dL (ref 30.0–36.0)
MCV: 77.8 fL — AB (ref 78.0–100.0)
Platelets: 193 10*3/uL (ref 150–400)
RBC: 4.18 MIL/uL (ref 3.87–5.11)
RDW: 21.6 % — ABNORMAL HIGH (ref 11.5–15.5)
WBC: 7.7 10*3/uL (ref 4.0–10.5)

## 2017-12-17 NOTE — Progress Notes (Signed)
Pulmonary Critical Care Medicine [place]   PULMONARY CRITICAL CARE SERVICE  PROGRESS NOTE  Date of Service: 12/17/2017  CHI WOODHAM  [id number]:[id number]  DOB: 11/15/1945   DOA: 09/18/2017  Referring Physician: Merton Border, MD  HPI: Lindsay Mills is a 72 y.o. female seen for follow up of Acute on Chronic Respiratory Failure.  She is on pressure support wean 16/5 this time  Medications: Reviewed on Rounds  Physical Exam:  Vitals: Temperature 97.2 pulse 80 respiratory rate 18 blood pressure 112/61 saturations 100%  Ventilator Settings mode of ventilation pressure support FiO2 28% tidal volume 300 pressure support 16 PEEP 5  . General: Comfortable at this time . Eyes: Grossly normal lids, irises & conjunctiva . ENT: grossly tongue is normal . Neck: no obvious mass . Cardiovascular: S1 S2 normal no gallop . Respiratory: No rhonchi coarse breath sounds . Abdomen: soft . Skin: no rash seen on limited exam . Musculoskeletal: not rigid . Psychiatric:unable to assess . Neurologic: no seizure no involuntary movements         Lab Data:   Basic Metabolic Panel: Recent Labs  Lab 12/12/17 0755 12/13/17 0522 12/17/17 0702  NA 136 137 138  K 3.8 3.9 4.5  CL 99 102 101  CO2 26 26 26   GLUCOSE 103* 102* 87  BUN 15 21 21   CREATININE 0.32* 0.33* 0.32*  CALCIUM 9.3 8.9 8.9  MG 1.9 2.0  --   PHOS 3.2 3.3  --     ABG: Recent Labs  Lab 12/16/17 0805  PHART 7.543*  PCO2ART 33.0  PO2ART 121*  HCO3 28.4*  O2SAT 98.7    Liver Function Tests: No results for input(s): AST, ALT, ALKPHOS, BILITOT, PROT, ALBUMIN in the last 168 hours. No results for input(s): LIPASE, AMYLASE in the last 168 hours. No results for input(s): AMMONIA in the last 168 hours.  CBC: Recent Labs  Lab 12/12/17 0755 12/17/17 0702  WBC 6.7 7.7  HGB 10.2* 10.6*  HCT 32.3* 32.5*  MCV 80.3 77.8*  PLT 185 193    Cardiac Enzymes: No results for input(s): CKTOTAL, CKMB,  CKMBINDEX, TROPONINI in the last 168 hours.  BNP (last 3 results) No results for input(s): BNP in the last 8760 hours.  ProBNP (last 3 results) No results for input(s): PROBNP in the last 8760 hours.  Radiological Exams: No results found.  Assessment/Plan Principal Problem:   Acute on chronic respiratory failure with hypoxia (HCC) Active Problems:   Disorders of diaphragm   Atrial fibrillation (HCC)   Incomplete quadriplegia at C5-6 level (HCC)   History of stroke   1. Acute on chronic respiratory failure with hypoxia we will continue to wean on the pressure support today continue aggressive pulmonary toilet supportive care. 2. Diaphragmatic paralysis unchanged she still has significant weakness. 3. Chronic atrial fibrillation rate is controlled 4. Incomplete quadriplegia continue with supportive care 5. History of stroke at baseline   I have personally seen and evaluated the patient, evaluated laboratory and imaging results, formulated the assessment and plan and placed orders. The Patient requires high complexity decision making for assessment and support.  Case was discussed on Rounds with the Respiratory Therapy Staff  Allyne Gee, MD [facility] Pulmonary Critical Care Medicine Sleep Medicine

## 2017-12-18 NOTE — Progress Notes (Signed)
Pulmonary Critical Care Medicine [place]   PULMONARY CRITICAL CARE SERVICE  PROGRESS NOTE  Date of Service: 12/18/2017  Lindsay Mills  [id number]  DOB: 11-13-1945   DOA: 09/18/2017  Referring Physician: Merton Border, MD  HPI: Lindsay Mills is a 72 y.o. female seen for follow up of Acute on Chronic Respiratory Failure.  She did not do very well today weaning on pressure support.  She had low volumes so right now is resting on pressure control mode  Medications: Reviewed on Rounds  Physical Exam:  Vitals: Temperature 97.6 pulse 93 respiratory rate 10 blood pressure 113/57 saturation 91%  Ventilator Settings on the ventilator pressure assist control mode 28% FiO2 tidal volume about 347 PEEP 5  . General: Comfortable at this time . Eyes: Grossly normal lids, irises & conjunctiva . ENT: grossly tongue is normal . Neck: no obvious mass . Cardiovascular: S1 S2 normal no gallop . Respiratory: No rhonchi no rales . Abdomen: soft . Skin: no rash seen on limited exam . Musculoskeletal: not rigid . Psychiatric:unable to assess . Neurologic: no seizure no involuntary movements         Lab Data:   Basic Metabolic Panel: Recent Labs  Lab 12/12/17 0755 12/13/17 0522 12/17/17 0702  NA 136 137 138  K 3.8 3.9 4.5  CL 99 102 101  CO2 26 26 26   GLUCOSE 103* 102* 87  BUN 15 21 21   CREATININE 0.32* 0.33* 0.32*  CALCIUM 9.3 8.9 8.9  MG 1.9 2.0  --   PHOS 3.2 3.3  --     ABG: Recent Labs  Lab 12/16/17 0805  PHART 7.543*  PCO2ART 33.0  PO2ART 121*  HCO3 28.4*  O2SAT 98.7    Liver Function Tests: No results for input(s): AST, ALT, ALKPHOS, BILITOT, PROT, ALBUMIN in the last 168 hours. No results for input(s): LIPASE, AMYLASE in the last 168 hours. No results for input(s): AMMONIA in the last 168 hours.  CBC: Recent Labs  Lab 12/12/17 0755 12/17/17 0702  WBC 6.7 7.7  HGB 10.2* 10.6*  HCT 32.3* 32.5*  MCV 80.3 77.8*  PLT 185 193     Cardiac Enzymes: No results for input(s): CKTOTAL, CKMB, CKMBINDEX, TROPONINI in the last 168 hours.  BNP (last 3 results) No results for input(s): BNP in the last 8760 hours.  ProBNP (last 3 results) No results for input(s): PROBNP in the last 8760 hours.  Radiological Exams: No results found.  Assessment/Plan Principal Problem:   Acute on chronic respiratory failure with hypoxia (HCC) Active Problems:   Disorders of diaphragm   Atrial fibrillation (HCC)   Incomplete quadriplegia at C5-6 level (HCC)   History of stroke   1. Acute on chronic respiratory failure with hypoxia patient not tolerating weaning today we will reassess again tomorrow and if able to do advanced her pressure support wean 16/5 again. 2. Diaphragmatic paralysis at baseline continue with supportive care 3. Chronic atrial fibrillation rate is controlled 4. C5-6 quad stable continue present management 5. History of stroke at baseline continue supportive care   I have personally seen and evaluated the patient, evaluated laboratory and imaging results, formulated the assessment and plan and placed orders. The Patient requires high complexity decision making for assessment and support.  Case was discussed on Rounds with the Respiratory Therapy Staff  Allyne Gee, MD [facility] Pulmonary Critical Care Medicine Sleep Medicine

## 2017-12-19 NOTE — Progress Notes (Signed)
Pulmonary Critical Care Medicine [place]   PULMONARY CRITICAL CARE SERVICE  PROGRESS NOTE  Date of Service: 12/19/2017  Lindsay Mills  [id number]  DOB: Jun 03, 1945   DOA: 09/18/2017  Referring Physician: Merton Border, MD  HPI: Lindsay Mills is a 72 y.o. female seen for follow up of Acute on Chronic Respiratory Failure.  Patient is having a difficult time weaning she was not able to tolerate the pressure support weaning this morning.  Was placed back on pressure assist control mode currently is on 28% oxygen tidal volume of 400 PEEP 5  Medications: Reviewed on Rounds  Physical Exam:  Vitals: Temperature 99.7 pulse 97 respiratory rate 14 blood pressure 149/70 saturations 100%  Ventilator Settings mode of ventilation pressure assist control tidal volume 400 PEEP 5 FiO2 28%  . General: Comfortable at this time . Eyes: Grossly normal lids, irises & conjunctiva . ENT: grossly tongue is normal . Neck: no obvious mass . Cardiovascular: S1 S2 normal no gallop . Respiratory: No rhonchi or rales are noted . Abdomen: soft . Skin: no rash seen on limited exam . Musculoskeletal: not rigid . Psychiatric:unable to assess . Neurologic: no seizure no involuntary movements         Lab Data:   Basic Metabolic Panel: Recent Labs  Lab 12/13/17 0522 12/17/17 0702  NA 137 138  K 3.9 4.5  CL 102 101  CO2 26 26  GLUCOSE 102* 87  BUN 21 21  CREATININE 0.33* 0.32*  CALCIUM 8.9 8.9  MG 2.0  --   PHOS 3.3  --     ABG: Recent Labs  Lab 12/16/17 0805  PHART 7.543*  PCO2ART 33.0  PO2ART 121*  HCO3 28.4*  O2SAT 98.7    Liver Function Tests: No results for input(s): AST, ALT, ALKPHOS, BILITOT, PROT, ALBUMIN in the last 168 hours. No results for input(s): LIPASE, AMYLASE in the last 168 hours. No results for input(s): AMMONIA in the last 168 hours.  CBC: Recent Labs  Lab 12/17/17 0702  WBC 7.7  HGB 10.6*  HCT 32.5*  MCV 77.8*  PLT 193     Cardiac Enzymes: No results for input(s): CKTOTAL, CKMB, CKMBINDEX, TROPONINI in the last 168 hours.  BNP (last 3 results) No results for input(s): BNP in the last 8760 hours.  ProBNP (last 3 results) No results for input(s): PROBNP in the last 8760 hours.  Radiological Exams: No results found.  Assessment/Plan Principal Problem:   Acute on chronic respiratory failure with hypoxia (HCC) Active Problems:   Disorders of diaphragm   Atrial fibrillation (HCC)   Incomplete quadriplegia at C5-6 level (HCC)   History of stroke   1. Acute on chronic respiratory failure with hypoxia continue with assessing the spontaneous breathing index patient unable to tolerate weaning today we will reassess again tomorrow and let her rest for today 2. Diaphragmatic paralysis limiting factor as far as being able to wean along with her C5-6 quadriplegia. 3. Chronic atrial fibrillation rate is controlled we will continue with supportive care 4. History of stroke therapy as tolerated out of bed as tolerated   I have personally seen and evaluated the patient, evaluated laboratory and imaging results, formulated the assessment and plan and placed orders. The Patient requires high complexity decision making for assessment and support.  Case was discussed on Rounds with the Respiratory Therapy Staff  Allyne Gee, MD [facility] Pulmonary Critical Care Medicine Sleep Medicine

## 2017-12-20 LAB — BASIC METABOLIC PANEL
ANION GAP: 12 (ref 5–15)
BUN: 18 mg/dL (ref 8–23)
CHLORIDE: 96 mmol/L — AB (ref 98–111)
CO2: 29 mmol/L (ref 22–32)
Calcium: 9 mg/dL (ref 8.9–10.3)
Creatinine, Ser: 0.35 mg/dL — ABNORMAL LOW (ref 0.44–1.00)
GFR calc Af Amer: >60 mL/min (ref >60)
GFR calc non Af Amer: >60 mL/min (ref >60)
Glucose, Bld: 99 mg/dL (ref 70–99)
POTASSIUM: 3.8 mmol/L (ref 3.5–5.1)
Sodium: 137 mmol/L (ref 135–145)

## 2017-12-20 NOTE — Progress Notes (Signed)
Pulmonary Critical Care Medicine [place]   PULMONARY CRITICAL CARE SERVICE  PROGRESS NOTE  Date of Service: 12/20/2017  KALISI BEVILL  [id number]  DOB: 09-23-1945   DOA: 09/18/2017  Referring Physician: Merton Border, MD  HPI: Lindsay Mills is a 72 y.o. female seen for follow up of Acute on Chronic Respiratory Failure.  She continues to do poorly as far as being able to wean.  Right now she is back on the ventilator and pressure assist control mode  Medications: Reviewed on Rounds  Physical Exam:  Vitals: Temperature 98.0 pulse 114 respiratory rate 34 blood pressure 151/95 saturation 95%  Ventilator Settings mode of ventilation pressure assist control FiO2 20% tidal volume 389 PEEP 5  . General: Comfortable at this time . Eyes: Grossly normal lids, irises & conjunctiva . ENT: grossly tongue is normal . Neck: no obvious mass . Cardiovascular: S1 S2 normal no gallop . Respiratory: Good air entry no rhonchi rales . Abdomen: soft . Skin: no rash seen on limited exam . Musculoskeletal: not rigid . Psychiatric:unable to assess . Neurologic: no seizure no involuntary movements         Lab Data:   Basic Metabolic Panel: Recent Labs  Lab 12/17/17 0702 12/20/17 0747  NA 138 137  K 4.5 3.8  CL 101 96*  CO2 26 29  GLUCOSE 87 99  BUN 21 18  CREATININE 0.32* 0.35*  CALCIUM 8.9 9.0    ABG: Recent Labs  Lab 12/16/17 0805  PHART 7.543*  PCO2ART 33.0  PO2ART 121*  HCO3 28.4*  O2SAT 98.7    Liver Function Tests: No results for input(s): AST, ALT, ALKPHOS, BILITOT, PROT, ALBUMIN in the last 168 hours. No results for input(s): LIPASE, AMYLASE in the last 168 hours. No results for input(s): AMMONIA in the last 168 hours.  CBC: Recent Labs  Lab 12/17/17 0702  WBC 7.7  HGB 10.6*  HCT 32.5*  MCV 77.8*  PLT 193    Cardiac Enzymes: No results for input(s): CKTOTAL, CKMB, CKMBINDEX, TROPONINI in the last 168 hours.  BNP (last 3  results) No results for input(s): BNP in the last 8760 hours.  ProBNP (last 3 results) No results for input(s): PROBNP in the last 8760 hours.  Radiological Exams: No results found.  Assessment/Plan Principal Problem:   Acute on chronic respiratory failure with hypoxia (HCC) Active Problems:   Disorders of diaphragm   Atrial fibrillation (HCC)   Incomplete quadriplegia at C5-6 level (HCC)   History of stroke   1. Acute on chronic respiratory failure with hypoxia continue with the pressure assist control mode at this time.  Continue aggressive pulmonary toilet supportive care. 2. Diaphragmatic paralysis at baseline 3. Chronic atrial fibrillation rate control 4. Incomplete quadriplegia at baseline 5. History of stroke continue with restorative therapy   I have personally seen and evaluated the patient, evaluated laboratory and imaging results, formulated the assessment and plan and placed orders. The Patient requires high complexity decision making for assessment and support.  Case was discussed on Rounds with the Respiratory Therapy Staff  Allyne Gee, MD [facility] Pulmonary Critical Care Medicine Sleep Medicine

## 2017-12-21 LAB — C DIFFICILE QUICK SCREEN W PCR REFLEX
C Diff antigen: NEGATIVE
C Diff interpretation: NOT DETECTED
C Diff toxin: NEGATIVE

## 2017-12-23 LAB — BASIC METABOLIC PANEL
Anion gap: 9 (ref 5–15)
BUN: 24 mg/dL — ABNORMAL HIGH (ref 8–23)
CALCIUM: 8.5 mg/dL — AB (ref 8.9–10.3)
CHLORIDE: 97 mmol/L — AB (ref 98–111)
CO2: 28 mmol/L (ref 22–32)
CREATININE: 0.31 mg/dL — AB (ref 0.44–1.00)
GFR calc Af Amer: >60 mL/min (ref >60)
GFR calc non Af Amer: >60 mL/min (ref >60)
Glucose, Bld: 109 mg/dL — ABNORMAL HIGH (ref 70–99)
Potassium: 3.6 mmol/L (ref 3.5–5.1)
Sodium: 134 mmol/L — ABNORMAL LOW (ref 135–145)

## 2017-12-23 LAB — MAGNESIUM: Magnesium: 2 mg/dL (ref 1.7–2.4)

## 2017-12-24 LAB — CBC
HEMATOCRIT: 27.4 % — AB (ref 36.0–46.0)
HEMOGLOBIN: 8.6 g/dL — AB (ref 12.0–15.0)
MCH: 25 pg — AB (ref 26.0–34.0)
MCHC: 31.4 g/dL (ref 30.0–36.0)
MCV: 79.7 fL (ref 78.0–100.0)
Platelets: 182 10*3/uL (ref 150–400)
RBC: 3.44 MIL/uL — ABNORMAL LOW (ref 3.87–5.11)
RDW: 20.9 % — ABNORMAL HIGH (ref 11.5–15.5)
WBC: 9 10*3/uL (ref 4.0–10.5)

## 2017-12-24 NOTE — Progress Notes (Signed)
Pulmonary Critical Care Medicine [place]   PULMONARY CRITICAL CARE SERVICE  PROGRESS NOTE  Date of Service: 12/24/2017  Lindsay Mills  [id number]  DOB: 1945-05-18   DOA: 09/18/2017  Referring Physician: Merton Border, MD  HPI: Lindsay Mills is a 72 y.o. female seen for follow up of Acute on Chronic Respiratory Failure.  Patient right now is on full support has not been tolerating the weaning at this time.  Medications: Reviewed on Rounds  Physical Exam:  Vitals: Temperature 97.0 pulse 104 respiratory rate 12 blood pressure 110/60 saturations 100%  Ventilator Settings mode of ventilation pressure assist control FiO2 20% tidal volume 350 PEEP 5  . General: Comfortable at this time . Eyes: Grossly normal lids, irises & conjunctiva . ENT: grossly tongue is normal . Neck: no obvious mass . Cardiovascular: S1 S2 normal no gallop . Respiratory: Coarse breath sounds no rhonchi are noted . Abdomen: soft . Skin: no rash seen on limited exam . Musculoskeletal: not rigid . Psychiatric:unable to assess . Neurologic: no seizure no involuntary movements         Lab Data:   Basic Metabolic Panel: Recent Labs  Lab 12/20/17 0747 12/23/17 0733  NA 137 134*  K 3.8 3.6  CL 96* 97*  CO2 29 28  GLUCOSE 99 109*  BUN 18 24*  CREATININE 0.35* 0.31*  CALCIUM 9.0 8.5*  MG  --  2.0    ABG: No results for input(s): PHART, PCO2ART, PO2ART, HCO3, O2SAT in the last 168 hours.  Liver Function Tests: No results for input(s): AST, ALT, ALKPHOS, BILITOT, PROT, ALBUMIN in the last 168 hours. No results for input(s): LIPASE, AMYLASE in the last 168 hours. No results for input(s): AMMONIA in the last 168 hours.  CBC: Recent Labs  Lab 12/24/17 0649  WBC 9.0  HGB 8.6*  HCT 27.4*  MCV 79.7  PLT 182    Cardiac Enzymes: No results for input(s): CKTOTAL, CKMB, CKMBINDEX, TROPONINI in the last 168 hours.  BNP (last 3 results) No results for input(s):  BNP in the last 8760 hours.  ProBNP (last 3 results) No results for input(s): PROBNP in the last 8760 hours.  Radiological Exams: No results found.  Assessment/Plan Principal Problem:   Acute on chronic respiratory failure with hypoxia (HCC) Active Problems:   Disorders of diaphragm   Atrial fibrillation (HCC)   Incomplete quadriplegia at C5-6 level (HCC)   History of stroke   1. Acute on chronic respiratory failure with hypoxia we will continue with full support on pressure assist control.  Continue aggressive pulmonary toilet supportive care.  We will continue to check the spontaneous breathing trial however I think the window of opportunity for weaning has come and gone 2. Diaphragmatic paralysis we will continue with supportive care 3. Chronic atrial fibrillation rate is controlled we will follow 4. Incomplete quadriplegia C5-6 main factor along with diaphragmatic paralysis limiting [place] from weaning 5. History of stroke continue with restorative therapy   I have personally seen and evaluated the patient, evaluated laboratory and imaging results, formulated the assessment and plan and placed orders. The Patient requires high complexity decision making for assessment and support.  Case was discussed on Rounds with the Respiratory Therapy Staff  Allyne Gee, MD [facility] Pulmonary Critical Care Medicine Sleep Medicine

## 2017-12-25 NOTE — Progress Notes (Signed)
Pulmonary Critical Care Medicine [place]   PULMONARY CRITICAL CARE SERVICE  PROGRESS NOTE  Date of Service: 12/25/2017  Lindsay Mills  [id number]  DOB: 10/29/1945   DOA: 09/18/2017  Referring Physician: Merton Border, MD  HPI: Lindsay Mills is a 72 y.o. female seen for follow up of Acute on Chronic Respiratory Failure.  Currently she is at baseline on pressure control mode.  She has not been tolerating any weaning attempts.  Remains on 28% FiO2  Medications: Reviewed on Rounds  Physical Exam:  Vitals: Temperature 98.0 pulse 112 respiratory rate 18 blood pressure 113/60 saturations 100%  Ventilator Settings mode of ventilation assist control FiO2 28% tidal volume 350 PEEP 5  . General: Comfortable at this time . Eyes: Grossly normal lids, irises & conjunctiva . ENT: grossly tongue is normal . Neck: no obvious mass . Cardiovascular: S1 S2 normal no gallop . Respiratory: Good air entry no rhonchi or rales are noted at this time. . Abdomen: soft . Skin: no rash seen on limited exam . Musculoskeletal: not rigid . Psychiatric:unable to assess . Neurologic: no seizure no involuntary movements         Lab Data:   Basic Metabolic Panel: Recent Labs  Lab 12/20/17 0747 12/23/17 0733  NA 137 134*  K 3.8 3.6  CL 96* 97*  CO2 29 28  GLUCOSE 99 109*  BUN 18 24*  CREATININE 0.35* 0.31*  CALCIUM 9.0 8.5*  MG  --  2.0    ABG: No results for input(s): PHART, PCO2ART, PO2ART, HCO3, O2SAT in the last 168 hours.  Liver Function Tests: No results for input(s): AST, ALT, ALKPHOS, BILITOT, PROT, ALBUMIN in the last 168 hours. No results for input(s): LIPASE, AMYLASE in the last 168 hours. No results for input(s): AMMONIA in the last 168 hours.  CBC: Recent Labs  Lab 12/24/17 0649  WBC 9.0  HGB 8.6*  HCT 27.4*  MCV 79.7  PLT 182    Cardiac Enzymes: No results for input(s): CKTOTAL, CKMB, CKMBINDEX, TROPONINI in the last 168  hours.  BNP (last 3 results) No results for input(s): BNP in the last 8760 hours.  ProBNP (last 3 results) No results for input(s): PROBNP in the last 8760 hours.  Radiological Exams: No results found.  Assessment/Plan Principal Problem:   Acute on chronic respiratory failure with hypoxia (HCC) Active Problems:   Disorders of diaphragm   Atrial fibrillation (HCC)   Incomplete quadriplegia at C5-6 level (HCC)   History of stroke   1. Acute on chronic respiratory failure with hypoxia we will continue with full vent support she is failing weaning attempts at this time.  She is planning on going home with the ventilator according to discharge planning. 2. Diaphragmatic paralysis continue with full support 3. Atrial fibrillation rate is controlled we will monitor 4. Incomplete quadriplegia C5-6 supportive care restorative therapy 5. History of stroke at baseline   I have personally seen and evaluated the patient, evaluated laboratory and imaging results, formulated the assessment and plan and placed orders. The Patient requires high complexity decision making for assessment and support.  Case was discussed on Rounds with the Respiratory Therapy Staff  Allyne Gee, MD [facility] Pulmonary Critical Care Medicine Sleep Medicine

## 2017-12-26 NOTE — Progress Notes (Signed)
Pulmonary Critical Care Medicine [place]   PULMONARY CRITICAL CARE SERVICE  PROGRESS NOTE  Date of Service: 12/26/2017  Lindsay Mills  [id number]:[id number]  DOB: 1945-06-16   DOA: 09/18/2017  Referring Physician: Merton Border, MD  HPI: Lindsay Mills is a 72 y.o. female seen for follow up of Acute on Chronic Respiratory Failure.  Patient is on full vent support.  She will be getting home training as the plan is for her to go home with the ventilator  Medications: Reviewed on Rounds  Physical Exam:  Vitals: Temperature 98.0 pulse 104 respiratory rate 20 blood pressure 134/64 saturations 100%  Ventilator Settings patient is on pressure assist control mode on 28% FiO2  . General: Comfortable at this time . Eyes: Grossly normal lids, irises & conjunctiva . ENT: grossly tongue is normal . Neck: no obvious mass . Cardiovascular: S1 S2 normal no gallop . Respiratory: No rhonchi or rales are noted at this time . Abdomen: soft . Skin: no rash seen on limited exam . Musculoskeletal: not rigid . Psychiatric:unable to assess . Neurologic: no seizure no involuntary movements         Lab Data:   Basic Metabolic Panel: Recent Labs  Lab 12/20/17 0747 12/23/17 0733  NA 137 134*  K 3.8 3.6  CL 96* 97*  CO2 29 28  GLUCOSE 99 109*  BUN 18 24*  CREATININE 0.35* 0.31*  CALCIUM 9.0 8.5*  MG  --  2.0    ABG: No results for input(s): PHART, PCO2ART, PO2ART, HCO3, O2SAT in the last 168 hours.  Liver Function Tests: No results for input(s): AST, ALT, ALKPHOS, BILITOT, PROT, ALBUMIN in the last 168 hours. No results for input(s): LIPASE, AMYLASE in the last 168 hours. No results for input(s): AMMONIA in the last 168 hours.  CBC: Recent Labs  Lab 12/24/17 0649  WBC 9.0  HGB 8.6*  HCT 27.4*  MCV 79.7  PLT 182    Cardiac Enzymes: No results for input(s): CKTOTAL, CKMB, CKMBINDEX, TROPONINI in the last 168 hours.  BNP (last 3 results) No results  for input(s): BNP in the last 8760 hours.  ProBNP (last 3 results) No results for input(s): PROBNP in the last 8760 hours.  Radiological Exams: No results found.  Assessment/Plan Principal Problem:   Acute on chronic respiratory failure with hypoxia (HCC) Active Problems:   Disorders of diaphragm   Atrial fibrillation (HCC)   Incomplete quadriplegia at C5-6 level (HCC)   History of stroke   1. Acute on chronic respiratory failure with hypoxia continue with full support on the ventilator patient is currently on pressure assist control mode she is not wean able. 2. Diaphragmatic paralysis stable at this time continue present management 3. Chronic atrial fibrillation rate is controlled 4. Incomplete quadriplegia stable and C5-6 level 5. History of stroke we will continue present management   I have personally seen and evaluated the patient, evaluated laboratory and imaging results, formulated the assessment and plan and placed orders. The Patient requires high complexity decision making for assessment and support.  Case was discussed on Rounds with the Respiratory Therapy Staff  Allyne Gee, MD [facility] Pulmonary Critical Care Medicine Sleep Medicine

## 2017-12-27 LAB — BASIC METABOLIC PANEL
ANION GAP: 11 (ref 5–15)
BUN: 27 mg/dL — ABNORMAL HIGH (ref 8–23)
CO2: 29 mmol/L (ref 22–32)
Calcium: 8.7 mg/dL — ABNORMAL LOW (ref 8.9–10.3)
Chloride: 96 mmol/L — ABNORMAL LOW (ref 98–111)
Creatinine, Ser: 0.4 mg/dL — ABNORMAL LOW (ref 0.44–1.00)
GFR calc Af Amer: >60 mL/min (ref >60)
GFR calc non Af Amer: >60 mL/min (ref >60)
Glucose, Bld: 103 mg/dL — ABNORMAL HIGH (ref 70–99)
POTASSIUM: 4.5 mmol/L (ref 3.5–5.1)
SODIUM: 136 mmol/L (ref 135–145)

## 2017-12-27 LAB — CBC
HCT: 32 % — ABNORMAL LOW (ref 36.0–46.0)
Hemoglobin: 10.2 g/dL — ABNORMAL LOW (ref 12.0–15.0)
MCH: 24.9 pg — ABNORMAL LOW (ref 26.0–34.0)
MCHC: 31.9 g/dL (ref 30.0–36.0)
MCV: 78 fL (ref 78.0–100.0)
PLATELETS: 213 10*3/uL (ref 150–400)
RBC: 4.1 MIL/uL (ref 3.87–5.11)
RDW: 20.8 % — ABNORMAL HIGH (ref 11.5–15.5)
WBC: 10.1 10*3/uL (ref 4.0–10.5)

## 2017-12-27 LAB — PHOSPHORUS: Phosphorus: 3.6 mg/dL (ref 2.5–4.6)

## 2017-12-27 LAB — MAGNESIUM: MAGNESIUM: 1.9 mg/dL (ref 1.7–2.4)

## 2017-12-27 NOTE — Progress Notes (Signed)
Pulmonary Critical Care Medicine [place]   PULMONARY CRITICAL CARE SERVICE  PROGRESS NOTE  Date of Service: 12/27/2017  Lindsay Mills  VPX:[id number]  DOB: [birth year]   DOA: 09/18/2017  Referring Physician: Merton Border, MD  HPI: Lindsay Mills is a 72 y.o. female seen for follow up of Acute on Chronic Respiratory Failure.  Patient is on full vent support training is underway for home ventilator  Medications: Reviewed on Rounds  Physical Exam:  Vitals: Temperature 98.7 pulse 87 respiratory 24 blood pressure 127/88 saturations 96%  Ventilator Settings mode of ventilation pressure assist control FiO2 28% tidal volume 400 PEEP 5 driving pressure 25  . General: Comfortable at this time . Eyes: Grossly normal lids, irises & conjunctiva . ENT: grossly tongue is normal . Neck: no obvious mass . Cardiovascular: S1 S2 normal no gallop . Respiratory: Coarse rhonchi no rales are noted . Abdomen: soft . Skin: no rash seen on limited exam . Musculoskeletal: not rigid . Psychiatric:unable to assess . Neurologic: no seizure no involuntary movements         Lab Data:   Basic Metabolic Panel: Recent Labs  Lab 12/23/17 0733 12/27/17 0612  NA 134* 136  K 3.6 4.5  CL 97* 96*  CO2 28 29  GLUCOSE 109* 103*  BUN 24* 27*  CREATININE 0.31* 0.40*  CALCIUM 8.5* 8.7*  MG 2.0 1.9  PHOS  --  3.6    ABG: No results for input(s): PHART, PCO2ART, PO2ART, HCO3, O2SAT in the last 168 hours.  Liver Function Tests: No results for input(s): AST, ALT, ALKPHOS, BILITOT, PROT, ALBUMIN in the last 168 hours. No results for input(s): LIPASE, AMYLASE in the last 168 hours. No results for input(s): AMMONIA in the last 168 hours.  CBC: Recent Labs  Lab 12/24/17 0649 12/27/17 0612  WBC 9.0 10.1  HGB 8.6* 10.2*  HCT 27.4* 32.0*  MCV 79.7 78.0  PLT 182 213    Cardiac Enzymes: No results for input(s): CKTOTAL, CKMB, CKMBINDEX, TROPONINI in the last 168  hours.  BNP (last 3 results) No results for input(s): BNP in the last 8760 hours.  ProBNP (last 3 results) No results for input(s): PROBNP in the last 8760 hours.  Radiological Exams: No results found.  Assessment/Plan Principal Problem:   Acute on chronic respiratory failure with hypoxia (HCC) Active Problems:   Disorders of diaphragm   Atrial fibrillation (HCC)   Incomplete quadriplegia at C5-6 level (HCC)   History of stroke   1. Acute on chronic respiratory failure with hypoxia we will continue full vent support.  As mentioned above home treatment underway patient should be able to be discharged once home treatment is complete 2. Diaphragmatic paralysis at baseline 3. Chronic atrial fibrillation rate is controlled no issues 4. C5-6 quadriplegic continue with supportive care restorative therapy 5. History of stroke continue with restorative care therapy as tolerated   I have personally seen and evaluated the patient, evaluated laboratory and imaging results, formulated the assessment and plan and placed orders. The Patient requires high complexity decision making for assessment and support.  Case was discussed on Rounds with the Respiratory Therapy Staff  Allyne Gee, MD [facility] Pulmonary Critical Care Medicine Sleep Medicine

## 2017-12-28 NOTE — Progress Notes (Signed)
Pulmonary Critical Care Medicine [place]   PULMONARY CRITICAL CARE SERVICE  PROGRESS NOTE  Date of Service: 12/28/2017  Lindsay Mills  OAC:[id number]  DOB: 12/18/1945   DOA: 09/18/2017  Referring Physician: Merton Border, MD  HPI: Lindsay Mills is a 72 y.o. female seen for follow up of Acute on Chronic Respiratory Failure.  Patient is undergoing home training has the home ventilator in the room apparently last night the husband was trained did fairly well  Medications: Reviewed on Rounds  Physical Exam:  Vitals: Temperature 97.9 pulse 94 respiratory rate 18 blood pressure 146/81 saturations 99%  Ventilator Settings mode of ventilation pressure assist control FiO2 20% tidal volume 46 PEEP 5  . General: Comfortable at this time . Eyes: Grossly normal lids, irises & conjunctiva . ENT: grossly tongue is normal . Neck: no obvious mass . Cardiovascular: S1 S2 normal no gallop . Respiratory: Coarse breath sounds no rhonchi are noted . Abdomen: soft . Skin: no rash seen on limited exam . Musculoskeletal: not rigid . Psychiatric:unable to assess . Neurologic: no seizure no involuntary movements         Lab Data:   Basic Metabolic Panel: Recent Labs  Lab 12/23/17 0733 12/27/17 0612  NA 134* 136  K 3.6 4.5  CL 97* 96*  CO2 28 29  GLUCOSE 109* 103*  BUN 24* 27*  CREATININE 0.31* 0.40*  CALCIUM 8.5* 8.7*  MG 2.0 1.9  PHOS  --  3.6    ABG: No results for input(s): PHART, PCO2ART, PO2ART, HCO3, O2SAT in the last 168 hours.  Liver Function Tests: No results for input(s): AST, ALT, ALKPHOS, BILITOT, PROT, ALBUMIN in the last 168 hours. No results for input(s): LIPASE, AMYLASE in the last 168 hours. No results for input(s): AMMONIA in the last 168 hours.  CBC: Recent Labs  Lab 12/24/17 0649 12/27/17 0612  WBC 9.0 10.1  HGB 8.6* 10.2*  HCT 27.4* 32.0*  MCV 79.7 78.0  PLT 182 213    Cardiac Enzymes: No results for input(s):  CKTOTAL, CKMB, CKMBINDEX, TROPONINI in the last 168 hours.  BNP (last 3 results) No results for input(s): BNP in the last 8760 hours.  ProBNP (last 3 results) No results for input(s): PROBNP in the last 8760 hours.  Radiological Exams: No results found.  Assessment/Plan Principal Problem:   Acute on chronic respiratory failure with hypoxia (HCC) Active Problems:   Disorders of diaphragm   Atrial fibrillation (HCC)   Incomplete quadriplegia at C5-6 level (HCC)   History of stroke   1. Acute on chronic respiratory failure with hypoxia continue on full support patient's on pressure assist control FiO2 28% tidal volume 486 PEEP 5 2. Diaphragmatic paralysis at baseline continue with supportive care 3. Chronic atrial fibrillation rate is controlled 4. Incomplete quadriplegia C5-6 continue with supportive care 5. History of stroke restorative therapy as tolerated   I have personally seen and evaluated the patient, evaluated laboratory and imaging results, formulated the assessment and plan and placed orders. The Patient requires high complexity decision making for assessment and support.  Case was discussed on Rounds with the Respiratory Therapy Staff  Allyne Gee, MD [facility] Pulmonary Critical Care Medicine Sleep Medicine

## 2017-12-29 ENCOUNTER — Other Ambulatory Visit (HOSPITAL_COMMUNITY): Payer: Self-pay

## 2017-12-29 DIAGNOSIS — R0602 Shortness of breath: Secondary | ICD-10-CM | POA: Diagnosis not present

## 2017-12-29 LAB — URINALYSIS, ROUTINE W REFLEX MICROSCOPIC
Bilirubin Urine: NEGATIVE
GLUCOSE, UA: NEGATIVE mg/dL
Hgb urine dipstick: NEGATIVE
KETONES UR: NEGATIVE mg/dL
NITRITE: NEGATIVE
PROTEIN: NEGATIVE mg/dL
Specific Gravity, Urine: 1.015 (ref 1.005–1.030)
pH: 7 (ref 5.0–8.0)

## 2017-12-29 NOTE — Progress Notes (Signed)
Pulmonary Critical Care Medicine [facility] [place]   PULMONARY CRITICAL CARE SERVICE  PROGRESS NOTE  Date of Service: 12/29/2017  Lindsay Mills  [id number]  DOB: 1945/07/14   DOA: 09/18/2017  Referring Physician: Carron Curie, MD  HPI: Lindsay Mills is a 72 y.o. female seen for follow up of Acute on Chronic Respiratory Failure.  Patient is on pressure assist control mode full support has been getting training for home ventilator doing fairly well so far  Medications: Reviewed on Rounds  Physical Exam:  Vitals: Temperature 99.9 pulse 130 respiratory rate 20 blood pressure 102/58 saturations 99%  Ventilator Settings mode of ventilation is pressure assist control FiO2 28% tidal volume 488 PEEP 5  . General: Comfortable at this time . Eyes: Grossly normal lids, irises & conjunctiva . ENT: grossly tongue is normal . Neck: no obvious mass . Cardiovascular: S1 S2 normal no gallop . Respiratory: Coarse breath sounds no rhonchi at this time . Abdomen: soft . Skin: no rash seen on limited exam . Musculoskeletal: not rigid . Psychiatric:unable to assess . Neurologic: no seizure no involuntary movements         Lab Data:   Basic Metabolic Panel: Recent Labs  Lab 12/23/17 0733 12/27/17 0612  NA 134* 136  K 3.6 4.5  CL 97* 96*  CO2 28 29  GLUCOSE 109* 103*  BUN 24* 27*  CREATININE 0.31* 0.40*  CALCIUM 8.5* 8.7*  MG 2.0 1.9  PHOS  --  3.6    ABG: No results for input(s): PHART, PCO2ART, PO2ART, HCO3, O2SAT in the last 168 hours.  Liver Function Tests: No results for input(s): AST, ALT, ALKPHOS, BILITOT, PROT, ALBUMIN in the last 168 hours. No results for input(s): LIPASE, AMYLASE in the last 168 hours. No results for input(s): AMMONIA in the last 168 hours.  CBC: Recent Labs  Lab 12/24/17 0649 12/27/17 0612  WBC 9.0 10.1  HGB 8.6* 10.2*  HCT 27.4* 32.0*  MCV 79.7 78.0  PLT 182 213    Cardiac Enzymes: No results for input(s):  CKTOTAL, CKMB, CKMBINDEX, TROPONINI in the last 168 hours.  BNP (last 3 results) No results for input(s): BNP in the last 8760 hours.  ProBNP (last 3 results) No results for input(s): PROBNP in the last 8760 hours.  Radiological Exams: Dg Chest Port 1 View  Result Date: 12/29/2017 CLINICAL DATA:  Shortness of breath. EXAM: PORTABLE CHEST 1 VIEW COMPARISON:  12/13/2017 and prior radiographs FINDINGS: Cardiomediastinal silhouette is unchanged. A tracheostomy tube, thoracolumbar surgical hardware and loop recorder again noted. Mild LEFT basilar atelectasis/scarring again noted. There is no evidence of focal airspace disease, pulmonary edema, suspicious pulmonary nodule/mass, pleural effusion, or pneumothorax. No acute bony abnormalities are identified. IMPRESSION: No acute abnormality. Electronically Signed   By: Harmon Pier M.D.   On: 12/29/2017 18:34    Assessment/Plan Principal Problem:   Acute on chronic respiratory failure with hypoxia (HCC) Active Problems:   Disorders of diaphragm   Atrial fibrillation (HCC)   Incomplete quadriplegia at C5-6 level (HCC)   History of stroke   1. Acute on chronic respiratory failure with hypoxia we will continue with full support on the ventilator she is getting home training for discharge 2. Diaphragmatic paralysis at baseline 3. Chronic atrial fibrillation rate is controlled 4. Incomplete quadriplegia C5-6 continue with present therapy. 5. History of stroke rehab as tolerated   I have personally seen and evaluated the patient, evaluated laboratory and imaging results, formulated the assessment and plan and  placed orders. The Patient requires high complexity decision making for assessment and support.  Case was discussed on Rounds with the Respiratory Therapy Staff  Yevonne Pax, MD [facility] Pulmonary Critical Care Medicine Sleep Medicine

## 2017-12-30 LAB — CBC
HCT: 28.8 % — ABNORMAL LOW (ref 36.0–46.0)
HEMOGLOBIN: 9 g/dL — AB (ref 12.0–15.0)
MCH: 24.8 pg — AB (ref 26.0–34.0)
MCHC: 31.3 g/dL (ref 30.0–36.0)
MCV: 79.3 fL (ref 78.0–100.0)
Platelets: 272 10*3/uL (ref 150–400)
RBC: 3.63 MIL/uL — ABNORMAL LOW (ref 3.87–5.11)
RDW: 20.3 % — ABNORMAL HIGH (ref 11.5–15.5)
WBC: 11.1 10*3/uL — ABNORMAL HIGH (ref 4.0–10.5)

## 2017-12-30 LAB — BASIC METABOLIC PANEL
ANION GAP: 10 (ref 5–15)
BUN: 22 mg/dL (ref 8–23)
CALCIUM: 8.6 mg/dL — AB (ref 8.9–10.3)
CO2: 29 mmol/L (ref 22–32)
Chloride: 97 mmol/L — ABNORMAL LOW (ref 98–111)
Creatinine, Ser: 0.33 mg/dL — ABNORMAL LOW (ref 0.44–1.00)
GLUCOSE: 114 mg/dL — AB (ref 70–99)
POTASSIUM: 3.8 mmol/L (ref 3.5–5.1)
SODIUM: 136 mmol/L (ref 135–145)

## 2017-12-30 LAB — PHOSPHORUS: Phosphorus: 3.5 mg/dL (ref 2.5–4.6)

## 2017-12-30 LAB — MAGNESIUM: MAGNESIUM: 2 mg/dL (ref 1.7–2.4)

## 2017-12-30 NOTE — Progress Notes (Signed)
Pulmonary Critical Care Medicine [facility] [place]   PULMONARY CRITICAL CARE SERVICE  PROGRESS NOTE  Date of Service: 12/30/2017  Lindsay Mills  ZOX:[id number]  DOB: 1945-08-28   DOA: 09/18/2017  Referring Physician: Carron Curie, MD  HPI: Lindsay Mills is a 72 y.o. female seen for follow up of Acute on Chronic Respiratory Failure.  Patient is on pressure assist control mode full support right now on 35% FiO2 with volumes of 300  Medications: Reviewed on Rounds  Physical Exam:  Vitals: Temperature 98.0 pulse 89 respiratory rate 21 blood pressure 107/64 saturations 98%  Ventilator Settings mode of ventilation pressure assist control FiO2 35% tidal line 300 PEEP 5  . General: Comfortable at this time . Eyes: Grossly normal lids, irises & conjunctiva . ENT: grossly tongue is normal . Neck: no obvious mass . Cardiovascular: S1 S2 normal no gallop . Respiratory: Few rhonchi are noted at this time . Abdomen: soft . Skin: no rash seen on limited exam . Musculoskeletal: not rigid . Psychiatric:unable to assess . Neurologic: no seizure no involuntary movements         Lab Data:   Basic Metabolic Panel: Recent Labs  Lab 12/27/17 0612 12/30/17 0611  NA 136 136  K 4.5 3.8  CL 96* 97*  CO2 29 29  GLUCOSE 103* 114*  BUN 27* 22  CREATININE 0.40* 0.33*  CALCIUM 8.7* 8.6*  MG 1.9 2.0  PHOS 3.6 3.5    ABG: No results for input(s): PHART, PCO2ART, PO2ART, HCO3, O2SAT in the last 168 hours.  Liver Function Tests: No results for input(s): AST, ALT, ALKPHOS, BILITOT, PROT, ALBUMIN in the last 168 hours. No results for input(s): LIPASE, AMYLASE in the last 168 hours. No results for input(s): AMMONIA in the last 168 hours.  CBC: Recent Labs  Lab 12/24/17 0649 12/27/17 0612 12/30/17 0611  WBC 9.0 10.1 11.1*  HGB 8.6* 10.2* 9.0*  HCT 27.4* 32.0* 28.8*  MCV 79.7 78.0 79.3  PLT 182 213 272    Cardiac Enzymes: No results for input(s): CKTOTAL,  CKMB, CKMBINDEX, TROPONINI in the last 168 hours.  BNP (last 3 results) No results for input(s): BNP in the last 8760 hours.  ProBNP (last 3 results) No results for input(s): PROBNP in the last 8760 hours.  Radiological Exams: Dg Chest Port 1 View  Result Date: 12/29/2017 CLINICAL DATA:  Shortness of breath. EXAM: PORTABLE CHEST 1 VIEW COMPARISON:  12/13/2017 and prior radiographs FINDINGS: Cardiomediastinal silhouette is unchanged. A tracheostomy tube, thoracolumbar surgical hardware and loop recorder again noted. Mild LEFT basilar atelectasis/scarring again noted. There is no evidence of focal airspace disease, pulmonary edema, suspicious pulmonary nodule/mass, pleural effusion, or pneumothorax. No acute bony abnormalities are identified. IMPRESSION: No acute abnormality. Electronically Signed   By: Harmon Pier M.D.   On: 12/29/2017 18:34    Assessment/Plan Principal Problem:   Acute on chronic respiratory failure with hypoxia (HCC) Active Problems:   Disorders of diaphragm   Atrial fibrillation (HCC)   Incomplete quadriplegia at C5-6 level (HCC)   History of stroke   1. Acute on chronic respiratory failure with hypoxia we will continue with full vent support on pressure assist control mode.  Continue secretion management pulmonary toilet. 2. Diaphragmatic paralysis at baseline 3. Chronic atrial fibrillation rate is controlled 4. C5-6 quad continue with present management 5. History of stroke physical therapy as tolerated   I have personally seen and evaluated the patient, evaluated laboratory and imaging results, formulated the assessment and  plan and placed orders. The Patient requires high complexity decision making for assessment and support.  Case was discussed on Rounds with the Respiratory Therapy Staff  Yevonne Pax, MD [facility] Pulmonary Critical Care Medicine Sleep Medicine

## 2017-12-31 LAB — CULTURE, RESPIRATORY

## 2017-12-31 LAB — CULTURE, RESPIRATORY W GRAM STAIN

## 2017-12-31 NOTE — Progress Notes (Signed)
Pulmonary Critical Care Medicine [facility] [place]   PULMONARY CRITICAL CARE SERVICE  PROGRESS NOTE  Date of Service: 12/31/2017  Lindsay Mills  ZOX:[id number]  DOB: 18-Jun-1945   DOA: 09/18/2017  Referring Physician: Carron Curie, MD  HPI: Lindsay Mills is a 72 y.o. female seen for follow up of Acute on Chronic Respiratory Failure.  Patient has been tolerating the ventilator doing fairly well.  Awaiting discharge after development of a urinary tract infection.  Right now is on the ventilator and full support  Medications: Reviewed on Rounds  Physical Exam:  Vitals: Temperature 97.8 pulse 84 respiratory rate 30 blood pressure 145/77 saturations 100%  Ventilator Settings mode of ventilation pressure assist control FiO2 is 28%  . General: Comfortable at this time . Eyes: Grossly normal lids, irises & conjunctiva . ENT: grossly tongue is normal . Neck: no obvious mass . Cardiovascular: S1 S2 normal no gallop . Respiratory: No rhonchi or rales are noted at this time. . Abdomen: soft . Skin: no rash seen on limited exam . Musculoskeletal: not rigid . Psychiatric:unable to assess . Neurologic: no seizure no involuntary movements         Lab Data:   Basic Metabolic Panel: Recent Labs  Lab 12/27/17 0612 12/30/17 0611  NA 136 136  K 4.5 3.8  CL 96* 97*  CO2 29 29  GLUCOSE 103* 114*  BUN 27* 22  CREATININE 0.40* 0.33*  CALCIUM 8.7* 8.6*  MG 1.9 2.0  PHOS 3.6 3.5    ABG: No results for input(s): PHART, PCO2ART, PO2ART, HCO3, O2SAT in the last 168 hours.  Liver Function Tests: No results for input(s): AST, ALT, ALKPHOS, BILITOT, PROT, ALBUMIN in the last 168 hours. No results for input(s): LIPASE, AMYLASE in the last 168 hours. No results for input(s): AMMONIA in the last 168 hours.  CBC: Recent Labs  Lab 12/27/17 0612 12/30/17 0611  WBC 10.1 11.1*  HGB 10.2* 9.0*  HCT 32.0* 28.8*  MCV 78.0 79.3  PLT 213 272    Cardiac Enzymes: No  results for input(s): CKTOTAL, CKMB, CKMBINDEX, TROPONINI in the last 168 hours.  BNP (last 3 results) No results for input(s): BNP in the last 8760 hours.  ProBNP (last 3 results) No results for input(s): PROBNP in the last 8760 hours.  Radiological Exams: Dg Chest Port 1 View  Result Date: 12/29/2017 CLINICAL DATA:  Shortness of breath. EXAM: PORTABLE CHEST 1 VIEW COMPARISON:  12/13/2017 and prior radiographs FINDINGS: Cardiomediastinal silhouette is unchanged. A tracheostomy tube, thoracolumbar surgical hardware and loop recorder again noted. Mild LEFT basilar atelectasis/scarring again noted. There is no evidence of focal airspace disease, pulmonary edema, suspicious pulmonary nodule/mass, pleural effusion, or pneumothorax. No acute bony abnormalities are identified. IMPRESSION: No acute abnormality. Electronically Signed   By: Harmon Pier M.D.   On: 12/29/2017 18:34    Assessment/Plan Principal Problem:   Acute on chronic respiratory failure with hypoxia (HCC) Active Problems:   Disorders of diaphragm   Atrial fibrillation (HCC)   Incomplete quadriplegia at C5-6 level (HCC)   History of stroke   1. Acute on chronic respiratory failure with hypoxia we will continue with the full vent support on current settings.  Continue pulmonary toilet secretion management prognosis remains guarded. 2. Diaphragmatic paralysis needs ongoing vent support.  We will continue supportive care 3. Atrial fibrillation rate is controlled we will monitor 4. Incomplete quadriplegia C5-6 she is at baseline 5. History of stroke stable continue with therapy as tolerated  I have personally seen and evaluated the patient, evaluated laboratory and imaging results, formulated the assessment and plan and placed orders. The Patient requires high complexity decision making for assessment and support.  Case was discussed on Rounds with the Respiratory Therapy Staff  Yevonne Pax, MD [facility] Pulmonary Critical Care  Medicine Sleep Medicine Patient is on the ventilator full support she is on pressure assist control mode at this time and has been tolerating the home ventilator awaiting discharge plan since now she has a urinary tract infection

## 2018-01-02 DIAGNOSIS — S12400D Unspecified displaced fracture of fifth cervical vertebra, subsequent encounter for fracture with routine healing: Secondary | ICD-10-CM | POA: Diagnosis not present

## 2018-01-02 DIAGNOSIS — S14105S Unspecified injury at C5 level of cervical spinal cord, sequela: Secondary | ICD-10-CM | POA: Diagnosis not present

## 2018-01-02 DIAGNOSIS — I951 Orthostatic hypotension: Secondary | ICD-10-CM | POA: Diagnosis not present

## 2018-01-02 DIAGNOSIS — S12100A Unspecified displaced fracture of second cervical vertebra, initial encounter for closed fracture: Secondary | ICD-10-CM | POA: Diagnosis not present

## 2018-01-02 DIAGNOSIS — I639 Cerebral infarction, unspecified: Secondary | ICD-10-CM | POA: Diagnosis not present

## 2018-01-02 DIAGNOSIS — G8254 Quadriplegia, C5-C7 incomplete: Secondary | ICD-10-CM | POA: Diagnosis not present

## 2018-01-02 DIAGNOSIS — I509 Heart failure, unspecified: Secondary | ICD-10-CM | POA: Diagnosis not present

## 2018-01-02 DIAGNOSIS — Z930 Tracheostomy status: Secondary | ICD-10-CM | POA: Diagnosis not present

## 2018-01-02 DIAGNOSIS — S12500D Unspecified displaced fracture of sixth cervical vertebra, subsequent encounter for fracture with routine healing: Secondary | ICD-10-CM | POA: Diagnosis not present

## 2018-01-02 DIAGNOSIS — S82892A Other fracture of left lower leg, initial encounter for closed fracture: Secondary | ICD-10-CM | POA: Diagnosis not present

## 2018-01-02 DIAGNOSIS — J9621 Acute and chronic respiratory failure with hypoxia: Secondary | ICD-10-CM | POA: Diagnosis not present

## 2018-01-02 DIAGNOSIS — L89154 Pressure ulcer of sacral region, stage 4: Secondary | ICD-10-CM | POA: Diagnosis not present

## 2018-01-02 DIAGNOSIS — R1310 Dysphagia, unspecified: Secondary | ICD-10-CM | POA: Diagnosis not present

## 2018-01-02 DIAGNOSIS — I4891 Unspecified atrial fibrillation: Secondary | ICD-10-CM | POA: Diagnosis not present

## 2018-01-02 DIAGNOSIS — I5023 Acute on chronic systolic (congestive) heart failure: Secondary | ICD-10-CM | POA: Diagnosis not present

## 2018-01-02 DIAGNOSIS — Z931 Gastrostomy status: Secondary | ICD-10-CM | POA: Diagnosis not present

## 2018-01-02 DIAGNOSIS — R55 Syncope and collapse: Secondary | ICD-10-CM | POA: Diagnosis not present

## 2018-01-02 LAB — URINE CULTURE: Culture: >=100000 — AB

## 2018-01-03 DIAGNOSIS — L89154 Pressure ulcer of sacral region, stage 4: Secondary | ICD-10-CM | POA: Diagnosis not present

## 2018-01-03 DIAGNOSIS — Z930 Tracheostomy status: Secondary | ICD-10-CM | POA: Diagnosis not present

## 2018-01-03 DIAGNOSIS — Z931 Gastrostomy status: Secondary | ICD-10-CM | POA: Diagnosis not present

## 2018-01-03 DIAGNOSIS — S12400D Unspecified displaced fracture of fifth cervical vertebra, subsequent encounter for fracture with routine healing: Secondary | ICD-10-CM | POA: Diagnosis not present

## 2018-01-03 DIAGNOSIS — S14105S Unspecified injury at C5 level of cervical spinal cord, sequela: Secondary | ICD-10-CM | POA: Diagnosis not present

## 2018-01-03 DIAGNOSIS — R1310 Dysphagia, unspecified: Secondary | ICD-10-CM | POA: Diagnosis not present

## 2018-01-03 DIAGNOSIS — G8254 Quadriplegia, C5-C7 incomplete: Secondary | ICD-10-CM | POA: Diagnosis not present

## 2018-01-03 DIAGNOSIS — I4891 Unspecified atrial fibrillation: Secondary | ICD-10-CM | POA: Diagnosis not present

## 2018-01-03 DIAGNOSIS — S12500D Unspecified displaced fracture of sixth cervical vertebra, subsequent encounter for fracture with routine healing: Secondary | ICD-10-CM | POA: Diagnosis not present

## 2018-01-07 DIAGNOSIS — L89154 Pressure ulcer of sacral region, stage 4: Secondary | ICD-10-CM | POA: Diagnosis not present

## 2018-01-07 DIAGNOSIS — S12500D Unspecified displaced fracture of sixth cervical vertebra, subsequent encounter for fracture with routine healing: Secondary | ICD-10-CM | POA: Diagnosis not present

## 2018-01-07 DIAGNOSIS — I4891 Unspecified atrial fibrillation: Secondary | ICD-10-CM | POA: Diagnosis not present

## 2018-01-07 DIAGNOSIS — Z930 Tracheostomy status: Secondary | ICD-10-CM | POA: Diagnosis not present

## 2018-01-07 DIAGNOSIS — S12400D Unspecified displaced fracture of fifth cervical vertebra, subsequent encounter for fracture with routine healing: Secondary | ICD-10-CM | POA: Diagnosis not present

## 2018-01-07 DIAGNOSIS — G8254 Quadriplegia, C5-C7 incomplete: Secondary | ICD-10-CM | POA: Diagnosis not present

## 2018-01-07 DIAGNOSIS — S14105S Unspecified injury at C5 level of cervical spinal cord, sequela: Secondary | ICD-10-CM | POA: Diagnosis not present

## 2018-01-07 DIAGNOSIS — R1310 Dysphagia, unspecified: Secondary | ICD-10-CM | POA: Diagnosis not present

## 2018-01-07 DIAGNOSIS — Z931 Gastrostomy status: Secondary | ICD-10-CM | POA: Diagnosis not present

## 2018-01-08 DIAGNOSIS — G40909 Epilepsy, unspecified, not intractable, without status epilepticus: Secondary | ICD-10-CM | POA: Diagnosis not present

## 2018-01-08 DIAGNOSIS — W19XXXA Unspecified fall, initial encounter: Secondary | ICD-10-CM | POA: Diagnosis not present

## 2018-01-08 DIAGNOSIS — R079 Chest pain, unspecified: Secondary | ICD-10-CM | POA: Diagnosis not present

## 2018-01-08 DIAGNOSIS — Z7901 Long term (current) use of anticoagulants: Secondary | ICD-10-CM | POA: Diagnosis not present

## 2018-01-08 DIAGNOSIS — J189 Pneumonia, unspecified organism: Secondary | ICD-10-CM | POA: Diagnosis not present

## 2018-01-08 DIAGNOSIS — G8250 Quadriplegia, unspecified: Secondary | ICD-10-CM | POA: Diagnosis not present

## 2018-01-08 DIAGNOSIS — R918 Other nonspecific abnormal finding of lung field: Secondary | ICD-10-CM | POA: Diagnosis not present

## 2018-01-08 DIAGNOSIS — R1310 Dysphagia, unspecified: Secondary | ICD-10-CM | POA: Diagnosis not present

## 2018-01-08 DIAGNOSIS — L89154 Pressure ulcer of sacral region, stage 4: Secondary | ICD-10-CM | POA: Diagnosis not present

## 2018-01-08 DIAGNOSIS — I4891 Unspecified atrial fibrillation: Secondary | ICD-10-CM | POA: Diagnosis not present

## 2018-01-08 DIAGNOSIS — R05 Cough: Secondary | ICD-10-CM | POA: Diagnosis not present

## 2018-01-08 DIAGNOSIS — S12400D Unspecified displaced fracture of fifth cervical vertebra, subsequent encounter for fracture with routine healing: Secondary | ICD-10-CM | POA: Diagnosis not present

## 2018-01-08 DIAGNOSIS — R9389 Abnormal findings on diagnostic imaging of other specified body structures: Secondary | ICD-10-CM | POA: Diagnosis not present

## 2018-01-08 DIAGNOSIS — Z8673 Personal history of transient ischemic attack (TIA), and cerebral infarction without residual deficits: Secondary | ICD-10-CM | POA: Diagnosis not present

## 2018-01-08 DIAGNOSIS — I517 Cardiomegaly: Secondary | ICD-10-CM | POA: Diagnosis not present

## 2018-01-08 DIAGNOSIS — R0602 Shortness of breath: Secondary | ICD-10-CM | POA: Diagnosis not present

## 2018-01-08 DIAGNOSIS — S12500D Unspecified displaced fracture of sixth cervical vertebra, subsequent encounter for fracture with routine healing: Secondary | ICD-10-CM | POA: Diagnosis not present

## 2018-01-08 DIAGNOSIS — Z930 Tracheostomy status: Secondary | ICD-10-CM | POA: Diagnosis not present

## 2018-01-08 DIAGNOSIS — R9431 Abnormal electrocardiogram [ECG] [EKG]: Secondary | ICD-10-CM | POA: Diagnosis not present

## 2018-01-08 DIAGNOSIS — M1991 Primary osteoarthritis, unspecified site: Secondary | ICD-10-CM | POA: Diagnosis not present

## 2018-01-08 DIAGNOSIS — I951 Orthostatic hypotension: Secondary | ICD-10-CM | POA: Diagnosis not present

## 2018-01-08 DIAGNOSIS — R0902 Hypoxemia: Secondary | ICD-10-CM | POA: Diagnosis not present

## 2018-01-08 DIAGNOSIS — S14105S Unspecified injury at C5 level of cervical spinal cord, sequela: Secondary | ICD-10-CM | POA: Diagnosis not present

## 2018-01-08 DIAGNOSIS — N281 Cyst of kidney, acquired: Secondary | ICD-10-CM | POA: Diagnosis not present

## 2018-01-08 DIAGNOSIS — S82892A Other fracture of left lower leg, initial encounter for closed fracture: Secondary | ICD-10-CM | POA: Diagnosis not present

## 2018-01-08 DIAGNOSIS — Z931 Gastrostomy status: Secondary | ICD-10-CM | POA: Diagnosis not present

## 2018-01-08 DIAGNOSIS — E039 Hypothyroidism, unspecified: Secondary | ICD-10-CM | POA: Diagnosis not present

## 2018-01-08 DIAGNOSIS — R042 Hemoptysis: Secondary | ICD-10-CM | POA: Diagnosis not present

## 2018-01-08 DIAGNOSIS — G8254 Quadriplegia, C5-C7 incomplete: Secondary | ICD-10-CM | POA: Diagnosis not present

## 2018-01-08 DIAGNOSIS — J45909 Unspecified asthma, uncomplicated: Secondary | ICD-10-CM | POA: Diagnosis not present

## 2018-01-09 DIAGNOSIS — R279 Unspecified lack of coordination: Secondary | ICD-10-CM | POA: Diagnosis not present

## 2018-01-09 DIAGNOSIS — R079 Chest pain, unspecified: Secondary | ICD-10-CM | POA: Diagnosis not present

## 2018-01-09 DIAGNOSIS — Z743 Need for continuous supervision: Secondary | ICD-10-CM | POA: Diagnosis not present

## 2018-01-09 DIAGNOSIS — J189 Pneumonia, unspecified organism: Secondary | ICD-10-CM | POA: Diagnosis not present

## 2018-01-09 DIAGNOSIS — R918 Other nonspecific abnormal finding of lung field: Secondary | ICD-10-CM | POA: Diagnosis not present

## 2018-01-09 DIAGNOSIS — N281 Cyst of kidney, acquired: Secondary | ICD-10-CM | POA: Diagnosis not present

## 2018-01-10 DIAGNOSIS — L89154 Pressure ulcer of sacral region, stage 4: Secondary | ICD-10-CM | POA: Diagnosis not present

## 2018-01-10 DIAGNOSIS — Z930 Tracheostomy status: Secondary | ICD-10-CM | POA: Diagnosis not present

## 2018-01-10 DIAGNOSIS — R1310 Dysphagia, unspecified: Secondary | ICD-10-CM | POA: Diagnosis not present

## 2018-01-10 DIAGNOSIS — S12400D Unspecified displaced fracture of fifth cervical vertebra, subsequent encounter for fracture with routine healing: Secondary | ICD-10-CM | POA: Diagnosis not present

## 2018-01-10 DIAGNOSIS — Z931 Gastrostomy status: Secondary | ICD-10-CM | POA: Diagnosis not present

## 2018-01-10 DIAGNOSIS — S12500D Unspecified displaced fracture of sixth cervical vertebra, subsequent encounter for fracture with routine healing: Secondary | ICD-10-CM | POA: Diagnosis not present

## 2018-01-10 DIAGNOSIS — I4891 Unspecified atrial fibrillation: Secondary | ICD-10-CM | POA: Diagnosis not present

## 2018-01-10 DIAGNOSIS — S14105S Unspecified injury at C5 level of cervical spinal cord, sequela: Secondary | ICD-10-CM | POA: Diagnosis not present

## 2018-01-10 DIAGNOSIS — G8254 Quadriplegia, C5-C7 incomplete: Secondary | ICD-10-CM | POA: Diagnosis not present

## 2018-01-11 DIAGNOSIS — I4891 Unspecified atrial fibrillation: Secondary | ICD-10-CM | POA: Diagnosis not present

## 2018-01-11 DIAGNOSIS — Z931 Gastrostomy status: Secondary | ICD-10-CM | POA: Diagnosis not present

## 2018-01-11 DIAGNOSIS — S12400D Unspecified displaced fracture of fifth cervical vertebra, subsequent encounter for fracture with routine healing: Secondary | ICD-10-CM | POA: Diagnosis not present

## 2018-01-11 DIAGNOSIS — S14105S Unspecified injury at C5 level of cervical spinal cord, sequela: Secondary | ICD-10-CM | POA: Diagnosis not present

## 2018-01-11 DIAGNOSIS — R1310 Dysphagia, unspecified: Secondary | ICD-10-CM | POA: Diagnosis not present

## 2018-01-11 DIAGNOSIS — L89154 Pressure ulcer of sacral region, stage 4: Secondary | ICD-10-CM | POA: Diagnosis not present

## 2018-01-11 DIAGNOSIS — G8254 Quadriplegia, C5-C7 incomplete: Secondary | ICD-10-CM | POA: Diagnosis not present

## 2018-01-11 DIAGNOSIS — S12500D Unspecified displaced fracture of sixth cervical vertebra, subsequent encounter for fracture with routine healing: Secondary | ICD-10-CM | POA: Diagnosis not present

## 2018-01-11 DIAGNOSIS — Z930 Tracheostomy status: Secondary | ICD-10-CM | POA: Diagnosis not present

## 2018-01-12 DIAGNOSIS — Z930 Tracheostomy status: Secondary | ICD-10-CM | POA: Diagnosis not present

## 2018-01-12 DIAGNOSIS — I951 Orthostatic hypotension: Secondary | ICD-10-CM | POA: Diagnosis not present

## 2018-01-12 DIAGNOSIS — I639 Cerebral infarction, unspecified: Secondary | ICD-10-CM | POA: Diagnosis not present

## 2018-01-12 DIAGNOSIS — I5023 Acute on chronic systolic (congestive) heart failure: Secondary | ICD-10-CM | POA: Diagnosis not present

## 2018-01-12 DIAGNOSIS — S82892A Other fracture of left lower leg, initial encounter for closed fracture: Secondary | ICD-10-CM | POA: Diagnosis not present

## 2018-01-12 DIAGNOSIS — I509 Heart failure, unspecified: Secondary | ICD-10-CM | POA: Diagnosis not present

## 2018-01-12 DIAGNOSIS — J9621 Acute and chronic respiratory failure with hypoxia: Secondary | ICD-10-CM | POA: Diagnosis not present

## 2018-01-12 DIAGNOSIS — S12100A Unspecified displaced fracture of second cervical vertebra, initial encounter for closed fracture: Secondary | ICD-10-CM | POA: Diagnosis not present

## 2018-01-14 DIAGNOSIS — S14105S Unspecified injury at C5 level of cervical spinal cord, sequela: Secondary | ICD-10-CM | POA: Diagnosis not present

## 2018-01-14 DIAGNOSIS — Z930 Tracheostomy status: Secondary | ICD-10-CM | POA: Diagnosis not present

## 2018-01-14 DIAGNOSIS — L89154 Pressure ulcer of sacral region, stage 4: Secondary | ICD-10-CM | POA: Diagnosis not present

## 2018-01-14 DIAGNOSIS — S12500D Unspecified displaced fracture of sixth cervical vertebra, subsequent encounter for fracture with routine healing: Secondary | ICD-10-CM | POA: Diagnosis not present

## 2018-01-14 DIAGNOSIS — S12400D Unspecified displaced fracture of fifth cervical vertebra, subsequent encounter for fracture with routine healing: Secondary | ICD-10-CM | POA: Diagnosis not present

## 2018-01-14 DIAGNOSIS — G8254 Quadriplegia, C5-C7 incomplete: Secondary | ICD-10-CM | POA: Diagnosis not present

## 2018-01-14 DIAGNOSIS — I4891 Unspecified atrial fibrillation: Secondary | ICD-10-CM | POA: Diagnosis not present

## 2018-01-14 DIAGNOSIS — R1310 Dysphagia, unspecified: Secondary | ICD-10-CM | POA: Diagnosis not present

## 2018-01-14 DIAGNOSIS — Z931 Gastrostomy status: Secondary | ICD-10-CM | POA: Diagnosis not present

## 2018-01-15 DIAGNOSIS — R55 Syncope and collapse: Secondary | ICD-10-CM | POA: Diagnosis not present

## 2018-01-16 DIAGNOSIS — L89154 Pressure ulcer of sacral region, stage 4: Secondary | ICD-10-CM | POA: Diagnosis not present

## 2018-01-16 DIAGNOSIS — S14105S Unspecified injury at C5 level of cervical spinal cord, sequela: Secondary | ICD-10-CM | POA: Diagnosis not present

## 2018-01-16 DIAGNOSIS — I4891 Unspecified atrial fibrillation: Secondary | ICD-10-CM | POA: Diagnosis not present

## 2018-01-16 DIAGNOSIS — S12400D Unspecified displaced fracture of fifth cervical vertebra, subsequent encounter for fracture with routine healing: Secondary | ICD-10-CM | POA: Diagnosis not present

## 2018-01-16 DIAGNOSIS — Z930 Tracheostomy status: Secondary | ICD-10-CM | POA: Diagnosis not present

## 2018-01-16 DIAGNOSIS — S12500D Unspecified displaced fracture of sixth cervical vertebra, subsequent encounter for fracture with routine healing: Secondary | ICD-10-CM | POA: Diagnosis not present

## 2018-01-16 DIAGNOSIS — G8254 Quadriplegia, C5-C7 incomplete: Secondary | ICD-10-CM | POA: Diagnosis not present

## 2018-01-16 DIAGNOSIS — Z931 Gastrostomy status: Secondary | ICD-10-CM | POA: Diagnosis not present

## 2018-01-16 DIAGNOSIS — N390 Urinary tract infection, site not specified: Secondary | ICD-10-CM | POA: Diagnosis not present

## 2018-01-16 DIAGNOSIS — R1310 Dysphagia, unspecified: Secondary | ICD-10-CM | POA: Diagnosis not present

## 2018-01-17 DIAGNOSIS — G8254 Quadriplegia, C5-C7 incomplete: Secondary | ICD-10-CM | POA: Diagnosis not present

## 2018-01-17 DIAGNOSIS — S12500D Unspecified displaced fracture of sixth cervical vertebra, subsequent encounter for fracture with routine healing: Secondary | ICD-10-CM | POA: Diagnosis not present

## 2018-01-17 DIAGNOSIS — L89154 Pressure ulcer of sacral region, stage 4: Secondary | ICD-10-CM | POA: Diagnosis not present

## 2018-01-17 DIAGNOSIS — S14105S Unspecified injury at C5 level of cervical spinal cord, sequela: Secondary | ICD-10-CM | POA: Diagnosis not present

## 2018-01-17 DIAGNOSIS — M4322 Fusion of spine, cervical region: Secondary | ICD-10-CM | POA: Diagnosis not present

## 2018-01-17 DIAGNOSIS — I4891 Unspecified atrial fibrillation: Secondary | ICD-10-CM | POA: Diagnosis not present

## 2018-01-17 DIAGNOSIS — J9621 Acute and chronic respiratory failure with hypoxia: Secondary | ICD-10-CM | POA: Diagnosis not present

## 2018-01-17 DIAGNOSIS — I951 Orthostatic hypotension: Secondary | ICD-10-CM | POA: Diagnosis not present

## 2018-01-17 DIAGNOSIS — I5023 Acute on chronic systolic (congestive) heart failure: Secondary | ICD-10-CM | POA: Diagnosis not present

## 2018-01-17 DIAGNOSIS — S82892A Other fracture of left lower leg, initial encounter for closed fracture: Secondary | ICD-10-CM | POA: Diagnosis not present

## 2018-01-17 DIAGNOSIS — Z930 Tracheostomy status: Secondary | ICD-10-CM | POA: Diagnosis not present

## 2018-01-17 DIAGNOSIS — R1310 Dysphagia, unspecified: Secondary | ICD-10-CM | POA: Diagnosis not present

## 2018-01-17 DIAGNOSIS — Z931 Gastrostomy status: Secondary | ICD-10-CM | POA: Diagnosis not present

## 2018-01-17 DIAGNOSIS — Z9911 Dependence on respirator [ventilator] status: Secondary | ICD-10-CM | POA: Diagnosis not present

## 2018-01-17 DIAGNOSIS — I639 Cerebral infarction, unspecified: Secondary | ICD-10-CM | POA: Diagnosis not present

## 2018-01-17 DIAGNOSIS — S12400D Unspecified displaced fracture of fifth cervical vertebra, subsequent encounter for fracture with routine healing: Secondary | ICD-10-CM | POA: Diagnosis not present

## 2018-01-17 DIAGNOSIS — S12100A Unspecified displaced fracture of second cervical vertebra, initial encounter for closed fracture: Secondary | ICD-10-CM | POA: Diagnosis not present

## 2018-01-17 DIAGNOSIS — I509 Heart failure, unspecified: Secondary | ICD-10-CM | POA: Diagnosis not present

## 2018-01-20 DIAGNOSIS — S82892A Other fracture of left lower leg, initial encounter for closed fracture: Secondary | ICD-10-CM | POA: Diagnosis not present

## 2018-01-20 DIAGNOSIS — I951 Orthostatic hypotension: Secondary | ICD-10-CM | POA: Diagnosis not present

## 2018-01-20 DIAGNOSIS — I509 Heart failure, unspecified: Secondary | ICD-10-CM | POA: Diagnosis not present

## 2018-01-20 DIAGNOSIS — I5023 Acute on chronic systolic (congestive) heart failure: Secondary | ICD-10-CM | POA: Diagnosis not present

## 2018-01-21 DIAGNOSIS — S14105S Unspecified injury at C5 level of cervical spinal cord, sequela: Secondary | ICD-10-CM | POA: Diagnosis not present

## 2018-01-21 DIAGNOSIS — L89154 Pressure ulcer of sacral region, stage 4: Secondary | ICD-10-CM | POA: Diagnosis not present

## 2018-01-21 DIAGNOSIS — R1310 Dysphagia, unspecified: Secondary | ICD-10-CM | POA: Diagnosis not present

## 2018-01-21 DIAGNOSIS — S12400D Unspecified displaced fracture of fifth cervical vertebra, subsequent encounter for fracture with routine healing: Secondary | ICD-10-CM | POA: Diagnosis not present

## 2018-01-21 DIAGNOSIS — Z931 Gastrostomy status: Secondary | ICD-10-CM | POA: Diagnosis not present

## 2018-01-21 DIAGNOSIS — Z930 Tracheostomy status: Secondary | ICD-10-CM | POA: Diagnosis not present

## 2018-01-21 DIAGNOSIS — G8254 Quadriplegia, C5-C7 incomplete: Secondary | ICD-10-CM | POA: Diagnosis not present

## 2018-01-21 DIAGNOSIS — S12500D Unspecified displaced fracture of sixth cervical vertebra, subsequent encounter for fracture with routine healing: Secondary | ICD-10-CM | POA: Diagnosis not present

## 2018-01-21 DIAGNOSIS — I4891 Unspecified atrial fibrillation: Secondary | ICD-10-CM | POA: Diagnosis not present

## 2018-01-21 DIAGNOSIS — Z5181 Encounter for therapeutic drug level monitoring: Secondary | ICD-10-CM | POA: Diagnosis not present

## 2018-01-23 DIAGNOSIS — I4891 Unspecified atrial fibrillation: Secondary | ICD-10-CM | POA: Diagnosis not present

## 2018-01-23 DIAGNOSIS — G8254 Quadriplegia, C5-C7 incomplete: Secondary | ICD-10-CM | POA: Diagnosis not present

## 2018-01-23 DIAGNOSIS — L89154 Pressure ulcer of sacral region, stage 4: Secondary | ICD-10-CM | POA: Diagnosis not present

## 2018-01-23 DIAGNOSIS — S14105S Unspecified injury at C5 level of cervical spinal cord, sequela: Secondary | ICD-10-CM | POA: Diagnosis not present

## 2018-01-23 DIAGNOSIS — S12400D Unspecified displaced fracture of fifth cervical vertebra, subsequent encounter for fracture with routine healing: Secondary | ICD-10-CM | POA: Diagnosis not present

## 2018-01-23 DIAGNOSIS — Z930 Tracheostomy status: Secondary | ICD-10-CM | POA: Diagnosis not present

## 2018-01-23 DIAGNOSIS — Z931 Gastrostomy status: Secondary | ICD-10-CM | POA: Diagnosis not present

## 2018-01-23 DIAGNOSIS — R1310 Dysphagia, unspecified: Secondary | ICD-10-CM | POA: Diagnosis not present

## 2018-01-23 DIAGNOSIS — S12500D Unspecified displaced fracture of sixth cervical vertebra, subsequent encounter for fracture with routine healing: Secondary | ICD-10-CM | POA: Diagnosis not present

## 2018-01-24 DIAGNOSIS — D259 Leiomyoma of uterus, unspecified: Secondary | ICD-10-CM | POA: Diagnosis not present

## 2018-01-24 DIAGNOSIS — Z8673 Personal history of transient ischemic attack (TIA), and cerebral infarction without residual deficits: Secondary | ICD-10-CM | POA: Diagnosis not present

## 2018-01-24 DIAGNOSIS — R404 Transient alteration of awareness: Secondary | ICD-10-CM | POA: Diagnosis not present

## 2018-01-24 DIAGNOSIS — Z930 Tracheostomy status: Secondary | ICD-10-CM | POA: Diagnosis not present

## 2018-01-24 DIAGNOSIS — J45909 Unspecified asthma, uncomplicated: Secondary | ICD-10-CM | POA: Diagnosis not present

## 2018-01-24 DIAGNOSIS — N281 Cyst of kidney, acquired: Secondary | ICD-10-CM | POA: Diagnosis not present

## 2018-01-24 DIAGNOSIS — G40909 Epilepsy, unspecified, not intractable, without status epilepticus: Secondary | ICD-10-CM | POA: Diagnosis not present

## 2018-01-24 DIAGNOSIS — K9423 Gastrostomy malfunction: Secondary | ICD-10-CM | POA: Diagnosis not present

## 2018-01-24 DIAGNOSIS — R9341 Abnormal radiologic findings on diagnostic imaging of renal pelvis, ureter, or bladder: Secondary | ICD-10-CM | POA: Diagnosis not present

## 2018-01-24 DIAGNOSIS — N3090 Cystitis, unspecified without hematuria: Secondary | ICD-10-CM | POA: Diagnosis not present

## 2018-01-24 DIAGNOSIS — M1990 Unspecified osteoarthritis, unspecified site: Secondary | ICD-10-CM | POA: Diagnosis not present

## 2018-01-24 DIAGNOSIS — M419 Scoliosis, unspecified: Secondary | ICD-10-CM | POA: Diagnosis not present

## 2018-01-24 DIAGNOSIS — Z9981 Dependence on supplemental oxygen: Secondary | ICD-10-CM | POA: Diagnosis not present

## 2018-01-24 DIAGNOSIS — R000 Tachycardia, unspecified: Secondary | ICD-10-CM | POA: Diagnosis not present

## 2018-01-24 DIAGNOSIS — E039 Hypothyroidism, unspecified: Secondary | ICD-10-CM | POA: Diagnosis not present

## 2018-01-25 DIAGNOSIS — S12400D Unspecified displaced fracture of fifth cervical vertebra, subsequent encounter for fracture with routine healing: Secondary | ICD-10-CM | POA: Diagnosis not present

## 2018-01-25 DIAGNOSIS — L89154 Pressure ulcer of sacral region, stage 4: Secondary | ICD-10-CM | POA: Diagnosis not present

## 2018-01-25 DIAGNOSIS — I4891 Unspecified atrial fibrillation: Secondary | ICD-10-CM | POA: Diagnosis not present

## 2018-01-25 DIAGNOSIS — N3090 Cystitis, unspecified without hematuria: Secondary | ICD-10-CM | POA: Diagnosis not present

## 2018-01-25 DIAGNOSIS — R1310 Dysphagia, unspecified: Secondary | ICD-10-CM | POA: Diagnosis not present

## 2018-01-25 DIAGNOSIS — S14105S Unspecified injury at C5 level of cervical spinal cord, sequela: Secondary | ICD-10-CM | POA: Diagnosis not present

## 2018-01-25 DIAGNOSIS — Z743 Need for continuous supervision: Secondary | ICD-10-CM | POA: Diagnosis not present

## 2018-01-25 DIAGNOSIS — Z930 Tracheostomy status: Secondary | ICD-10-CM | POA: Diagnosis not present

## 2018-01-25 DIAGNOSIS — Z931 Gastrostomy status: Secondary | ICD-10-CM | POA: Diagnosis not present

## 2018-01-25 DIAGNOSIS — R404 Transient alteration of awareness: Secondary | ICD-10-CM | POA: Diagnosis not present

## 2018-01-25 DIAGNOSIS — S12500D Unspecified displaced fracture of sixth cervical vertebra, subsequent encounter for fracture with routine healing: Secondary | ICD-10-CM | POA: Diagnosis not present

## 2018-01-25 DIAGNOSIS — G8254 Quadriplegia, C5-C7 incomplete: Secondary | ICD-10-CM | POA: Diagnosis not present

## 2018-01-25 DIAGNOSIS — K9423 Gastrostomy malfunction: Secondary | ICD-10-CM | POA: Diagnosis not present

## 2018-01-25 DIAGNOSIS — D259 Leiomyoma of uterus, unspecified: Secondary | ICD-10-CM | POA: Diagnosis not present

## 2018-01-25 DIAGNOSIS — R9341 Abnormal radiologic findings on diagnostic imaging of renal pelvis, ureter, or bladder: Secondary | ICD-10-CM | POA: Diagnosis not present

## 2018-01-25 DIAGNOSIS — R279 Unspecified lack of coordination: Secondary | ICD-10-CM | POA: Diagnosis not present

## 2018-01-25 DIAGNOSIS — R0902 Hypoxemia: Secondary | ICD-10-CM | POA: Diagnosis not present

## 2018-01-26 DIAGNOSIS — R52 Pain, unspecified: Secondary | ICD-10-CM | POA: Diagnosis not present

## 2018-01-26 DIAGNOSIS — Z885 Allergy status to narcotic agent status: Secondary | ICD-10-CM | POA: Diagnosis not present

## 2018-01-26 DIAGNOSIS — R000 Tachycardia, unspecified: Secondary | ICD-10-CM | POA: Diagnosis not present

## 2018-01-26 DIAGNOSIS — Z743 Need for continuous supervision: Secondary | ICD-10-CM | POA: Diagnosis not present

## 2018-01-26 DIAGNOSIS — Z888 Allergy status to other drugs, medicaments and biological substances status: Secondary | ICD-10-CM | POA: Diagnosis not present

## 2018-01-26 DIAGNOSIS — Z431 Encounter for attention to gastrostomy: Secondary | ICD-10-CM | POA: Diagnosis not present

## 2018-01-26 DIAGNOSIS — Z9981 Dependence on supplemental oxygen: Secondary | ICD-10-CM | POA: Diagnosis not present

## 2018-01-26 DIAGNOSIS — I959 Hypotension, unspecified: Secondary | ICD-10-CM | POA: Diagnosis not present

## 2018-01-26 DIAGNOSIS — Z87891 Personal history of nicotine dependence: Secondary | ICD-10-CM | POA: Diagnosis not present

## 2018-01-26 DIAGNOSIS — T85598A Other mechanical complication of other gastrointestinal prosthetic devices, implants and grafts, initial encounter: Secondary | ICD-10-CM | POA: Diagnosis not present

## 2018-01-26 DIAGNOSIS — R5381 Other malaise: Secondary | ICD-10-CM | POA: Diagnosis not present

## 2018-01-26 DIAGNOSIS — K9423 Gastrostomy malfunction: Secondary | ICD-10-CM | POA: Diagnosis not present

## 2018-01-26 DIAGNOSIS — I480 Paroxysmal atrial fibrillation: Secondary | ICD-10-CM | POA: Diagnosis not present

## 2018-01-26 DIAGNOSIS — R279 Unspecified lack of coordination: Secondary | ICD-10-CM | POA: Diagnosis not present

## 2018-01-26 DIAGNOSIS — Z981 Arthrodesis status: Secondary | ICD-10-CM | POA: Diagnosis not present

## 2018-01-26 DIAGNOSIS — J45909 Unspecified asthma, uncomplicated: Secondary | ICD-10-CM | POA: Diagnosis not present

## 2018-01-26 DIAGNOSIS — K5900 Constipation, unspecified: Secondary | ICD-10-CM | POA: Diagnosis not present

## 2018-01-26 DIAGNOSIS — K6389 Other specified diseases of intestine: Secondary | ICD-10-CM | POA: Diagnosis not present

## 2018-01-26 DIAGNOSIS — E039 Hypothyroidism, unspecified: Secondary | ICD-10-CM | POA: Diagnosis not present

## 2018-01-26 DIAGNOSIS — M1990 Unspecified osteoarthritis, unspecified site: Secondary | ICD-10-CM | POA: Diagnosis not present

## 2018-01-27 DIAGNOSIS — L89154 Pressure ulcer of sacral region, stage 4: Secondary | ICD-10-CM | POA: Diagnosis not present

## 2018-01-27 DIAGNOSIS — R1310 Dysphagia, unspecified: Secondary | ICD-10-CM | POA: Diagnosis not present

## 2018-01-27 DIAGNOSIS — I4891 Unspecified atrial fibrillation: Secondary | ICD-10-CM | POA: Diagnosis not present

## 2018-01-27 DIAGNOSIS — I5023 Acute on chronic systolic (congestive) heart failure: Secondary | ICD-10-CM | POA: Diagnosis not present

## 2018-01-27 DIAGNOSIS — L89159 Pressure ulcer of sacral region, unspecified stage: Secondary | ICD-10-CM | POA: Diagnosis not present

## 2018-01-27 DIAGNOSIS — I951 Orthostatic hypotension: Secondary | ICD-10-CM | POA: Diagnosis not present

## 2018-01-27 DIAGNOSIS — S82892A Other fracture of left lower leg, initial encounter for closed fracture: Secondary | ICD-10-CM | POA: Diagnosis not present

## 2018-01-27 DIAGNOSIS — S12500D Unspecified displaced fracture of sixth cervical vertebra, subsequent encounter for fracture with routine healing: Secondary | ICD-10-CM | POA: Diagnosis not present

## 2018-01-27 DIAGNOSIS — Z931 Gastrostomy status: Secondary | ICD-10-CM | POA: Diagnosis not present

## 2018-01-27 DIAGNOSIS — S12400D Unspecified displaced fracture of fifth cervical vertebra, subsequent encounter for fracture with routine healing: Secondary | ICD-10-CM | POA: Diagnosis not present

## 2018-01-27 DIAGNOSIS — S12100A Unspecified displaced fracture of second cervical vertebra, initial encounter for closed fracture: Secondary | ICD-10-CM | POA: Diagnosis not present

## 2018-01-27 DIAGNOSIS — G8254 Quadriplegia, C5-C7 incomplete: Secondary | ICD-10-CM | POA: Diagnosis not present

## 2018-01-27 DIAGNOSIS — I509 Heart failure, unspecified: Secondary | ICD-10-CM | POA: Diagnosis not present

## 2018-01-27 DIAGNOSIS — J9621 Acute and chronic respiratory failure with hypoxia: Secondary | ICD-10-CM | POA: Diagnosis not present

## 2018-01-27 DIAGNOSIS — I639 Cerebral infarction, unspecified: Secondary | ICD-10-CM | POA: Diagnosis not present

## 2018-01-27 DIAGNOSIS — Z930 Tracheostomy status: Secondary | ICD-10-CM | POA: Diagnosis not present

## 2018-01-27 DIAGNOSIS — S14105S Unspecified injury at C5 level of cervical spinal cord, sequela: Secondary | ICD-10-CM | POA: Diagnosis not present

## 2018-01-29 DIAGNOSIS — Z930 Tracheostomy status: Secondary | ICD-10-CM | POA: Diagnosis not present

## 2018-01-29 DIAGNOSIS — S12500D Unspecified displaced fracture of sixth cervical vertebra, subsequent encounter for fracture with routine healing: Secondary | ICD-10-CM | POA: Diagnosis not present

## 2018-01-29 DIAGNOSIS — L89154 Pressure ulcer of sacral region, stage 4: Secondary | ICD-10-CM | POA: Diagnosis not present

## 2018-01-29 DIAGNOSIS — S12400D Unspecified displaced fracture of fifth cervical vertebra, subsequent encounter for fracture with routine healing: Secondary | ICD-10-CM | POA: Diagnosis not present

## 2018-01-29 DIAGNOSIS — G8254 Quadriplegia, C5-C7 incomplete: Secondary | ICD-10-CM | POA: Diagnosis not present

## 2018-01-29 DIAGNOSIS — I4891 Unspecified atrial fibrillation: Secondary | ICD-10-CM | POA: Diagnosis not present

## 2018-01-29 DIAGNOSIS — S14105S Unspecified injury at C5 level of cervical spinal cord, sequela: Secondary | ICD-10-CM | POA: Diagnosis not present

## 2018-01-29 DIAGNOSIS — R1310 Dysphagia, unspecified: Secondary | ICD-10-CM | POA: Diagnosis not present

## 2018-01-29 DIAGNOSIS — Z931 Gastrostomy status: Secondary | ICD-10-CM | POA: Diagnosis not present

## 2018-01-30 DIAGNOSIS — Z931 Gastrostomy status: Secondary | ICD-10-CM | POA: Diagnosis not present

## 2018-01-30 DIAGNOSIS — R1310 Dysphagia, unspecified: Secondary | ICD-10-CM | POA: Diagnosis not present

## 2018-01-30 DIAGNOSIS — I639 Cerebral infarction, unspecified: Secondary | ICD-10-CM | POA: Diagnosis not present

## 2018-01-30 DIAGNOSIS — G8254 Quadriplegia, C5-C7 incomplete: Secondary | ICD-10-CM | POA: Diagnosis not present

## 2018-01-30 DIAGNOSIS — S14105S Unspecified injury at C5 level of cervical spinal cord, sequela: Secondary | ICD-10-CM | POA: Diagnosis not present

## 2018-01-30 DIAGNOSIS — I4891 Unspecified atrial fibrillation: Secondary | ICD-10-CM | POA: Diagnosis not present

## 2018-01-30 DIAGNOSIS — Z930 Tracheostomy status: Secondary | ICD-10-CM | POA: Diagnosis not present

## 2018-01-30 DIAGNOSIS — I5023 Acute on chronic systolic (congestive) heart failure: Secondary | ICD-10-CM | POA: Diagnosis not present

## 2018-01-30 DIAGNOSIS — I951 Orthostatic hypotension: Secondary | ICD-10-CM | POA: Diagnosis not present

## 2018-01-30 DIAGNOSIS — S12100A Unspecified displaced fracture of second cervical vertebra, initial encounter for closed fracture: Secondary | ICD-10-CM | POA: Diagnosis not present

## 2018-01-30 DIAGNOSIS — I509 Heart failure, unspecified: Secondary | ICD-10-CM | POA: Diagnosis not present

## 2018-01-30 DIAGNOSIS — J9621 Acute and chronic respiratory failure with hypoxia: Secondary | ICD-10-CM | POA: Diagnosis not present

## 2018-01-30 DIAGNOSIS — S12500D Unspecified displaced fracture of sixth cervical vertebra, subsequent encounter for fracture with routine healing: Secondary | ICD-10-CM | POA: Diagnosis not present

## 2018-01-30 DIAGNOSIS — S12400D Unspecified displaced fracture of fifth cervical vertebra, subsequent encounter for fracture with routine healing: Secondary | ICD-10-CM | POA: Diagnosis not present

## 2018-01-30 DIAGNOSIS — S82892A Other fracture of left lower leg, initial encounter for closed fracture: Secondary | ICD-10-CM | POA: Diagnosis not present

## 2018-01-30 DIAGNOSIS — L89154 Pressure ulcer of sacral region, stage 4: Secondary | ICD-10-CM | POA: Diagnosis not present

## 2018-01-31 DIAGNOSIS — I639 Cerebral infarction, unspecified: Secondary | ICD-10-CM | POA: Diagnosis not present

## 2018-01-31 DIAGNOSIS — S12100A Unspecified displaced fracture of second cervical vertebra, initial encounter for closed fracture: Secondary | ICD-10-CM | POA: Diagnosis not present

## 2018-01-31 DIAGNOSIS — S82892A Other fracture of left lower leg, initial encounter for closed fracture: Secondary | ICD-10-CM | POA: Diagnosis not present

## 2018-01-31 DIAGNOSIS — I951 Orthostatic hypotension: Secondary | ICD-10-CM | POA: Diagnosis not present

## 2018-01-31 DIAGNOSIS — I509 Heart failure, unspecified: Secondary | ICD-10-CM | POA: Diagnosis not present

## 2018-01-31 DIAGNOSIS — J9621 Acute and chronic respiratory failure with hypoxia: Secondary | ICD-10-CM | POA: Diagnosis not present

## 2018-01-31 DIAGNOSIS — I5023 Acute on chronic systolic (congestive) heart failure: Secondary | ICD-10-CM | POA: Diagnosis not present

## 2018-01-31 DIAGNOSIS — Z930 Tracheostomy status: Secondary | ICD-10-CM | POA: Diagnosis not present

## 2018-02-02 DIAGNOSIS — S82892A Other fracture of left lower leg, initial encounter for closed fracture: Secondary | ICD-10-CM | POA: Diagnosis not present

## 2018-02-02 DIAGNOSIS — I5023 Acute on chronic systolic (congestive) heart failure: Secondary | ICD-10-CM | POA: Diagnosis not present

## 2018-02-02 DIAGNOSIS — I509 Heart failure, unspecified: Secondary | ICD-10-CM | POA: Diagnosis not present

## 2018-02-02 DIAGNOSIS — I951 Orthostatic hypotension: Secondary | ICD-10-CM | POA: Diagnosis not present

## 2018-02-08 DIAGNOSIS — S14105S Unspecified injury at C5 level of cervical spinal cord, sequela: Secondary | ICD-10-CM | POA: Diagnosis not present

## 2018-02-08 DIAGNOSIS — S12400D Unspecified displaced fracture of fifth cervical vertebra, subsequent encounter for fracture with routine healing: Secondary | ICD-10-CM | POA: Diagnosis not present

## 2018-02-08 DIAGNOSIS — I4891 Unspecified atrial fibrillation: Secondary | ICD-10-CM | POA: Diagnosis not present

## 2018-02-08 DIAGNOSIS — R1310 Dysphagia, unspecified: Secondary | ICD-10-CM | POA: Diagnosis not present

## 2018-02-08 DIAGNOSIS — Z931 Gastrostomy status: Secondary | ICD-10-CM | POA: Diagnosis not present

## 2018-02-08 DIAGNOSIS — Z930 Tracheostomy status: Secondary | ICD-10-CM | POA: Diagnosis not present

## 2018-02-08 DIAGNOSIS — S12500D Unspecified displaced fracture of sixth cervical vertebra, subsequent encounter for fracture with routine healing: Secondary | ICD-10-CM | POA: Diagnosis not present

## 2018-02-08 DIAGNOSIS — L89154 Pressure ulcer of sacral region, stage 4: Secondary | ICD-10-CM | POA: Diagnosis not present

## 2018-02-08 DIAGNOSIS — G8254 Quadriplegia, C5-C7 incomplete: Secondary | ICD-10-CM | POA: Diagnosis not present

## 2018-02-12 DIAGNOSIS — Z930 Tracheostomy status: Secondary | ICD-10-CM | POA: Diagnosis not present

## 2018-02-12 DIAGNOSIS — L89154 Pressure ulcer of sacral region, stage 4: Secondary | ICD-10-CM | POA: Diagnosis not present

## 2018-02-12 DIAGNOSIS — I4891 Unspecified atrial fibrillation: Secondary | ICD-10-CM | POA: Diagnosis not present

## 2018-02-12 DIAGNOSIS — R1310 Dysphagia, unspecified: Secondary | ICD-10-CM | POA: Diagnosis not present

## 2018-02-12 DIAGNOSIS — S12500D Unspecified displaced fracture of sixth cervical vertebra, subsequent encounter for fracture with routine healing: Secondary | ICD-10-CM | POA: Diagnosis not present

## 2018-02-12 DIAGNOSIS — S12400D Unspecified displaced fracture of fifth cervical vertebra, subsequent encounter for fracture with routine healing: Secondary | ICD-10-CM | POA: Diagnosis not present

## 2018-02-12 DIAGNOSIS — Z931 Gastrostomy status: Secondary | ICD-10-CM | POA: Diagnosis not present

## 2018-02-12 DIAGNOSIS — S14105S Unspecified injury at C5 level of cervical spinal cord, sequela: Secondary | ICD-10-CM | POA: Diagnosis not present

## 2018-02-12 DIAGNOSIS — G8254 Quadriplegia, C5-C7 incomplete: Secondary | ICD-10-CM | POA: Diagnosis not present

## 2018-02-13 DIAGNOSIS — S82892A Other fracture of left lower leg, initial encounter for closed fracture: Secondary | ICD-10-CM | POA: Diagnosis not present

## 2018-02-13 DIAGNOSIS — I639 Cerebral infarction, unspecified: Secondary | ICD-10-CM | POA: Diagnosis not present

## 2018-02-13 DIAGNOSIS — I951 Orthostatic hypotension: Secondary | ICD-10-CM | POA: Diagnosis not present

## 2018-02-13 DIAGNOSIS — S12100A Unspecified displaced fracture of second cervical vertebra, initial encounter for closed fracture: Secondary | ICD-10-CM | POA: Diagnosis not present

## 2018-02-13 DIAGNOSIS — J9621 Acute and chronic respiratory failure with hypoxia: Secondary | ICD-10-CM | POA: Diagnosis not present

## 2018-02-13 DIAGNOSIS — I509 Heart failure, unspecified: Secondary | ICD-10-CM | POA: Diagnosis not present

## 2018-02-13 DIAGNOSIS — I5023 Acute on chronic systolic (congestive) heart failure: Secondary | ICD-10-CM | POA: Diagnosis not present

## 2018-02-13 DIAGNOSIS — Z930 Tracheostomy status: Secondary | ICD-10-CM | POA: Diagnosis not present

## 2018-02-15 DIAGNOSIS — Z8673 Personal history of transient ischemic attack (TIA), and cerebral infarction without residual deficits: Secondary | ICD-10-CM | POA: Diagnosis not present

## 2018-02-15 DIAGNOSIS — S12500D Unspecified displaced fracture of sixth cervical vertebra, subsequent encounter for fracture with routine healing: Secondary | ICD-10-CM | POA: Diagnosis not present

## 2018-02-15 DIAGNOSIS — Z792 Long term (current) use of antibiotics: Secondary | ICD-10-CM | POA: Diagnosis not present

## 2018-02-15 DIAGNOSIS — Z930 Tracheostomy status: Secondary | ICD-10-CM | POA: Diagnosis not present

## 2018-02-15 DIAGNOSIS — Z931 Gastrostomy status: Secondary | ICD-10-CM | POA: Diagnosis not present

## 2018-02-15 DIAGNOSIS — G8254 Quadriplegia, C5-C7 incomplete: Secondary | ICD-10-CM | POA: Diagnosis not present

## 2018-02-15 DIAGNOSIS — Z9981 Dependence on supplemental oxygen: Secondary | ICD-10-CM | POA: Diagnosis not present

## 2018-02-15 DIAGNOSIS — L89154 Pressure ulcer of sacral region, stage 4: Secondary | ICD-10-CM | POA: Diagnosis not present

## 2018-02-15 DIAGNOSIS — R531 Weakness: Secondary | ICD-10-CM | POA: Diagnosis not present

## 2018-02-15 DIAGNOSIS — I4891 Unspecified atrial fibrillation: Secondary | ICD-10-CM | POA: Diagnosis not present

## 2018-02-15 DIAGNOSIS — N390 Urinary tract infection, site not specified: Secondary | ICD-10-CM | POA: Diagnosis not present

## 2018-02-15 DIAGNOSIS — I10 Essential (primary) hypertension: Secondary | ICD-10-CM | POA: Diagnosis not present

## 2018-02-15 DIAGNOSIS — I480 Paroxysmal atrial fibrillation: Secondary | ICD-10-CM | POA: Diagnosis not present

## 2018-02-15 DIAGNOSIS — R4182 Altered mental status, unspecified: Secondary | ICD-10-CM | POA: Diagnosis not present

## 2018-02-15 DIAGNOSIS — R231 Pallor: Secondary | ICD-10-CM | POA: Diagnosis not present

## 2018-02-15 DIAGNOSIS — Z7951 Long term (current) use of inhaled steroids: Secondary | ICD-10-CM | POA: Diagnosis not present

## 2018-02-15 DIAGNOSIS — E860 Dehydration: Secondary | ICD-10-CM | POA: Diagnosis not present

## 2018-02-15 DIAGNOSIS — Z87828 Personal history of other (healed) physical injury and trauma: Secondary | ICD-10-CM | POA: Diagnosis not present

## 2018-02-15 DIAGNOSIS — S12400D Unspecified displaced fracture of fifth cervical vertebra, subsequent encounter for fracture with routine healing: Secondary | ICD-10-CM | POA: Diagnosis not present

## 2018-02-15 DIAGNOSIS — G40909 Epilepsy, unspecified, not intractable, without status epilepticus: Secondary | ICD-10-CM | POA: Diagnosis not present

## 2018-02-15 DIAGNOSIS — R7989 Other specified abnormal findings of blood chemistry: Secondary | ICD-10-CM | POA: Diagnosis not present

## 2018-02-15 DIAGNOSIS — S14105S Unspecified injury at C5 level of cervical spinal cord, sequela: Secondary | ICD-10-CM | POA: Diagnosis not present

## 2018-02-15 DIAGNOSIS — J45909 Unspecified asthma, uncomplicated: Secondary | ICD-10-CM | POA: Diagnosis not present

## 2018-02-15 DIAGNOSIS — R1310 Dysphagia, unspecified: Secondary | ICD-10-CM | POA: Diagnosis not present

## 2018-02-15 DIAGNOSIS — N281 Cyst of kidney, acquired: Secondary | ICD-10-CM | POA: Diagnosis not present

## 2018-02-16 DIAGNOSIS — R279 Unspecified lack of coordination: Secondary | ICD-10-CM | POA: Diagnosis not present

## 2018-02-16 DIAGNOSIS — R0902 Hypoxemia: Secondary | ICD-10-CM | POA: Diagnosis not present

## 2018-02-16 DIAGNOSIS — Z743 Need for continuous supervision: Secondary | ICD-10-CM | POA: Diagnosis not present

## 2018-02-17 DIAGNOSIS — Z4509 Encounter for adjustment and management of other cardiac device: Secondary | ICD-10-CM | POA: Diagnosis not present

## 2018-02-19 DIAGNOSIS — S12400D Unspecified displaced fracture of fifth cervical vertebra, subsequent encounter for fracture with routine healing: Secondary | ICD-10-CM | POA: Diagnosis not present

## 2018-02-19 DIAGNOSIS — G8254 Quadriplegia, C5-C7 incomplete: Secondary | ICD-10-CM | POA: Diagnosis not present

## 2018-02-19 DIAGNOSIS — I4891 Unspecified atrial fibrillation: Secondary | ICD-10-CM | POA: Diagnosis not present

## 2018-02-19 DIAGNOSIS — R1310 Dysphagia, unspecified: Secondary | ICD-10-CM | POA: Diagnosis not present

## 2018-02-19 DIAGNOSIS — S14105S Unspecified injury at C5 level of cervical spinal cord, sequela: Secondary | ICD-10-CM | POA: Diagnosis not present

## 2018-02-19 DIAGNOSIS — S12500D Unspecified displaced fracture of sixth cervical vertebra, subsequent encounter for fracture with routine healing: Secondary | ICD-10-CM | POA: Diagnosis not present

## 2018-02-19 DIAGNOSIS — L89154 Pressure ulcer of sacral region, stage 4: Secondary | ICD-10-CM | POA: Diagnosis not present

## 2018-02-19 DIAGNOSIS — Z930 Tracheostomy status: Secondary | ICD-10-CM | POA: Diagnosis not present

## 2018-02-19 DIAGNOSIS — Z931 Gastrostomy status: Secondary | ICD-10-CM | POA: Diagnosis not present

## 2018-02-20 DIAGNOSIS — I951 Orthostatic hypotension: Secondary | ICD-10-CM | POA: Diagnosis not present

## 2018-02-20 DIAGNOSIS — I509 Heart failure, unspecified: Secondary | ICD-10-CM | POA: Diagnosis not present

## 2018-02-20 DIAGNOSIS — I5023 Acute on chronic systolic (congestive) heart failure: Secondary | ICD-10-CM | POA: Diagnosis not present

## 2018-02-20 DIAGNOSIS — S82892A Other fracture of left lower leg, initial encounter for closed fracture: Secondary | ICD-10-CM | POA: Diagnosis not present

## 2018-02-22 DIAGNOSIS — K529 Noninfective gastroenteritis and colitis, unspecified: Secondary | ICD-10-CM | POA: Diagnosis not present

## 2018-02-22 DIAGNOSIS — I4891 Unspecified atrial fibrillation: Secondary | ICD-10-CM | POA: Diagnosis not present

## 2018-02-22 DIAGNOSIS — S12500D Unspecified displaced fracture of sixth cervical vertebra, subsequent encounter for fracture with routine healing: Secondary | ICD-10-CM | POA: Diagnosis not present

## 2018-02-22 DIAGNOSIS — Z0001 Encounter for general adult medical examination with abnormal findings: Secondary | ICD-10-CM | POA: Diagnosis not present

## 2018-02-22 DIAGNOSIS — Z931 Gastrostomy status: Secondary | ICD-10-CM | POA: Diagnosis not present

## 2018-02-22 DIAGNOSIS — I6930 Unspecified sequelae of cerebral infarction: Secondary | ICD-10-CM | POA: Diagnosis not present

## 2018-02-22 DIAGNOSIS — G894 Chronic pain syndrome: Secondary | ICD-10-CM | POA: Diagnosis not present

## 2018-02-22 DIAGNOSIS — G8254 Quadriplegia, C5-C7 incomplete: Secondary | ICD-10-CM | POA: Diagnosis not present

## 2018-02-22 DIAGNOSIS — E43 Unspecified severe protein-calorie malnutrition: Secondary | ICD-10-CM | POA: Diagnosis not present

## 2018-02-22 DIAGNOSIS — S12400D Unspecified displaced fracture of fifth cervical vertebra, subsequent encounter for fracture with routine healing: Secondary | ICD-10-CM | POA: Diagnosis not present

## 2018-02-22 DIAGNOSIS — L89324 Pressure ulcer of left buttock, stage 4: Secondary | ICD-10-CM | POA: Diagnosis not present

## 2018-02-22 DIAGNOSIS — S14105S Unspecified injury at C5 level of cervical spinal cord, sequela: Secondary | ICD-10-CM | POA: Diagnosis not present

## 2018-02-22 DIAGNOSIS — Z930 Tracheostomy status: Secondary | ICD-10-CM | POA: Diagnosis not present

## 2018-02-22 DIAGNOSIS — R1310 Dysphagia, unspecified: Secondary | ICD-10-CM | POA: Diagnosis not present

## 2018-02-22 DIAGNOSIS — L89154 Pressure ulcer of sacral region, stage 4: Secondary | ICD-10-CM | POA: Diagnosis not present

## 2018-02-22 DIAGNOSIS — J449 Chronic obstructive pulmonary disease, unspecified: Secondary | ICD-10-CM | POA: Diagnosis not present

## 2018-02-22 DIAGNOSIS — M6281 Muscle weakness (generalized): Secondary | ICD-10-CM | POA: Diagnosis not present

## 2018-02-25 DIAGNOSIS — L89159 Pressure ulcer of sacral region, unspecified stage: Secondary | ICD-10-CM | POA: Diagnosis not present

## 2018-02-25 DIAGNOSIS — J9621 Acute and chronic respiratory failure with hypoxia: Secondary | ICD-10-CM | POA: Diagnosis not present

## 2018-02-25 DIAGNOSIS — S82892A Other fracture of left lower leg, initial encounter for closed fracture: Secondary | ICD-10-CM | POA: Diagnosis not present

## 2018-02-25 DIAGNOSIS — R404 Transient alteration of awareness: Secondary | ICD-10-CM | POA: Diagnosis not present

## 2018-02-25 DIAGNOSIS — Z930 Tracheostomy status: Secondary | ICD-10-CM | POA: Diagnosis not present

## 2018-02-25 DIAGNOSIS — J8410 Pulmonary fibrosis, unspecified: Secondary | ICD-10-CM | POA: Diagnosis not present

## 2018-02-25 DIAGNOSIS — R4182 Altered mental status, unspecified: Secondary | ICD-10-CM | POA: Diagnosis not present

## 2018-02-25 DIAGNOSIS — R5381 Other malaise: Secondary | ICD-10-CM | POA: Diagnosis not present

## 2018-02-25 DIAGNOSIS — G8250 Quadriplegia, unspecified: Secondary | ICD-10-CM | POA: Diagnosis not present

## 2018-02-25 DIAGNOSIS — J9611 Chronic respiratory failure with hypoxia: Secondary | ICD-10-CM | POA: Diagnosis not present

## 2018-02-25 DIAGNOSIS — J984 Other disorders of lung: Secondary | ICD-10-CM | POA: Diagnosis not present

## 2018-02-25 DIAGNOSIS — I5023 Acute on chronic systolic (congestive) heart failure: Secondary | ICD-10-CM | POA: Diagnosis not present

## 2018-02-25 DIAGNOSIS — Z681 Body mass index (BMI) 19 or less, adult: Secondary | ICD-10-CM | POA: Diagnosis not present

## 2018-02-25 DIAGNOSIS — K9422 Gastrostomy infection: Secondary | ICD-10-CM | POA: Diagnosis not present

## 2018-02-25 DIAGNOSIS — I4891 Unspecified atrial fibrillation: Secondary | ICD-10-CM | POA: Diagnosis not present

## 2018-02-25 DIAGNOSIS — Z8619 Personal history of other infectious and parasitic diseases: Secondary | ICD-10-CM | POA: Diagnosis not present

## 2018-02-25 DIAGNOSIS — G9340 Encephalopathy, unspecified: Secondary | ICD-10-CM | POA: Diagnosis not present

## 2018-02-25 DIAGNOSIS — Z9911 Dependence on respirator [ventilator] status: Secondary | ICD-10-CM | POA: Diagnosis not present

## 2018-02-25 DIAGNOSIS — J986 Disorders of diaphragm: Secondary | ICD-10-CM | POA: Diagnosis not present

## 2018-02-25 DIAGNOSIS — J45909 Unspecified asthma, uncomplicated: Secondary | ICD-10-CM | POA: Diagnosis not present

## 2018-02-25 DIAGNOSIS — K9429 Other complications of gastrostomy: Secondary | ICD-10-CM | POA: Diagnosis not present

## 2018-02-25 DIAGNOSIS — R279 Unspecified lack of coordination: Secondary | ICD-10-CM | POA: Diagnosis not present

## 2018-02-25 DIAGNOSIS — Z743 Need for continuous supervision: Secondary | ICD-10-CM | POA: Diagnosis not present

## 2018-02-25 DIAGNOSIS — I639 Cerebral infarction, unspecified: Secondary | ICD-10-CM | POA: Diagnosis not present

## 2018-02-25 DIAGNOSIS — L03311 Cellulitis of abdominal wall: Secondary | ICD-10-CM | POA: Diagnosis not present

## 2018-02-25 DIAGNOSIS — K9421 Gastrostomy hemorrhage: Secondary | ICD-10-CM | POA: Diagnosis not present

## 2018-02-25 DIAGNOSIS — R9401 Abnormal electroencephalogram [EEG]: Secondary | ICD-10-CM | POA: Diagnosis not present

## 2018-02-25 DIAGNOSIS — S12100A Unspecified displaced fracture of second cervical vertebra, initial encounter for closed fracture: Secondary | ICD-10-CM | POA: Diagnosis not present

## 2018-02-25 DIAGNOSIS — R509 Fever, unspecified: Secondary | ICD-10-CM | POA: Diagnosis not present

## 2018-02-25 DIAGNOSIS — I509 Heart failure, unspecified: Secondary | ICD-10-CM | POA: Diagnosis not present

## 2018-02-25 DIAGNOSIS — G8254 Quadriplegia, C5-C7 incomplete: Secondary | ICD-10-CM | POA: Diagnosis not present

## 2018-02-25 DIAGNOSIS — Z431 Encounter for attention to gastrostomy: Secondary | ICD-10-CM | POA: Diagnosis not present

## 2018-02-25 DIAGNOSIS — I951 Orthostatic hypotension: Secondary | ICD-10-CM | POA: Diagnosis not present

## 2018-02-25 DIAGNOSIS — E039 Hypothyroidism, unspecified: Secondary | ICD-10-CM | POA: Diagnosis not present

## 2018-02-25 DIAGNOSIS — I4821 Permanent atrial fibrillation: Secondary | ICD-10-CM | POA: Diagnosis not present

## 2018-02-25 DIAGNOSIS — K9423 Gastrostomy malfunction: Secondary | ICD-10-CM | POA: Diagnosis not present

## 2018-02-25 DIAGNOSIS — E43 Unspecified severe protein-calorie malnutrition: Secondary | ICD-10-CM | POA: Diagnosis not present

## 2018-02-25 DIAGNOSIS — R0902 Hypoxemia: Secondary | ICD-10-CM | POA: Diagnosis not present

## 2018-02-25 DIAGNOSIS — G92 Toxic encephalopathy: Secondary | ICD-10-CM | POA: Diagnosis not present

## 2018-02-25 DIAGNOSIS — J449 Chronic obstructive pulmonary disease, unspecified: Secondary | ICD-10-CM | POA: Diagnosis not present

## 2018-02-28 DIAGNOSIS — I4891 Unspecified atrial fibrillation: Secondary | ICD-10-CM | POA: Diagnosis not present

## 2018-02-28 DIAGNOSIS — J449 Chronic obstructive pulmonary disease, unspecified: Secondary | ICD-10-CM | POA: Diagnosis not present

## 2018-03-06 DIAGNOSIS — Z743 Need for continuous supervision: Secondary | ICD-10-CM | POA: Diagnosis not present

## 2018-03-06 DIAGNOSIS — K9423 Gastrostomy malfunction: Secondary | ICD-10-CM | POA: Diagnosis not present

## 2018-03-06 DIAGNOSIS — J45909 Unspecified asthma, uncomplicated: Secondary | ICD-10-CM | POA: Diagnosis not present

## 2018-03-06 DIAGNOSIS — I480 Paroxysmal atrial fibrillation: Secondary | ICD-10-CM | POA: Diagnosis not present

## 2018-03-06 DIAGNOSIS — R279 Unspecified lack of coordination: Secondary | ICD-10-CM | POA: Diagnosis not present

## 2018-03-06 DIAGNOSIS — G40909 Epilepsy, unspecified, not intractable, without status epilepticus: Secondary | ICD-10-CM | POA: Diagnosis not present

## 2018-03-06 DIAGNOSIS — T859XXA Unspecified complication of internal prosthetic device, implant and graft, initial encounter: Secondary | ICD-10-CM | POA: Diagnosis not present

## 2018-03-06 DIAGNOSIS — Z87891 Personal history of nicotine dependence: Secondary | ICD-10-CM | POA: Diagnosis not present

## 2018-03-06 DIAGNOSIS — Z4682 Encounter for fitting and adjustment of non-vascular catheter: Secondary | ICD-10-CM | POA: Diagnosis not present

## 2018-03-06 DIAGNOSIS — Z8673 Personal history of transient ischemic attack (TIA), and cerebral infarction without residual deficits: Secondary | ICD-10-CM | POA: Diagnosis not present

## 2018-03-06 DIAGNOSIS — Z9981 Dependence on supplemental oxygen: Secondary | ICD-10-CM | POA: Diagnosis not present

## 2018-03-06 DIAGNOSIS — R5381 Other malaise: Secondary | ICD-10-CM | POA: Diagnosis not present

## 2018-03-06 DIAGNOSIS — J9601 Acute respiratory failure with hypoxia: Secondary | ICD-10-CM | POA: Diagnosis not present

## 2018-03-06 DIAGNOSIS — Z885 Allergy status to narcotic agent status: Secondary | ICD-10-CM | POA: Diagnosis not present

## 2018-03-06 DIAGNOSIS — T8149XA Infection following a procedure, other surgical site, initial encounter: Secondary | ICD-10-CM | POA: Diagnosis not present

## 2018-03-06 DIAGNOSIS — E039 Hypothyroidism, unspecified: Secondary | ICD-10-CM | POA: Diagnosis not present

## 2018-03-10 DIAGNOSIS — Z885 Allergy status to narcotic agent status: Secondary | ICD-10-CM | POA: Diagnosis not present

## 2018-03-10 DIAGNOSIS — J45909 Unspecified asthma, uncomplicated: Secondary | ICD-10-CM | POA: Diagnosis not present

## 2018-03-10 DIAGNOSIS — L02211 Cutaneous abscess of abdominal wall: Secondary | ICD-10-CM | POA: Diagnosis not present

## 2018-03-10 DIAGNOSIS — R279 Unspecified lack of coordination: Secondary | ICD-10-CM | POA: Diagnosis not present

## 2018-03-10 DIAGNOSIS — Z930 Tracheostomy status: Secondary | ICD-10-CM | POA: Diagnosis not present

## 2018-03-10 DIAGNOSIS — G40909 Epilepsy, unspecified, not intractable, without status epilepticus: Secondary | ICD-10-CM | POA: Diagnosis not present

## 2018-03-10 DIAGNOSIS — Z8673 Personal history of transient ischemic attack (TIA), and cerebral infarction without residual deficits: Secondary | ICD-10-CM | POA: Diagnosis not present

## 2018-03-10 DIAGNOSIS — R509 Fever, unspecified: Secondary | ICD-10-CM | POA: Diagnosis not present

## 2018-03-10 DIAGNOSIS — I4891 Unspecified atrial fibrillation: Secondary | ICD-10-CM | POA: Diagnosis not present

## 2018-03-10 DIAGNOSIS — Z743 Need for continuous supervision: Secondary | ICD-10-CM | POA: Diagnosis not present

## 2018-03-10 DIAGNOSIS — Z888 Allergy status to other drugs, medicaments and biological substances status: Secondary | ICD-10-CM | POA: Diagnosis not present

## 2018-03-10 DIAGNOSIS — Z87891 Personal history of nicotine dependence: Secondary | ICD-10-CM | POA: Diagnosis not present

## 2018-03-10 DIAGNOSIS — E039 Hypothyroidism, unspecified: Secondary | ICD-10-CM | POA: Diagnosis not present

## 2018-03-10 DIAGNOSIS — R5381 Other malaise: Secondary | ICD-10-CM | POA: Diagnosis not present

## 2018-03-11 DIAGNOSIS — Z7901 Long term (current) use of anticoagulants: Secondary | ICD-10-CM | POA: Diagnosis not present

## 2018-03-11 DIAGNOSIS — G9340 Encephalopathy, unspecified: Secondary | ICD-10-CM | POA: Diagnosis not present

## 2018-03-11 DIAGNOSIS — J449 Chronic obstructive pulmonary disease, unspecified: Secondary | ICD-10-CM | POA: Diagnosis not present

## 2018-03-11 DIAGNOSIS — G8254 Quadriplegia, C5-C7 incomplete: Secondary | ICD-10-CM | POA: Diagnosis not present

## 2018-03-11 DIAGNOSIS — J9611 Chronic respiratory failure with hypoxia: Secondary | ICD-10-CM | POA: Diagnosis not present

## 2018-03-11 DIAGNOSIS — I4891 Unspecified atrial fibrillation: Secondary | ICD-10-CM | POA: Diagnosis not present

## 2018-03-11 DIAGNOSIS — K9422 Gastrostomy infection: Secondary | ICD-10-CM | POA: Diagnosis not present

## 2018-03-11 DIAGNOSIS — L03311 Cellulitis of abdominal wall: Secondary | ICD-10-CM | POA: Diagnosis not present

## 2018-03-11 DIAGNOSIS — I4892 Unspecified atrial flutter: Secondary | ICD-10-CM | POA: Diagnosis not present

## 2018-03-11 DIAGNOSIS — G40909 Epilepsy, unspecified, not intractable, without status epilepticus: Secondary | ICD-10-CM | POA: Diagnosis not present

## 2018-03-11 DIAGNOSIS — L89154 Pressure ulcer of sacral region, stage 4: Secondary | ICD-10-CM | POA: Diagnosis not present

## 2018-03-11 DIAGNOSIS — S14105S Unspecified injury at C5 level of cervical spinal cord, sequela: Secondary | ICD-10-CM | POA: Diagnosis not present

## 2018-03-14 DIAGNOSIS — S14105S Unspecified injury at C5 level of cervical spinal cord, sequela: Secondary | ICD-10-CM | POA: Diagnosis not present

## 2018-03-14 DIAGNOSIS — S12400A Unspecified displaced fracture of fifth cervical vertebra, initial encounter for closed fracture: Secondary | ICD-10-CM | POA: Diagnosis not present

## 2018-03-14 DIAGNOSIS — S12500D Unspecified displaced fracture of sixth cervical vertebra, subsequent encounter for fracture with routine healing: Secondary | ICD-10-CM | POA: Diagnosis not present

## 2018-03-14 DIAGNOSIS — G8254 Quadriplegia, C5-C7 incomplete: Secondary | ICD-10-CM | POA: Diagnosis not present

## 2018-03-15 DIAGNOSIS — J9611 Chronic respiratory failure with hypoxia: Secondary | ICD-10-CM | POA: Diagnosis not present

## 2018-03-15 DIAGNOSIS — L89154 Pressure ulcer of sacral region, stage 4: Secondary | ICD-10-CM | POA: Diagnosis not present

## 2018-03-15 DIAGNOSIS — K9422 Gastrostomy infection: Secondary | ICD-10-CM | POA: Diagnosis not present

## 2018-03-15 DIAGNOSIS — L03311 Cellulitis of abdominal wall: Secondary | ICD-10-CM | POA: Diagnosis not present

## 2018-03-15 DIAGNOSIS — G40909 Epilepsy, unspecified, not intractable, without status epilepticus: Secondary | ICD-10-CM | POA: Diagnosis not present

## 2018-03-15 DIAGNOSIS — S14105S Unspecified injury at C5 level of cervical spinal cord, sequela: Secondary | ICD-10-CM | POA: Diagnosis not present

## 2018-03-15 DIAGNOSIS — I4891 Unspecified atrial fibrillation: Secondary | ICD-10-CM | POA: Diagnosis not present

## 2018-03-15 DIAGNOSIS — G9340 Encephalopathy, unspecified: Secondary | ICD-10-CM | POA: Diagnosis not present

## 2018-03-15 DIAGNOSIS — G8254 Quadriplegia, C5-C7 incomplete: Secondary | ICD-10-CM | POA: Diagnosis not present

## 2018-03-16 DIAGNOSIS — I951 Orthostatic hypotension: Secondary | ICD-10-CM | POA: Diagnosis not present

## 2018-03-16 DIAGNOSIS — I639 Cerebral infarction, unspecified: Secondary | ICD-10-CM | POA: Diagnosis not present

## 2018-03-16 DIAGNOSIS — Z930 Tracheostomy status: Secondary | ICD-10-CM | POA: Diagnosis not present

## 2018-03-16 DIAGNOSIS — I509 Heart failure, unspecified: Secondary | ICD-10-CM | POA: Diagnosis not present

## 2018-03-16 DIAGNOSIS — S82892A Other fracture of left lower leg, initial encounter for closed fracture: Secondary | ICD-10-CM | POA: Diagnosis not present

## 2018-03-16 DIAGNOSIS — J9621 Acute and chronic respiratory failure with hypoxia: Secondary | ICD-10-CM | POA: Diagnosis not present

## 2018-03-16 DIAGNOSIS — I5023 Acute on chronic systolic (congestive) heart failure: Secondary | ICD-10-CM | POA: Diagnosis not present

## 2018-03-16 DIAGNOSIS — S12100A Unspecified displaced fracture of second cervical vertebra, initial encounter for closed fracture: Secondary | ICD-10-CM | POA: Diagnosis not present

## 2018-03-18 DIAGNOSIS — Z930 Tracheostomy status: Secondary | ICD-10-CM | POA: Diagnosis not present

## 2018-03-18 DIAGNOSIS — I951 Orthostatic hypotension: Secondary | ICD-10-CM | POA: Diagnosis not present

## 2018-03-18 DIAGNOSIS — I5023 Acute on chronic systolic (congestive) heart failure: Secondary | ICD-10-CM | POA: Diagnosis not present

## 2018-03-18 DIAGNOSIS — S82892A Other fracture of left lower leg, initial encounter for closed fracture: Secondary | ICD-10-CM | POA: Diagnosis not present

## 2018-03-18 DIAGNOSIS — I509 Heart failure, unspecified: Secondary | ICD-10-CM | POA: Diagnosis not present

## 2018-03-18 DIAGNOSIS — I639 Cerebral infarction, unspecified: Secondary | ICD-10-CM | POA: Diagnosis not present

## 2018-03-18 DIAGNOSIS — S12100A Unspecified displaced fracture of second cervical vertebra, initial encounter for closed fracture: Secondary | ICD-10-CM | POA: Diagnosis not present

## 2018-03-18 DIAGNOSIS — J9621 Acute and chronic respiratory failure with hypoxia: Secondary | ICD-10-CM | POA: Diagnosis not present

## 2018-03-19 DIAGNOSIS — Z4682 Encounter for fitting and adjustment of non-vascular catheter: Secondary | ICD-10-CM | POA: Diagnosis not present

## 2018-03-19 DIAGNOSIS — T85598A Other mechanical complication of other gastrointestinal prosthetic devices, implants and grafts, initial encounter: Secondary | ICD-10-CM | POA: Diagnosis not present

## 2018-03-19 DIAGNOSIS — K9429 Other complications of gastrostomy: Secondary | ICD-10-CM | POA: Diagnosis not present

## 2018-03-19 DIAGNOSIS — K9423 Gastrostomy malfunction: Secondary | ICD-10-CM | POA: Diagnosis not present

## 2018-03-19 DIAGNOSIS — L03311 Cellulitis of abdominal wall: Secondary | ICD-10-CM | POA: Diagnosis not present

## 2018-03-19 DIAGNOSIS — N858 Other specified noninflammatory disorders of uterus: Secondary | ICD-10-CM | POA: Diagnosis not present

## 2018-03-19 DIAGNOSIS — J984 Other disorders of lung: Secondary | ICD-10-CM | POA: Diagnosis not present

## 2018-03-19 DIAGNOSIS — J9621 Acute and chronic respiratory failure with hypoxia: Secondary | ICD-10-CM | POA: Diagnosis not present

## 2018-03-19 DIAGNOSIS — S12100A Unspecified displaced fracture of second cervical vertebra, initial encounter for closed fracture: Secondary | ICD-10-CM | POA: Diagnosis not present

## 2018-03-19 DIAGNOSIS — G9340 Encephalopathy, unspecified: Secondary | ICD-10-CM | POA: Diagnosis not present

## 2018-03-19 DIAGNOSIS — K9422 Gastrostomy infection: Secondary | ICD-10-CM | POA: Diagnosis not present

## 2018-03-19 DIAGNOSIS — Z87828 Personal history of other (healed) physical injury and trauma: Secondary | ICD-10-CM | POA: Diagnosis not present

## 2018-03-19 DIAGNOSIS — J439 Emphysema, unspecified: Secondary | ICD-10-CM | POA: Diagnosis not present

## 2018-03-19 DIAGNOSIS — G8250 Quadriplegia, unspecified: Secondary | ICD-10-CM | POA: Diagnosis not present

## 2018-03-19 DIAGNOSIS — I471 Supraventricular tachycardia: Secondary | ICD-10-CM | POA: Diagnosis not present

## 2018-03-19 DIAGNOSIS — I10 Essential (primary) hypertension: Secondary | ICD-10-CM | POA: Diagnosis not present

## 2018-03-19 DIAGNOSIS — I639 Cerebral infarction, unspecified: Secondary | ICD-10-CM | POA: Diagnosis not present

## 2018-03-19 DIAGNOSIS — J9622 Acute and chronic respiratory failure with hypercapnia: Secondary | ICD-10-CM | POA: Diagnosis not present

## 2018-03-19 DIAGNOSIS — R918 Other nonspecific abnormal finding of lung field: Secondary | ICD-10-CM | POA: Diagnosis not present

## 2018-03-19 DIAGNOSIS — J45909 Unspecified asthma, uncomplicated: Secondary | ICD-10-CM | POA: Diagnosis not present

## 2018-03-19 DIAGNOSIS — K449 Diaphragmatic hernia without obstruction or gangrene: Secondary | ICD-10-CM | POA: Diagnosis not present

## 2018-03-19 DIAGNOSIS — R404 Transient alteration of awareness: Secondary | ICD-10-CM | POA: Diagnosis not present

## 2018-03-19 DIAGNOSIS — R609 Edema, unspecified: Secondary | ICD-10-CM | POA: Diagnosis not present

## 2018-03-19 DIAGNOSIS — L89159 Pressure ulcer of sacral region, unspecified stage: Secondary | ICD-10-CM | POA: Diagnosis not present

## 2018-03-19 DIAGNOSIS — J90 Pleural effusion, not elsewhere classified: Secondary | ICD-10-CM | POA: Diagnosis not present

## 2018-03-19 DIAGNOSIS — R197 Diarrhea, unspecified: Secondary | ICD-10-CM | POA: Diagnosis not present

## 2018-03-19 DIAGNOSIS — Z981 Arthrodesis status: Secondary | ICD-10-CM | POA: Diagnosis not present

## 2018-03-19 DIAGNOSIS — R9389 Abnormal findings on diagnostic imaging of other specified body structures: Secondary | ICD-10-CM | POA: Diagnosis not present

## 2018-03-19 DIAGNOSIS — E039 Hypothyroidism, unspecified: Secondary | ICD-10-CM | POA: Diagnosis not present

## 2018-03-19 DIAGNOSIS — K316 Fistula of stomach and duodenum: Secondary | ICD-10-CM | POA: Diagnosis not present

## 2018-03-19 DIAGNOSIS — E440 Moderate protein-calorie malnutrition: Secondary | ICD-10-CM | POA: Diagnosis not present

## 2018-03-19 DIAGNOSIS — R531 Weakness: Secondary | ICD-10-CM | POA: Diagnosis not present

## 2018-03-19 DIAGNOSIS — I4819 Other persistent atrial fibrillation: Secondary | ICD-10-CM | POA: Diagnosis not present

## 2018-03-19 DIAGNOSIS — N925 Other specified irregular menstruation: Secondary | ICD-10-CM | POA: Diagnosis not present

## 2018-03-19 DIAGNOSIS — Z9911 Dependence on respirator [ventilator] status: Secondary | ICD-10-CM | POA: Diagnosis not present

## 2018-03-19 DIAGNOSIS — L02211 Cutaneous abscess of abdominal wall: Secondary | ICD-10-CM | POA: Diagnosis not present

## 2018-03-19 DIAGNOSIS — Z930 Tracheostomy status: Secondary | ICD-10-CM | POA: Diagnosis not present

## 2018-03-19 DIAGNOSIS — I509 Heart failure, unspecified: Secondary | ICD-10-CM | POA: Diagnosis not present

## 2018-03-19 DIAGNOSIS — I951 Orthostatic hypotension: Secondary | ICD-10-CM | POA: Diagnosis not present

## 2018-03-19 DIAGNOSIS — J9611 Chronic respiratory failure with hypoxia: Secondary | ICD-10-CM | POA: Diagnosis not present

## 2018-03-19 DIAGNOSIS — R0602 Shortness of breath: Secondary | ICD-10-CM | POA: Diagnosis not present

## 2018-03-19 DIAGNOSIS — G40909 Epilepsy, unspecified, not intractable, without status epilepticus: Secondary | ICD-10-CM | POA: Diagnosis not present

## 2018-03-19 DIAGNOSIS — Z8619 Personal history of other infectious and parasitic diseases: Secondary | ICD-10-CM | POA: Diagnosis not present

## 2018-03-19 DIAGNOSIS — T85598D Other mechanical complication of other gastrointestinal prosthetic devices, implants and grafts, subsequent encounter: Secondary | ICD-10-CM | POA: Diagnosis not present

## 2018-03-19 DIAGNOSIS — R188 Other ascites: Secondary | ICD-10-CM | POA: Diagnosis not present

## 2018-03-19 DIAGNOSIS — R410 Disorientation, unspecified: Secondary | ICD-10-CM | POA: Diagnosis not present

## 2018-03-19 DIAGNOSIS — R0902 Hypoxemia: Secondary | ICD-10-CM | POA: Diagnosis not present

## 2018-03-19 DIAGNOSIS — I4891 Unspecified atrial fibrillation: Secondary | ICD-10-CM | POA: Diagnosis not present

## 2018-03-19 DIAGNOSIS — Z01818 Encounter for other preprocedural examination: Secondary | ICD-10-CM | POA: Diagnosis not present

## 2018-03-19 DIAGNOSIS — J449 Chronic obstructive pulmonary disease, unspecified: Secondary | ICD-10-CM | POA: Diagnosis not present

## 2018-03-19 DIAGNOSIS — I5023 Acute on chronic systolic (congestive) heart failure: Secondary | ICD-10-CM | POA: Diagnosis not present

## 2018-03-19 DIAGNOSIS — I081 Rheumatic disorders of both mitral and tricuspid valves: Secondary | ICD-10-CM | POA: Diagnosis not present

## 2018-03-19 DIAGNOSIS — J949 Pleural condition, unspecified: Secondary | ICD-10-CM | POA: Diagnosis not present

## 2018-03-19 DIAGNOSIS — F329 Major depressive disorder, single episode, unspecified: Secondary | ICD-10-CM | POA: Diagnosis not present

## 2018-03-19 DIAGNOSIS — J9601 Acute respiratory failure with hypoxia: Secondary | ICD-10-CM | POA: Diagnosis not present

## 2018-03-19 DIAGNOSIS — Z452 Encounter for adjustment and management of vascular access device: Secondary | ICD-10-CM | POA: Diagnosis not present

## 2018-03-19 DIAGNOSIS — S82892A Other fracture of left lower leg, initial encounter for closed fracture: Secondary | ICD-10-CM | POA: Diagnosis not present

## 2018-03-19 DIAGNOSIS — R4702 Dysphasia: Secondary | ICD-10-CM | POA: Diagnosis not present

## 2018-03-19 DIAGNOSIS — R569 Unspecified convulsions: Secondary | ICD-10-CM | POA: Diagnosis not present

## 2018-03-19 DIAGNOSIS — G8254 Quadriplegia, C5-C7 incomplete: Secondary | ICD-10-CM | POA: Diagnosis not present

## 2018-03-19 DIAGNOSIS — I4821 Permanent atrial fibrillation: Secondary | ICD-10-CM | POA: Diagnosis not present

## 2018-03-19 DIAGNOSIS — J441 Chronic obstructive pulmonary disease with (acute) exacerbation: Secondary | ICD-10-CM | POA: Diagnosis not present

## 2018-03-19 DIAGNOSIS — Z4789 Encounter for other orthopedic aftercare: Secondary | ICD-10-CM | POA: Diagnosis not present

## 2018-03-20 DIAGNOSIS — K9422 Gastrostomy infection: Secondary | ICD-10-CM | POA: Diagnosis not present

## 2018-03-20 DIAGNOSIS — G8254 Quadriplegia, C5-C7 incomplete: Secondary | ICD-10-CM | POA: Diagnosis not present

## 2018-03-20 DIAGNOSIS — J9611 Chronic respiratory failure with hypoxia: Secondary | ICD-10-CM | POA: Diagnosis not present

## 2018-03-20 DIAGNOSIS — L03311 Cellulitis of abdominal wall: Secondary | ICD-10-CM | POA: Diagnosis not present

## 2018-04-09 ENCOUNTER — Other Ambulatory Visit (HOSPITAL_COMMUNITY): Payer: Self-pay

## 2018-04-09 ENCOUNTER — Inpatient Hospital Stay
Admission: RE | Admit: 2018-04-09 | Discharge: 2018-05-20 | Disposition: A | Discharge status: Skilled Nursing Facility | Payer: Medicare HMO | Source: Other Acute Inpatient Hospital | Attending: Internal Medicine | Admitting: Internal Medicine

## 2018-04-09 DIAGNOSIS — E039 Hypothyroidism, unspecified: Secondary | ICD-10-CM | POA: Diagnosis not present

## 2018-04-09 DIAGNOSIS — J986 Disorders of diaphragm: Secondary | ICD-10-CM | POA: Diagnosis not present

## 2018-04-09 DIAGNOSIS — E440 Moderate protein-calorie malnutrition: Secondary | ICD-10-CM | POA: Diagnosis not present

## 2018-04-09 DIAGNOSIS — Z9911 Dependence on respirator [ventilator] status: Secondary | ICD-10-CM | POA: Diagnosis not present

## 2018-04-09 DIAGNOSIS — F418 Other specified anxiety disorders: Secondary | ICD-10-CM | POA: Diagnosis not present

## 2018-04-09 DIAGNOSIS — J9620 Acute and chronic respiratory failure, unspecified whether with hypoxia or hypercapnia: Secondary | ICD-10-CM | POA: Diagnosis not present

## 2018-04-09 DIAGNOSIS — F329 Major depressive disorder, single episode, unspecified: Secondary | ICD-10-CM | POA: Diagnosis not present

## 2018-04-09 DIAGNOSIS — L0390 Cellulitis, unspecified: Secondary | ICD-10-CM | POA: Diagnosis not present

## 2018-04-09 DIAGNOSIS — I482 Chronic atrial fibrillation, unspecified: Secondary | ICD-10-CM | POA: Diagnosis not present

## 2018-04-09 DIAGNOSIS — B9689 Other specified bacterial agents as the cause of diseases classified elsewhere: Secondary | ICD-10-CM | POA: Diagnosis not present

## 2018-04-09 DIAGNOSIS — J45909 Unspecified asthma, uncomplicated: Secondary | ICD-10-CM | POA: Diagnosis not present

## 2018-04-09 DIAGNOSIS — I361 Nonrheumatic tricuspid (valve) insufficiency: Secondary | ICD-10-CM | POA: Diagnosis not present

## 2018-04-09 DIAGNOSIS — L89154 Pressure ulcer of sacral region, stage 4: Secondary | ICD-10-CM | POA: Diagnosis not present

## 2018-04-09 DIAGNOSIS — K9423 Gastrostomy malfunction: Secondary | ICD-10-CM | POA: Diagnosis not present

## 2018-04-09 DIAGNOSIS — B957 Other staphylococcus as the cause of diseases classified elsewhere: Secondary | ICD-10-CM | POA: Diagnosis not present

## 2018-04-09 DIAGNOSIS — R509 Fever, unspecified: Secondary | ICD-10-CM

## 2018-04-09 DIAGNOSIS — G8912 Acute post-thoracotomy pain: Secondary | ICD-10-CM | POA: Diagnosis not present

## 2018-04-09 DIAGNOSIS — J9690 Respiratory failure, unspecified, unspecified whether with hypoxia or hypercapnia: Secondary | ICD-10-CM

## 2018-04-09 DIAGNOSIS — J4520 Mild intermittent asthma, uncomplicated: Secondary | ICD-10-CM | POA: Diagnosis not present

## 2018-04-09 DIAGNOSIS — G8254 Quadriplegia, C5-C7 incomplete: Secondary | ICD-10-CM | POA: Diagnosis present

## 2018-04-09 DIAGNOSIS — B965 Pseudomonas (aeruginosa) (mallei) (pseudomallei) as the cause of diseases classified elsewhere: Secondary | ICD-10-CM | POA: Diagnosis not present

## 2018-04-09 DIAGNOSIS — J189 Pneumonia, unspecified organism: Secondary | ICD-10-CM

## 2018-04-09 DIAGNOSIS — Z8673 Personal history of transient ischemic attack (TIA), and cerebral infarction without residual deficits: Secondary | ICD-10-CM

## 2018-04-09 DIAGNOSIS — L89159 Pressure ulcer of sacral region, unspecified stage: Secondary | ICD-10-CM | POA: Diagnosis not present

## 2018-04-09 DIAGNOSIS — T85598A Other mechanical complication of other gastrointestinal prosthetic devices, implants and grafts, initial encounter: Secondary | ICD-10-CM | POA: Diagnosis not present

## 2018-04-09 DIAGNOSIS — J151 Pneumonia due to Pseudomonas: Secondary | ICD-10-CM | POA: Diagnosis not present

## 2018-04-09 DIAGNOSIS — J449 Chronic obstructive pulmonary disease, unspecified: Secondary | ICD-10-CM | POA: Diagnosis not present

## 2018-04-09 DIAGNOSIS — Z4659 Encounter for fitting and adjustment of other gastrointestinal appliance and device: Secondary | ICD-10-CM

## 2018-04-09 DIAGNOSIS — N390 Urinary tract infection, site not specified: Secondary | ICD-10-CM | POA: Diagnosis not present

## 2018-04-09 DIAGNOSIS — I10 Essential (primary) hypertension: Secondary | ICD-10-CM | POA: Diagnosis not present

## 2018-04-09 DIAGNOSIS — I4821 Permanent atrial fibrillation: Secondary | ICD-10-CM | POA: Diagnosis not present

## 2018-04-09 DIAGNOSIS — G8250 Quadriplegia, unspecified: Secondary | ICD-10-CM | POA: Diagnosis not present

## 2018-04-09 DIAGNOSIS — Z930 Tracheostomy status: Secondary | ICD-10-CM | POA: Diagnosis not present

## 2018-04-09 DIAGNOSIS — J9621 Acute and chronic respiratory failure with hypoxia: Secondary | ICD-10-CM | POA: Diagnosis not present

## 2018-04-09 DIAGNOSIS — L03311 Cellulitis of abdominal wall: Secondary | ICD-10-CM | POA: Diagnosis not present

## 2018-04-09 DIAGNOSIS — I639 Cerebral infarction, unspecified: Secondary | ICD-10-CM | POA: Diagnosis not present

## 2018-04-09 DIAGNOSIS — E46 Unspecified protein-calorie malnutrition: Secondary | ICD-10-CM | POA: Diagnosis not present

## 2018-04-09 DIAGNOSIS — J9611 Chronic respiratory failure with hypoxia: Secondary | ICD-10-CM | POA: Diagnosis not present

## 2018-04-09 DIAGNOSIS — J40 Bronchitis, not specified as acute or chronic: Secondary | ICD-10-CM | POA: Diagnosis not present

## 2018-04-09 DIAGNOSIS — Z4682 Encounter for fitting and adjustment of non-vascular catheter: Secondary | ICD-10-CM | POA: Diagnosis not present

## 2018-04-09 DIAGNOSIS — G8922 Chronic post-thoracotomy pain: Secondary | ICD-10-CM | POA: Diagnosis not present

## 2018-04-09 DIAGNOSIS — I340 Nonrheumatic mitral (valve) insufficiency: Secondary | ICD-10-CM | POA: Diagnosis not present

## 2018-04-09 DIAGNOSIS — E43 Unspecified severe protein-calorie malnutrition: Secondary | ICD-10-CM | POA: Diagnosis not present

## 2018-04-09 DIAGNOSIS — I5023 Acute on chronic systolic (congestive) heart failure: Secondary | ICD-10-CM | POA: Diagnosis not present

## 2018-04-09 DIAGNOSIS — R7881 Bacteremia: Secondary | ICD-10-CM | POA: Diagnosis not present

## 2018-04-09 DIAGNOSIS — S82892A Other fracture of left lower leg, initial encounter for closed fracture: Secondary | ICD-10-CM | POA: Diagnosis not present

## 2018-04-09 DIAGNOSIS — I509 Heart failure, unspecified: Secondary | ICD-10-CM | POA: Diagnosis not present

## 2018-04-09 DIAGNOSIS — J9622 Acute and chronic respiratory failure with hypercapnia: Secondary | ICD-10-CM | POA: Diagnosis not present

## 2018-04-09 DIAGNOSIS — I480 Paroxysmal atrial fibrillation: Secondary | ICD-10-CM | POA: Diagnosis not present

## 2018-04-09 DIAGNOSIS — Z931 Gastrostomy status: Secondary | ICD-10-CM

## 2018-04-09 DIAGNOSIS — I471 Supraventricular tachycardia: Secondary | ICD-10-CM | POA: Diagnosis not present

## 2018-04-09 DIAGNOSIS — I4891 Unspecified atrial fibrillation: Secondary | ICD-10-CM | POA: Diagnosis not present

## 2018-04-09 LAB — BLOOD GAS, ARTERIAL
ACID-BASE EXCESS: 2.7 mmol/L — AB (ref 0.0–2.0)
Bicarbonate: 27 mmol/L (ref 20.0–28.0)
FIO2: 30
O2 Saturation: 99.1 %
PATIENT TEMPERATURE: 98.6
PEEP: 5 cmH2O
Pressure control: 15 cmH2O
RATE: 18 resp/min
pCO2 arterial: 43.7 mmHg (ref 32.0–48.0)
pH, Arterial: 7.407 (ref 7.350–7.450)
pO2, Arterial: 138 mmHg — ABNORMAL HIGH (ref 83.0–108.0)

## 2018-04-09 LAB — PREPARE RBC (CROSSMATCH)

## 2018-04-09 LAB — ABO/RH: ABO/RH(D): B POS

## 2018-04-09 MED ORDER — TROPICAL LIQUID NUTRITION PO LIQD
5.00 | ORAL | Status: DC
Start: 2018-04-10 — End: 2018-04-09

## 2018-04-09 MED ORDER — GENERIC EXTERNAL MEDICATION
25.00 | Status: DC
Start: ? — End: 2018-04-09

## 2018-04-09 MED ORDER — LORAZEPAM 2 MG/ML IJ SOLN
0.50 | INTRAMUSCULAR | Status: DC
Start: ? — End: 2018-04-09

## 2018-04-09 MED ORDER — LIDOCAINE HCL 2 % IJ SOLN
20.00 | INTRAMUSCULAR | Status: DC
Start: ? — End: 2018-04-09

## 2018-04-09 MED ORDER — DILTIAZEM HCL 30 MG PO TABS
90.00 | ORAL_TABLET | ORAL | Status: DC
Start: 2018-04-09 — End: 2018-04-09

## 2018-04-09 MED ORDER — DEXTROMETHORPHAN-GUAIFENESIN 10-100 MG/5ML PO SYRP
5.00 | ORAL_SOLUTION | ORAL | Status: DC
Start: ? — End: 2018-04-09

## 2018-04-09 MED ORDER — MORPHINE SULFATE (PF) 4 MG/ML IV SOLN
1.00 | INTRAVENOUS | Status: DC
Start: ? — End: 2018-04-09

## 2018-04-09 MED ORDER — INFLUENZA VAC A&B SURF ANT ADJ 0.5 ML IM SUSY
0.50 | PREFILLED_SYRINGE | INTRAMUSCULAR | Status: DC
Start: ? — End: 2018-04-09

## 2018-04-09 MED ORDER — NITROGLYCERIN 0.4 MG SL SUBL
0.40 | SUBLINGUAL_TABLET | SUBLINGUAL | Status: DC
Start: ? — End: 2018-04-09

## 2018-04-09 MED ORDER — OXYCODONE HCL 5 MG/5ML PO SOLN
5.00 | ORAL | Status: DC
Start: 2018-04-09 — End: 2018-04-09

## 2018-04-09 MED ORDER — GENERIC EXTERNAL MEDICATION
Status: DC
Start: 2018-04-09 — End: 2018-04-09

## 2018-04-09 MED ORDER — SERTRALINE HCL 50 MG PO TABS
100.00 | ORAL_TABLET | ORAL | Status: DC
Start: 2018-04-10 — End: 2018-04-09

## 2018-04-09 MED ORDER — BUDESONIDE 0.5 MG/2ML IN SUSP
0.50 | RESPIRATORY_TRACT | Status: DC
Start: 2018-04-09 — End: 2018-04-09

## 2018-04-09 MED ORDER — MELATONIN 1 MG PO TABS
1.00 | ORAL_TABLET | ORAL | Status: DC
Start: 2018-04-09 — End: 2018-04-09

## 2018-04-09 MED ORDER — ACETAMINOPHEN 500 MG PO TABS
500.00 | ORAL_TABLET | ORAL | Status: DC
Start: 2018-04-09 — End: 2018-04-09

## 2018-04-09 MED ORDER — MONTELUKAST SODIUM 10 MG PO TABS
10.00 | ORAL_TABLET | ORAL | Status: DC
Start: 2018-04-09 — End: 2018-04-09

## 2018-04-09 MED ORDER — ACETAMINOPHEN 160 MG/5ML PO SUSP
650.00 | ORAL | Status: DC
Start: ? — End: 2018-04-09

## 2018-04-09 MED ORDER — IPRATROPIUM-ALBUTEROL 0.5-2.5 (3) MG/3ML IN SOLN
3.00 | RESPIRATORY_TRACT | Status: DC
Start: ? — End: 2018-04-09

## 2018-04-09 MED ORDER — ENOXAPARIN SODIUM 30 MG/0.3ML ~~LOC~~ SOLN
30.00 | SUBCUTANEOUS | Status: DC
Start: 2018-04-09 — End: 2018-04-09

## 2018-04-09 MED ORDER — GENERIC EXTERNAL MEDICATION
500.00 | Status: DC
Start: 2018-04-09 — End: 2018-04-09

## 2018-04-09 MED ORDER — METOCLOPRAMIDE HCL 5 MG/5ML PO SOLN
5.00 | ORAL | Status: DC
Start: 2018-04-09 — End: 2018-04-09

## 2018-04-09 MED ORDER — FENTANYL 25 MCG/HR TD PT72
1.00 | MEDICATED_PATCH | TRANSDERMAL | Status: DC
Start: 2018-04-10 — End: 2018-04-09

## 2018-04-09 MED ORDER — SODIUM CHLORIDE 0.9 % IV SOLN
10.00 | INTRAVENOUS | Status: DC
Start: ? — End: 2018-04-09

## 2018-04-09 MED ORDER — GENERIC EXTERNAL MEDICATION
15.00 | Status: DC
Start: ? — End: 2018-04-09

## 2018-04-09 MED ORDER — FAMOTIDINE 20 MG/2ML IV SOLN
20.00 | INTRAVENOUS | Status: DC
Start: 2018-04-10 — End: 2018-04-09

## 2018-04-09 MED ORDER — ARFORMOTEROL TARTRATE 15 MCG/2ML IN NEBU
15.00 | INHALATION_SOLUTION | RESPIRATORY_TRACT | Status: DC
Start: 2018-04-09 — End: 2018-04-09

## 2018-04-09 MED ORDER — METHYLPREDNISOLONE SODIUM SUCC 40 MG IJ SOLR
40.00 | INTRAMUSCULAR | Status: DC
Start: 2018-04-10 — End: 2018-04-09

## 2018-04-09 MED ORDER — PRIMIDONE 250 MG PO TABS
250.00 | ORAL_TABLET | ORAL | Status: DC
Start: 2018-04-09 — End: 2018-04-09

## 2018-04-09 MED ORDER — GENERIC EXTERNAL MEDICATION
112.00 | Status: DC
Start: 2018-04-10 — End: 2018-04-09

## 2018-04-09 MED ORDER — LOPERAMIDE HCL 1 MG/7.5ML PO SUSP
2.00 | ORAL | Status: DC
Start: ? — End: 2018-04-09

## 2018-04-09 MED ORDER — CLOTRIMAZOLE 1 % EX CREA
TOPICAL_CREAM | CUTANEOUS | Status: DC
Start: 2018-04-09 — End: 2018-04-09

## 2018-04-10 LAB — COMPREHENSIVE METABOLIC PANEL
ALT: 52 U/L — ABNORMAL HIGH (ref 0–44)
AST: 36 U/L (ref 15–41)
Albumin: 2.5 g/dL — ABNORMAL LOW (ref 3.5–5.0)
Alkaline Phosphatase: 78 U/L (ref 38–126)
Anion gap: 7 (ref 5–15)
BUN: 18 mg/dL (ref 8–23)
CO2: 30 mmol/L (ref 22–32)
Calcium: 8.8 mg/dL — ABNORMAL LOW (ref 8.9–10.3)
Chloride: 99 mmol/L (ref 98–111)
Creatinine, Ser: <0.3 mg/dL — ABNORMAL LOW (ref 0.44–1.00)
Glucose, Bld: 141 mg/dL — ABNORMAL HIGH (ref 70–99)
Potassium: 3.8 mmol/L (ref 3.5–5.1)
Sodium: 136 mmol/L (ref 135–145)
Total Bilirubin: 0.3 mg/dL (ref 0.3–1.2)
Total Protein: 5.7 g/dL — ABNORMAL LOW (ref 6.5–8.1)

## 2018-04-10 LAB — TSH: TSH: 11.964 u[IU]/mL — ABNORMAL HIGH (ref 0.350–4.500)

## 2018-04-10 LAB — CBC WITH DIFFERENTIAL/PLATELET
Abs Immature Granulocytes: 0.04 10*3/uL (ref 0.00–0.07)
BASOS ABS: 0 10*3/uL (ref 0.0–0.1)
Basophils Relative: 0 %
EOS PCT: 0 %
Eosinophils Absolute: 0 10*3/uL (ref 0.0–0.5)
HCT: 30 % — ABNORMAL LOW (ref 36.0–46.0)
Hemoglobin: 9.5 g/dL — ABNORMAL LOW (ref 12.0–15.0)
Immature Granulocytes: 0 %
Lymphocytes Relative: 1 %
Lymphs Abs: 0.2 10*3/uL — ABNORMAL LOW (ref 0.7–4.0)
MCH: 27.2 pg (ref 26.0–34.0)
MCHC: 31.7 g/dL (ref 30.0–36.0)
MCV: 86 fL (ref 80.0–100.0)
Monocytes Absolute: 0.5 10*3/uL (ref 0.1–1.0)
Monocytes Relative: 4 %
NRBC: 0 % (ref 0.0–0.2)
Neutro Abs: 11.2 10*3/uL — ABNORMAL HIGH (ref 1.7–7.7)
Neutrophils Relative %: 95 %
Platelets: 121 10*3/uL — ABNORMAL LOW (ref 150–400)
RBC: 3.49 MIL/uL — ABNORMAL LOW (ref 3.87–5.11)
RDW: 22.1 % — ABNORMAL HIGH (ref 11.5–15.5)
WBC: 11.8 10*3/uL — ABNORMAL HIGH (ref 4.0–10.5)

## 2018-04-10 LAB — PROTIME-INR
INR: 1.18
Prothrombin Time: 14.9 seconds (ref 11.4–15.2)

## 2018-04-10 LAB — PHOSPHORUS: Phosphorus: 3 mg/dL (ref 2.5–4.6)

## 2018-04-10 LAB — T4, FREE: Free T4: 1.18 ng/dL (ref 0.82–1.77)

## 2018-04-10 LAB — MAGNESIUM: Magnesium: 1.8 mg/dL (ref 1.7–2.4)

## 2018-04-11 DIAGNOSIS — J9621 Acute and chronic respiratory failure with hypoxia: Secondary | ICD-10-CM

## 2018-04-11 DIAGNOSIS — I482 Chronic atrial fibrillation, unspecified: Secondary | ICD-10-CM

## 2018-04-11 DIAGNOSIS — J986 Disorders of diaphragm: Secondary | ICD-10-CM

## 2018-04-11 DIAGNOSIS — G8254 Quadriplegia, C5-C7 incomplete: Secondary | ICD-10-CM

## 2018-04-11 DIAGNOSIS — Z8673 Personal history of transient ischemic attack (TIA), and cerebral infarction without residual deficits: Secondary | ICD-10-CM

## 2018-04-11 LAB — TYPE AND SCREEN
ABO/RH(D): B POS
Antibody Screen: NEGATIVE
UNIT DIVISION: 0

## 2018-04-11 LAB — BPAM RBC
BLOOD PRODUCT EXPIRATION DATE: 202001272359
ISSUE DATE / TIME: 202001010532
Unit Type and Rh: 7300

## 2018-04-11 NOTE — Consult Note (Signed)
Pulmonary Critical Care Medicine [facility] [place]  PULMONARY SERVICE  Date of Service: 04/11/2018  PULMONARY CRITICAL CARE CONSULT   Lindsay Mills  [id number]  DOB: 1945-05-10   DOA: 04/09/2018  Referring Physician: Carron Curie, MD  HPI: Lindsay Mills is a 73 y.o. female seen for follow up of Acute on Chronic Respiratory Failure.  Patient is well-known to [place] from a previous admission.  She has a past medical history significant for C5 quadriplegia due to cervical injury left diaphragmatic paralysis kyphoscoliosis chronic vent dependence on home ventilator.  Patient presented to the hospital due to a gastric tube of problem.  The G-tube was removed and consultation was placed for another PEG placement however patient was found to have erythema and cellulitis around the PEG tube site and she was started on antibiotics.  Interventional radiology was not able to find a suitable window for the G-tube placement general surgery was asked to see the patient and they felt that she was not a candidate for a G-tube placement.  Surgery recommended to continue with nasogastric feeding and back she should be reevaluated in 1 month.  At this time she is comfortable no distress.  Has had clear chest x-rays.  She has had issues with mucous plugging.  The patient tracheostomy was changed with some improvement in her ventilation.  Review of Systems:  ROS performed and is unremarkable other than noted above.  Past Medical History:  Diagnosis Date  . Acute on chronic respiratory failure with hypoxia (HCC) 09/20/2017  . Asthma   . Decreased hearing of right ear    not dx  . Depression   . Headache    otc med prn  . History of stroke 09/20/2017  . Hypothyroidism   . Incomplete quadriplegia at C5-6 level (HCC) 09/20/2017  . Multiple allergies   . Scoliosis   . Seizures (HCC)    well controlled on medication, last seizure at age 39  . Stroke (HCC) 08/2013   mini stroke, no problems  but has some numbness on right check and lower right arm, did not affect her strength   . SVD (spontaneous vaginal delivery)    x 1    Past Surgical History:  Procedure Laterality Date  . BACK SURGERY     x 2, rods in back from shoulder blades to hips  . COLONOSCOPY    . DILATION AND CURETTAGE OF UTERUS    . ganglion cyst removed     right ring finer  . HYSTEROSCOPY W/D&C N/A 10/23/2014   Procedure: DILATATION AND CURETTAGE /HYSTEROSCOPY;  Surgeon: Philip Aspen, DO;  Location: [facility]S;  Service: Gynecology;  Laterality: N/A;  . LAPAROSCOPIC INSERTION GASTROSTOMY TUBE N/A 10/24/2017   Procedure: LAPAROSCOPIC INSERTION GASTROSTOMY TUBE;  Surgeon: Gaynelle Adu, MD;  Location: [facility] OR;  Service: General;  Laterality: N/A;  . left foot surgery  2006  . right foot surgery  2014  . TUBAL LIGATION  1981  . UPPER GI ENDOSCOPY      Social History:    reports that she quit smoking about 42 years ago. Her smoking use included cigarettes. She has a 2.25 pack-year smoking history. She has never used smokeless tobacco. She reports that she does not drink alcohol or use drugs.  Family History: Non-Contributory to the present illness  Allergies  Allergen Reactions  . Codeine Anxiety  . Phenytoin Rash    Medications: Reviewed on Rounds  Physical Exam:  Vitals: Temperature 97.0 pulse 108 respiratory 18 blood  pressure 165/94 saturations 100%  Ventilator Settings mode ventilation pressure assist control FiO2 28% tidal volume 370 PEEP 5  . General: Comfortable at this time . Eyes: Grossly normal lids, irises & conjunctiva . ENT: grossly tongue is normal . Neck: no obvious mass . Cardiovascular: S1-S2 normal no gallop or rub . Respiratory: No rhonchi no rales . Abdomen: Soft nontender . Skin: no rash seen on limited exam . Musculoskeletal: not rigid . Psychiatric:unable to assess . Neurologic: no seizure no involuntary movements         Labs on Admission:  Basic Metabolic  Panel: Recent Labs  Lab 04/10/18 1200  NA 136  K 3.8  CL 99  CO2 30  GLUCOSE 141*  BUN 18  CREATININE <0.30*  CALCIUM 8.8*  MG 1.8  PHOS 3.0    Recent Labs  Lab 04/09/18 1630  PHART 7.407  PCO2ART 43.7  PO2ART 138*  HCO3 27.0  O2SAT 99.1    Liver Function Tests: Recent Labs  Lab 04/10/18 1200  AST 36  ALT 52*  ALKPHOS 78  BILITOT 0.3  PROT 5.7*  ALBUMIN 2.5*   No results for input(s): LIPASE, AMYLASE in the last 168 hours. No results for input(s): AMMONIA in the last 168 hours.  CBC: Recent Labs  Lab 04/10/18 1200  WBC 11.8*  NEUTROABS 11.2*  HGB 9.5*  HCT 30.0*  MCV 86.0  PLT 121*    Cardiac Enzymes: No results for input(s): CKTOTAL, CKMB, CKMBINDEX, TROPONINI in the last 168 hours.  BNP (last 3 results) No results for input(s): BNP in the last 8760 hours.  ProBNP (last 3 results) No results for input(s): PROBNP in the last 8760 hours.   Radiological Exams on Admission: Dg Chest Port 1 View  Result Date: 04/09/2018 CLINICAL DATA:  Respiratory failure. EXAM: PORTABLE CHEST 1 VIEW COMPARISON:  Radiograph of December 29, 2017. FINDINGS: The heart size and mediastinal contours are within normal limits. Tracheostomy tube is in grossly good position. Distal tip of nasogastric tube is seen in proximal stomach. No pneumothorax or pleural effusion is noted. Both lungs are clear. Bilateral Harrington rods are noted in the thoracic and upper lumbar spine. IMPRESSION: Tracheostomy and nasogastric tubes in good position. No acute cardiopulmonary abnormality seen. Electronically Signed   By: Lupita Raider, M.D.   On: 04/09/2018 16:11   Dg Abd Portable 1v  Result Date: 04/09/2018 CLINICAL DATA:  Nasogastric tube position. EXAM: PORTABLE ABDOMEN - 1 VIEW COMPARISON:  Radiograph of November 12, 2017. FINDINGS: The bowel gas pattern is normal. Distal tip of nasogastric tube is seen in expected position of the stomach. Phleboliths are noted in the pelvis.  IMPRESSION: Distal tip of nasogastric tube seen in expected position of the stomach. Electronically Signed   By: Lupita Raider, M.D.   On: 04/09/2018 16:12    Assessment/Plan Active Problems:   Disorders of diaphragm   Atrial fibrillation, chronic   Acute on chronic respiratory failure with hypoxia (HCC)   Incomplete quadriplegia at C5-6 level (HCC)   History of stroke   1. Acute on chronic respiratory failure with hypoxia we will continue with full vent support she is not amenable right now is on 28% FiO2 tidal volume 370 PEEP 5.  She is comfortable on the settings and they will be continued. 2. Chronic atrial fibrillation rate is controlled at this time we will continue to monitor her closely.   3. C5-6 cervical fracture is at baseline we will continue with pain control. 4.  Diaphragmatic paralysis unchanged vent dependent we will continue with present management 5. History of stroke rehab therapy will continue with supportive care 6. Chronic obstructive asthma we will continue with supportive care patient appears to be under good control right now  I have personally seen and evaluated the patient, evaluated laboratory and imaging results, formulated the assessment and plan and placed orders. The Patient requires high complexity decision making for assessment and support.  Case was discussed on Rounds with the Respiratory Therapy Staff Time Spent  Yevonne Pax, MD [facility] Pulmonary Critical Care Medicine Sleep Medicine

## 2018-04-12 NOTE — Progress Notes (Addendum)
Pulmonary Critical Care Medicine [place]   PULMONARY CRITICAL CARE SERVICE  PROGRESS NOTE  Date of Service: 04/12/2018  Lindsay Mills  [id number]  DOB: 1945/11/11   DOA: 04/09/2018  Referring Physician: Merton Border, MD  HPI: Lindsay Mills is a 73 y.o. female seen for follow up of Acute on Chronic Respiratory Failure.  She is at her baseline without distress at this time.  Medications: Reviewed on Rounds  Physical Exam:  Vitals: Temperature 96.4 pulse 95 respiratory 18 blood pressure 175/98 saturations 100%  Ventilator Settings mode ventilation assist control FiO2 28% tidal volume 350 PEEP 5  . General: Comfortable at this time . Eyes: Grossly normal lids, irises & conjunctiva . ENT: grossly tongue is normal . Neck: no obvious mass . Cardiovascular: S1 S2 normal no gallop . Respiratory: No rhonchi no rales are noted at this time . Abdomen: soft . Skin: no rash seen on limited exam . Musculoskeletal: not rigid . Psychiatric:unable to assess . Neurologic: no seizure no involuntary movements         Lab Data:   Basic Metabolic Panel: Recent Labs  Lab 04/10/18 1200  NA 136  K 3.8  CL 99  CO2 30  GLUCOSE 141*  BUN 18  CREATININE <0.30*  CALCIUM 8.8*  MG 1.8  PHOS 3.0    ABG: Recent Labs  Lab 04/09/18 1630  PHART 7.407  PCO2ART 43.7  PO2ART 138*  HCO3 27.0  O2SAT 99.1    Liver Function Tests: Recent Labs  Lab 04/10/18 1200  AST 36  ALT 52*  ALKPHOS 78  BILITOT 0.3  PROT 5.7*  ALBUMIN 2.5*   No results for input(s): LIPASE, AMYLASE in the last 168 hours. No results for input(s): AMMONIA in the last 168 hours.  CBC: Recent Labs  Lab 04/10/18 1200  WBC 11.8*  NEUTROABS 11.2*  HGB 9.5*  HCT 30.0*  MCV 86.0  PLT 121*    Cardiac Enzymes: No results for input(s): CKTOTAL, CKMB, CKMBINDEX, TROPONINI in the last 168 hours.  BNP (last 3 results) No results for input(s): BNP in the last 8760  hours.  ProBNP (last 3 results) No results for input(s): PROBNP in the last 8760 hours.  Radiological Exams: No results found.  Assessment/Plan Active Problems:   Disorders of diaphragm   Atrial fibrillation, chronic   Acute on chronic respiratory failure with hypoxia (HCC)   Incomplete quadriplegia at C5-6 level (HCC)   History of stroke   1. Acute on chronic respiratory failure with hypoxia we will continue with the full vent support at this time.  The patient is not able to wean. 2. Diaphragmatic paralysis as above patient is not weanable 3. Chronic atrial fibrillation rate controlled 4. Incomplete quadriplegia at baseline we will continue with supportive care 5. History of stroke rehab as tolerated   I have personally seen and evaluated the patient, evaluated laboratory and imaging results, formulated the assessment and plan and placed orders. The Patient requires high complexity decision making for assessment and support.  Case was discussed on Rounds with the Respiratory Therapy Staff  Allyne Gee, MD [facility] Pulmonary Critical Care Medicine Sleep Medicine

## 2018-04-13 NOTE — Progress Notes (Signed)
Pulmonary Critical Care Medicine [place]   PULMONARY CRITICAL CARE SERVICE  PROGRESS NOTE  Date of Service: 04/13/2018  DAY GREB  [id number]  DOB: Jan 06, 1946   DOA: 04/09/2018  Referring Physician: Merton Border, MD  HPI: Lindsay Mills is a 73 y.o. female seen for follow up of Acute on Chronic Respiratory Failure.  She is on the ventilator at baseline full support right now on pressure assist control  Medications: Reviewed on Rounds  Physical Exam:  Vitals: Temperature 97.6 pulse 104 respiratory 18 blood pressure 132/71 saturations 100%  Ventilator Settings mode ventilation pressure assist control FiO2 30% tidal volume is 400 inspiratory pressure 15 PEEP 5  . General: Comfortable at this time . Eyes: Grossly normal lids, irises & conjunctiva . ENT: grossly tongue is normal . Neck: no obvious mass . Cardiovascular: S1 S2 normal no gallop . Respiratory: Scattered rhonchi noted bilaterally . Abdomen: soft . Skin: no rash seen on limited exam . Musculoskeletal: not rigid . Psychiatric:unable to assess . Neurologic: no seizure no involuntary movements         Lab Data:   Basic Metabolic Panel: Recent Labs  Lab 04/10/18 1200  NA 136  K 3.8  CL 99  CO2 30  GLUCOSE 141*  BUN 18  CREATININE <0.30*  CALCIUM 8.8*  MG 1.8  PHOS 3.0    ABG: Recent Labs  Lab 04/09/18 1630  PHART 7.407  PCO2ART 43.7  PO2ART 138*  HCO3 27.0  O2SAT 99.1    Liver Function Tests: Recent Labs  Lab 04/10/18 1200  AST 36  ALT 52*  ALKPHOS 78  BILITOT 0.3  PROT 5.7*  ALBUMIN 2.5*   No results for input(s): LIPASE, AMYLASE in the last 168 hours. No results for input(s): AMMONIA in the last 168 hours.  CBC: Recent Labs  Lab 04/10/18 1200  WBC 11.8*  NEUTROABS 11.2*  HGB 9.5*  HCT 30.0*  MCV 86.0  PLT 121*    Cardiac Enzymes: No results for input(s): CKTOTAL, CKMB, CKMBINDEX, TROPONINI in the last 168 hours.  BNP (last 3  results) No results for input(s): BNP in the last 8760 hours.  ProBNP (last 3 results) No results for input(s): PROBNP in the last 8760 hours.  Radiological Exams: No results found.  Assessment/Plan Active Problems:   Disorders of diaphragm   Atrial fibrillation, chronic   Acute on chronic respiratory failure with hypoxia (HCC)   Incomplete quadriplegia at C5-6 level (HCC)   History of stroke   1. Acute on chronic respiratory failure with hypoxia we will continue with full support on the ventilator.  Patient is not weaning well. 2. Diaphragmatic paralysis continue with supportive care 3. Chronic atrial fibrillation rate controlled 4. C5-6 quadriplegia we will continue present therapy 5. History of stroke at baseline   I have personally seen and evaluated the patient, evaluated laboratory and imaging results, formulated the assessment and plan and placed orders. The Patient requires high complexity decision making for assessment and support.  Case was discussed on Rounds with the Respiratory Therapy Staff  Allyne Gee, MD [facility] Pulmonary Critical Care Medicine Sleep Medicine

## 2018-04-14 LAB — BASIC METABOLIC PANEL
ANION GAP: 11 (ref 5–15)
BUN: 19 mg/dL (ref 8–23)
CO2: 36 mmol/L — ABNORMAL HIGH (ref 22–32)
Calcium: 8.9 mg/dL (ref 8.9–10.3)
Chloride: 88 mmol/L — ABNORMAL LOW (ref 98–111)
Creatinine, Ser: <0.3 mg/dL — ABNORMAL LOW (ref 0.44–1.00)
Glucose, Bld: 106 mg/dL — ABNORMAL HIGH (ref 70–99)
POTASSIUM: 4.2 mmol/L (ref 3.5–5.1)
Sodium: 135 mmol/L (ref 135–145)

## 2018-04-14 LAB — CBC
HCT: 33.1 % — ABNORMAL LOW (ref 36.0–46.0)
Hemoglobin: 10.4 g/dL — ABNORMAL LOW (ref 12.0–15.0)
MCH: 27.2 pg (ref 26.0–34.0)
MCHC: 31.4 g/dL (ref 30.0–36.0)
MCV: 86.6 fL (ref 80.0–100.0)
Platelets: 142 10*3/uL — ABNORMAL LOW (ref 150–400)
RBC: 3.82 MIL/uL — AB (ref 3.87–5.11)
RDW: 21 % — ABNORMAL HIGH (ref 11.5–15.5)
WBC: 7.7 10*3/uL (ref 4.0–10.5)
nRBC: 0 % (ref 0.0–0.2)

## 2018-04-14 LAB — MAGNESIUM: Magnesium: 2 mg/dL (ref 1.7–2.4)

## 2018-04-14 NOTE — Progress Notes (Signed)
Pulmonary Critical Care Medicine [place]   PULMONARY CRITICAL CARE SERVICE  PROGRESS NOTE  Date of Service: 04/14/2018  Lindsay Mills  [id number]  DOB: [birth year]   DOA: 04/09/2018  Referring Physician: Merton Border, MD  HPI: Lindsay Mills is a 73 y.o. female seen for follow up of Acute on Chronic Respiratory Failure.  Currently on full support patient is on pressure control mode that is with inspiratory pressure of 15  Medications: Reviewed on Rounds  Physical Exam:  Vitals: Temperature 97.6 pulse 90 respiratory is 18 blood pressure 138/75 saturations 96%  Ventilator Settings mode ventilation pressure assist control FiO2 30% PEEP 5 inspiratory pressure 15  . General: Comfortable at this time . Eyes: Grossly normal lids, irises & conjunctiva . ENT: grossly tongue is normal . Neck: no obvious mass . Cardiovascular: S1 S2 normal no gallop . Respiratory: Scattered rhonchi expansion is equal . Abdomen: soft . Skin: no rash seen on limited exam . Musculoskeletal: not rigid . Psychiatric:unable to assess . Neurologic: no seizure no involuntary movements         Lab Data:   Basic Metabolic Panel: Recent Labs  Lab 04/10/18 1200 04/14/18 0630  NA 136 135  K 3.8 4.2  CL 99 88*  CO2 30 36*  GLUCOSE 141* 106*  BUN 18 19  CREATININE <0.30* <0.30*  CALCIUM 8.8* 8.9  MG 1.8 2.0  PHOS 3.0  --     ABG: Recent Labs  Lab 04/09/18 1630  PHART 7.407  PCO2ART 43.7  PO2ART 138*  HCO3 27.0  O2SAT 99.1    Liver Function Tests: Recent Labs  Lab 04/10/18 1200  AST 36  ALT 52*  ALKPHOS 78  BILITOT 0.3  PROT 5.7*  ALBUMIN 2.5*   No results for input(s): LIPASE, AMYLASE in the last 168 hours. No results for input(s): AMMONIA in the last 168 hours.  CBC: Recent Labs  Lab 04/10/18 1200 04/14/18 0630  WBC 11.8* 7.7  NEUTROABS 11.2*  --   HGB 9.5* 10.4*  HCT 30.0* 33.1*  MCV 86.0 86.6  PLT 121* 142*    Cardiac Enzymes: No  results for input(s): CKTOTAL, CKMB, CKMBINDEX, TROPONINI in the last 168 hours.  BNP (last 3 results) No results for input(s): BNP in the last 8760 hours.  ProBNP (last 3 results) No results for input(s): PROBNP in the last 8760 hours.  Radiological Exams: No results found.  Assessment/Plan Active Problems:   Disorders of diaphragm   Atrial fibrillation, chronic   Acute on chronic respiratory failure with hypoxia (HCC)   Incomplete quadriplegia at C5-6 level (HCC)   History of stroke   1. Acute on chronic respiratory failure hypoxia we will continue with full vent support she is not amenable to wean 2. Differential disorders of diaphragmatic with paralysis continue with supportive care 3. Chronic atrial fibrillation rate controlled 4. C5-6 quadriplegia at baseline 5. History of stroke therapy as tolerated   I have personally seen and evaluated the patient, evaluated laboratory and imaging results, formulated the assessment and plan and placed orders. The Patient requires high complexity decision making for assessment and support.  Case was discussed on Rounds with the Respiratory Therapy Staff  Allyne Gee, MD [facility] Pulmonary Critical Care Medicine Sleep Medicine

## 2018-04-15 NOTE — Progress Notes (Signed)
Pulmonary Critical Care Medicine [place]   PULMONARY CRITICAL CARE SERVICE  PROGRESS NOTE  Date of Service: 04/15/2018  Lindsay Mills  [id number]  DOB: Apr 15, 1945   DOA: 04/09/2018  Referring Physician: Merton Border, MD  HPI: Lindsay Mills is a 73 y.o. female seen for follow up of Acute on Chronic Respiratory Failure.  Patient is comfortable right now without distress.  Remains on the ventilator on full support  Medications: Reviewed on Rounds  Physical Exam:  Vitals: Temperature 97.6 pulse 110 respiratory rate 21 blood pressure 111/72 saturations 100%  Ventilator Settings mode ventilation pressure assist control FiO2 30% tidal volume 319 PEEP 5  . General: Comfortable at this time . Eyes: Grossly normal lids, irises & conjunctiva . ENT: grossly tongue is normal . Neck: no obvious mass . Cardiovascular: S1 S2 normal no gallop . Respiratory: Scattered rhonchi expansion is equal . Abdomen: soft . Skin: no rash seen on limited exam . Musculoskeletal: not rigid . Psychiatric:unable to assess . Neurologic: no seizure no involuntary movements         Lab Data:   Basic Metabolic Panel: Recent Labs  Lab 04/10/18 1200 04/14/18 0630  NA 136 135  K 3.8 4.2  CL 99 88*  CO2 30 36*  GLUCOSE 141* 106*  BUN 18 19  CREATININE <0.30* <0.30*  CALCIUM 8.8* 8.9  MG 1.8 2.0  PHOS 3.0  --     ABG: Recent Labs  Lab 04/09/18 1630  PHART 7.407  PCO2ART 43.7  PO2ART 138*  HCO3 27.0  O2SAT 99.1    Liver Function Tests: Recent Labs  Lab 04/10/18 1200  AST 36  ALT 52*  ALKPHOS 78  BILITOT 0.3  PROT 5.7*  ALBUMIN 2.5*   No results for input(s): LIPASE, AMYLASE in the last 168 hours. No results for input(s): AMMONIA in the last 168 hours.  CBC: Recent Labs  Lab 04/10/18 1200 04/14/18 0630  WBC 11.8* 7.7  NEUTROABS 11.2*  --   HGB 9.5* 10.4*  HCT 30.0* 33.1*  MCV 86.0 86.6  PLT 121* 142*    Cardiac Enzymes: No results for  input(s): CKTOTAL, CKMB, CKMBINDEX, TROPONINI in the last 168 hours.  BNP (last 3 results) No results for input(s): BNP in the last 8760 hours.  ProBNP (last 3 results) No results for input(s): PROBNP in the last 8760 hours.  Radiological Exams: No results found.  Assessment/Plan Active Problems:   Disorders of diaphragm   Atrial fibrillation, chronic   Acute on chronic respiratory failure with hypoxia (HCC)   Incomplete quadriplegia at C5-6 level (HCC)   History of stroke   1. Acute on chronic respiratory failure with hypoxia we will continue with full support on pressure assist control titrate oxygen as tolerated 2. Chronic atrial fibrillation rate controlled 3. Incomplete quadriplegia she is at baseline 4. History of stroke at baseline 5. Diaphragmatic paralysis no changes noted   I have personally seen and evaluated the patient, evaluated laboratory and imaging results, formulated the assessment and plan and placed orders. The Patient requires high complexity decision making for assessment and support.  Case was discussed on Rounds with the Respiratory Therapy Staff  Allyne Gee, MD [facility] Pulmonary Critical Care Medicine Sleep Medicine

## 2018-04-16 NOTE — Progress Notes (Signed)
Pulmonary Critical Care Medicine [place]   PULMONARY CRITICAL CARE SERVICE  PROGRESS NOTE  Date of Service: 04/16/2018  [name]  [id number]  DOB: 12/03/1945   DOA: 04/09/2018  Referring Physician: Merton Border, MD  HPI: Lindsay Mills is a 73 y.o. female seen for follow up of Acute on Chronic Respiratory Failure.  Patient is on full support currently on pressure control mode has been on 30% FiO2 good volumes are noted at this time  Medications: Reviewed on Rounds  Physical Exam:  Vitals: Temperature 97.4 pulse 119 respiratory 16 blood pressure 124/72 saturations 97%  Ventilator Settings mode ventilation pressure control FiO2 30% tidal volume 330 PEEP 5  . General: Comfortable at this time . Eyes: Grossly normal lids, irises & conjunctiva . ENT: grossly tongue is normal . Neck: no obvious mass . Cardiovascular: S1 S2 normal no gallop . Respiratory: No rhonchi or rales are noted at this time . Abdomen: soft . Skin: no rash seen on limited exam . Musculoskeletal: not rigid . Psychiatric:unable to assess . Neurologic: no seizure no involuntary movements         Lab Data:   Basic Metabolic Panel: Recent Labs  Lab 04/10/18 1200 04/14/18 0630  NA 136 135  K 3.8 4.2  CL 99 88*  CO2 30 36*  GLUCOSE 141* 106*  BUN 18 19  CREATININE <0.30* <0.30*  CALCIUM 8.8* 8.9  MG 1.8 2.0  PHOS 3.0  --     ABG: Recent Labs  Lab 04/09/18 1630  PHART 7.407  PCO2ART 43.7  PO2ART 138*  HCO3 27.0  O2SAT 99.1    Liver Function Tests: Recent Labs  Lab 04/10/18 1200  AST 36  ALT 52*  ALKPHOS 78  BILITOT 0.3  PROT 5.7*  ALBUMIN 2.5*   No results for input(s): LIPASE, AMYLASE in the last 168 hours. No results for input(s): AMMONIA in the last 168 hours.  CBC: Recent Labs  Lab 04/10/18 1200 04/14/18 0630  WBC 11.8* 7.7  NEUTROABS 11.2*  --   HGB 9.5* 10.4*  HCT 30.0* 33.1*  MCV 86.0 86.6  PLT 121* 142*    Cardiac  Enzymes: No results for input(s): CKTOTAL, CKMB, CKMBINDEX, TROPONINI in the last 168 hours.  BNP (last 3 results) No results for input(s): BNP in the last 8760 hours.  ProBNP (last 3 results) No results for input(s): PROBNP in the last 8760 hours.  Radiological Exams: No results found.  Assessment/Plan Active Problems:   Disorders of diaphragm   Atrial fibrillation, chronic   Acute on chronic respiratory failure with hypoxia (HCC)   Incomplete quadriplegia at C5-6 level (HCC)   History of stroke   1. Acute on chronic respiratory failure with hypoxia we will continue with pressure control mode continue pulmonary toilet supportive care. 2. Chronic atrial fibrillation rate is controlled 3. Diaphragmatic disorders with paralysis on the ventilator full-time 4. C5-6 quadriplegia we will continue supportive care 5. History of stroke at baseline continue present management   I have personally seen and evaluated the patient, evaluated laboratory and imaging results, formulated the assessment and plan and placed orders. The Patient requires high complexity decision making for assessment and support.  Case was discussed on Rounds with the Respiratory Therapy Staff  Allyne Gee, MD [facility] Pulmonary Critical Care Medicine Sleep Medicine

## 2018-04-17 NOTE — Progress Notes (Signed)
Pulmonary Critical Care Medicine [place]   PULMONARY CRITICAL CARE SERVICE  PROGRESS NOTE  Date of Service: 04/17/2018  KENITA BINES  [id number]  DOB: 12-31-1945   DOA: 04/09/2018  Referring Physician: Merton Border, MD  HPI: Lindsay Mills is a 73 y.o. female seen for follow up of Acute on Chronic Respiratory Failure.  Patient currently is on full support on pressure control mode she is at her baseline right now requiring 30% FiO2  Medications: Reviewed on Rounds  Physical Exam:  Vitals: Temperature 97.2 pulse 102 respiratory 18 blood pressure 162/65 saturations 100%  Ventilator Settings mode ventilation pressure assist control FiO2 30% tidal line 366 PEEP 5  . General: Comfortable at this time . Eyes: Grossly normal lids, irises & conjunctiva . ENT: grossly tongue is normal . Neck: no obvious mass . Cardiovascular: S1 S2 normal no gallop . Respiratory: No rhonchi no rales are noted at this time . Abdomen: soft . Skin: no rash seen on limited exam . Musculoskeletal: not rigid . Psychiatric:unable to assess . Neurologic: no seizure no involuntary movements         Lab Data:   Basic Metabolic Panel: Recent Labs  Lab 04/14/18 0630  NA 135  K 4.2  CL 88*  CO2 36*  GLUCOSE 106*  BUN 19  CREATININE <0.30*  CALCIUM 8.9  MG 2.0    ABG: No results for input(s): PHART, PCO2ART, PO2ART, HCO3, O2SAT in the last 168 hours.  Liver Function Tests: No results for input(s): AST, ALT, ALKPHOS, BILITOT, PROT, ALBUMIN in the last 168 hours. No results for input(s): LIPASE, AMYLASE in the last 168 hours. No results for input(s): AMMONIA in the last 168 hours.  CBC: Recent Labs  Lab 04/14/18 0630  WBC 7.7  HGB 10.4*  HCT 33.1*  MCV 86.6  PLT 142*    Cardiac Enzymes: No results for input(s): CKTOTAL, CKMB, CKMBINDEX, TROPONINI in the last 168 hours.  BNP (last 3 results) No results for input(s): BNP in the last 8760  hours.  ProBNP (last 3 results) No results for input(s): PROBNP in the last 8760 hours.  Radiological Exams: No results found.  Assessment/Plan Active Problems:   Disorders of diaphragm   Atrial fibrillation, chronic   Acute on chronic respiratory failure with hypoxia (HCC)   Incomplete quadriplegia at C5-6 level (HCC)   History of stroke   1. Acute on chronic respiratory failure with hypoxia continue on full vent support.  Patient is not wean able 2. Chronic atrial fibrillation rate controlled 3. Diaphragmatic paralysis continue with supportive care 4. C5-6 quadriplegia at baseline 5. History of stroke continue therapy as tolerated   I have personally seen and evaluated the patient, evaluated laboratory and imaging results, formulated the assessment and plan and placed orders. The Patient requires high complexity decision making for assessment and support.  Case was discussed on Rounds with the Respiratory Therapy Staff  Allyne Gee, MD [facility] Pulmonary Critical Care Medicine Sleep Medicine

## 2018-04-18 LAB — CBC
HEMATOCRIT: 31.8 % — AB (ref 36.0–46.0)
Hemoglobin: 9.8 g/dL — ABNORMAL LOW (ref 12.0–15.0)
MCH: 27.1 pg (ref 26.0–34.0)
MCHC: 30.8 g/dL (ref 30.0–36.0)
MCV: 88.1 fL (ref 80.0–100.0)
Platelets: 167 10*3/uL (ref 150–400)
RBC: 3.61 MIL/uL — ABNORMAL LOW (ref 3.87–5.11)
RDW: 20.5 % — ABNORMAL HIGH (ref 11.5–15.5)
WBC: 6.1 10*3/uL (ref 4.0–10.5)
nRBC: 0 % (ref 0.0–0.2)

## 2018-04-18 LAB — BASIC METABOLIC PANEL
Anion gap: 8 (ref 5–15)
BUN: 19 mg/dL (ref 8–23)
CO2: 34 mmol/L — ABNORMAL HIGH (ref 22–32)
Calcium: 8.7 mg/dL — ABNORMAL LOW (ref 8.9–10.3)
Chloride: 91 mmol/L — ABNORMAL LOW (ref 98–111)
Creatinine, Ser: <0.3 mg/dL — ABNORMAL LOW (ref 0.44–1.00)
Glucose, Bld: 123 mg/dL — ABNORMAL HIGH (ref 70–99)
Potassium: 4 mmol/L (ref 3.5–5.1)
Sodium: 133 mmol/L — ABNORMAL LOW (ref 135–145)

## 2018-04-18 LAB — PHOSPHORUS: Phosphorus: 3.2 mg/dL (ref 2.5–4.6)

## 2018-04-18 LAB — MAGNESIUM: Magnesium: 1.9 mg/dL (ref 1.7–2.4)

## 2018-04-18 NOTE — Progress Notes (Signed)
Pulmonary Critical Care Medicine [place]   PULMONARY CRITICAL CARE SERVICE  PROGRESS NOTE  Date of Service: 04/18/2018  Lindsay Mills  [id number]  DOB: 03-07-1946   DOA: 04/09/2018  Referring Physician: Merton Border, MD  HPI: Lindsay Mills is a 73 y.o. female seen for follow up of Acute on Chronic Respiratory Failure.  At this time patient is on full support on the ventilator.  Comfortable without distress  Medications: Reviewed on Rounds  Physical Exam:  Vitals: Temperature 97.8 pulse 97 respiratory 18 blood pressure 188/93 saturations 98%  Ventilator Settings on the ventilator on full support assist control mode  . General: Comfortable at this time . Eyes: Grossly normal lids, irises & conjunctiva . ENT: grossly tongue is normal . Neck: no obvious mass . Cardiovascular: S1 S2 normal no gallop . Respiratory: Coarse breath sounds few rhonchi . Abdomen: soft . Skin: no rash seen on limited exam . Musculoskeletal: not rigid . Psychiatric:unable to assess . Neurologic: no seizure no involuntary movements         Lab Data:   Basic Metabolic Panel: Recent Labs  Lab 04/14/18 0630 04/18/18 0546  NA 135 133*  K 4.2 4.0  CL 88* 91*  CO2 36* 34*  GLUCOSE 106* 123*  BUN 19 19  CREATININE <0.30* <0.30*  CALCIUM 8.9 8.7*  MG 2.0 1.9  PHOS  --  3.2    ABG: No results for input(s): PHART, PCO2ART, PO2ART, HCO3, O2SAT in the last 168 hours.  Liver Function Tests: No results for input(s): AST, ALT, ALKPHOS, BILITOT, PROT, ALBUMIN in the last 168 hours. No results for input(s): LIPASE, AMYLASE in the last 168 hours. No results for input(s): AMMONIA in the last 168 hours.  CBC: Recent Labs  Lab 04/14/18 0630 04/18/18 0546  WBC 7.7 6.1  HGB 10.4* 9.8*  HCT 33.1* 31.8*  MCV 86.6 88.1  PLT 142* 167    Cardiac Enzymes: No results for input(s): CKTOTAL, CKMB, CKMBINDEX, TROPONINI in the last 168 hours.  BNP (last 3 results) No  results for input(s): BNP in the last 8760 hours.  ProBNP (last 3 results) No results for input(s): PROBNP in the last 8760 hours.  Radiological Exams: No results found.  Assessment/Plan Active Problems:   Disorders of diaphragm   Atrial fibrillation, chronic   Acute on chronic respiratory failure with hypoxia (HCC)   Incomplete quadriplegia at C5-6 level (HCC)   History of stroke   1. Acute on chronic respiratory failure with hypoxia we will continue with full vent support she is not able to be weaned. 2. Chronic atrial fibrillation rate controlled 3. Diaphragmatic paralysis at baseline 4. Incomplete quadriplegia C5-6 continue present management 5. History of stroke at baseline   I have personally seen and evaluated the patient, evaluated laboratory and imaging results, formulated the assessment and plan and placed orders. The Patient requires high complexity decision making for assessment and support.  Case was discussed on Rounds with the Respiratory Therapy Staff  Allyne Gee, MD [facility] Pulmonary Critical Care Medicine Sleep Medicine

## 2018-04-19 ENCOUNTER — Other Ambulatory Visit (HOSPITAL_COMMUNITY): Payer: Self-pay

## 2018-04-19 ENCOUNTER — Encounter (HOSPITAL_COMMUNITY): Payer: Self-pay | Admitting: Interventional Radiology

## 2018-04-19 DIAGNOSIS — K9423 Gastrostomy malfunction: Secondary | ICD-10-CM | POA: Diagnosis not present

## 2018-04-19 HISTORY — PX: IR REPLC GASTRO/COLONIC TUBE PERCUT W/FLUORO: IMG2333

## 2018-04-19 MED ORDER — LIDOCAINE VISCOUS HCL 2 % MT SOLN
OROMUCOSAL | Status: AC
  Administered 2018-04-19: 15:00:00
  Filled 2018-04-19: qty 15

## 2018-04-19 MED ORDER — IOPAMIDOL (ISOVUE-300) INJECTION 61%
INTRAVENOUS | Status: AC
  Administered 2018-04-19: 10 mL
  Filled 2018-04-19: qty 50

## 2018-04-19 NOTE — Progress Notes (Signed)
Attempted to send for pt to come to IR for procedure. Floor RN states that RT is traveling with another pt at this time and it will be this afternoon before they can accommodate coming to IR. Advised that I will call back this afternoon with a time as IR schedule allows.

## 2018-04-19 NOTE — Progress Notes (Signed)
Pulmonary Critical Care Medicine [place]   PULMONARY CRITICAL CARE SERVICE  PROGRESS NOTE  Date of Service: 04/19/2018  Lindsay Mills  [id number]  DOB: 05-Jan-1946   DOA: 04/09/2018  Referring Physician: Merton Border, MD  HPI: Lindsay Mills is a 73 y.o. female seen for follow up of Acute on Chronic Respiratory Failure.  Patient is currently on full support and pressure control she is at her baseline currently on 28% FiO2  Medications: Reviewed on Rounds  Physical Exam:  Vitals: Temperature 97.6 pulse 100 respiratory rate 18 blood pressure 120/78 saturations 99%  Ventilator Settings mode of ventilation pressure assist control FiO2 28% tidal volume 400 PEEP 5 inspiratory pressure 15  . General: Comfortable at this time . Eyes: Grossly normal lids, irises & conjunctiva . ENT: grossly tongue is normal . Neck: no obvious mass . Cardiovascular: S1 S2 normal no gallop . Respiratory: Currently is without rhonchi . Abdomen: soft . Skin: no rash seen on limited exam . Musculoskeletal: not rigid . Psychiatric:unable to assess . Neurologic: no seizure no involuntary movements         Lab Data:   Basic Metabolic Panel: Recent Labs  Lab 04/14/18 0630 04/18/18 0546  NA 135 133*  K 4.2 4.0  CL 88* 91*  CO2 36* 34*  GLUCOSE 106* 123*  BUN 19 19  CREATININE <0.30* <0.30*  CALCIUM 8.9 8.7*  MG 2.0 1.9  PHOS  --  3.2    ABG: No results for input(s): PHART, PCO2ART, PO2ART, HCO3, O2SAT in the last 168 hours.  Liver Function Tests: No results for input(s): AST, ALT, ALKPHOS, BILITOT, PROT, ALBUMIN in the last 168 hours. No results for input(s): LIPASE, AMYLASE in the last 168 hours. No results for input(s): AMMONIA in the last 168 hours.  CBC: Recent Labs  Lab 04/14/18 0630 04/18/18 0546  WBC 7.7 6.1  HGB 10.4* 9.8*  HCT 33.1* 31.8*  MCV 86.6 88.1  PLT 142* 167    Cardiac Enzymes: No results for input(s): CKTOTAL, CKMB,  CKMBINDEX, TROPONINI in the last 168 hours.  BNP (last 3 results) No results for input(s): BNP in the last 8760 hours.  ProBNP (last 3 results) No results for input(s): PROBNP in the last 8760 hours.  Radiological Exams: No results found.  Assessment/Plan Active Problems:   Disorders of diaphragm   Atrial fibrillation, chronic   Acute on chronic respiratory failure with hypoxia (HCC)   Incomplete quadriplegia at C5-6 level (HCC)   History of stroke   1. Acute on chronic respiratory failure with hypoxia we will continue with full support on pressure control titrate oxygen as tolerated continue aggressive pulmonary toilet 2. Chronic atrial fibrillation rate controlled 3. Diaphragmatic paralysis at baseline 4. C5-6 quadriplegia at baseline 5. History of stroke no changes therapy as tolerated   I have personally seen and evaluated the patient, evaluated laboratory and imaging results, formulated the assessment and plan and placed orders. The Patient requires high complexity decision making for assessment and support.  Case was discussed on Rounds with the Respiratory Therapy Staff  Allyne Gee, MD [facility] Pulmonary Critical Care Medicine Sleep Medicine

## 2018-04-20 NOTE — Progress Notes (Signed)
Pulmonary Critical Care Medicine [facility]   PULMONARY CRITICAL CARE SERVICE  PROGRESS NOTE  Date of Service: 04/20/2018  RAKITA JESSER  [id number]  DOB: [birth year]   DOA: 04/09/2018  Referring Physician: Carron Curie, MD  HPI: Lindsay Mills is a 73 y.o. female seen for follow up of Acute on Chronic Respiratory Failure.  At this time patient is on full support she had her PEG tube done yesterday.  She is comfortable without distress  Medications: Reviewed on Rounds  Physical Exam:  Vitals: Temperature 97.4 pulse 98 respiratory rate 18 blood pressure 114/59 saturations 99%  Ventilator Settings mode ventilation pressure assist control FiO2 30% tidal volume 406 PEEP 5  . General: Comfortable at this time . Eyes: Grossly normal lids, irises & conjunctiva . ENT: grossly tongue is normal . Neck: no obvious mass . Cardiovascular: S1 S2 normal no gallop . Respiratory: Coarse breath sounds no rhonchi . Abdomen: soft . Skin: no rash seen on limited exam . Musculoskeletal: not rigid . Psychiatric:unable to assess . Neurologic: no seizure no involuntary movements         Lab Data:   Basic Metabolic Panel: Recent Labs  Lab 04/14/18 0630 04/18/18 0546  NA 135 133*  K 4.2 4.0  CL 88* 91*  CO2 36* 34*  GLUCOSE 106* 123*  BUN 19 19  CREATININE <0.30* <0.30*  CALCIUM 8.9 8.7*  MG 2.0 1.9  PHOS  --  3.2    ABG: No results for input(s): PHART, PCO2ART, PO2ART, HCO3, O2SAT in the last 168 hours.  Liver Function Tests: No results for input(s): AST, ALT, ALKPHOS, BILITOT, PROT, ALBUMIN in the last 168 hours. No results for input(s): LIPASE, AMYLASE in the last 168 hours. No results for input(s): AMMONIA in the last 168 hours.  CBC: Recent Labs  Lab 04/14/18 0630 04/18/18 0546  WBC 7.7 6.1  HGB 10.4* 9.8*  HCT 33.1* 31.8*  MCV 86.6 88.1  PLT 142* 167    Cardiac Enzymes: No results for input(s): CKTOTAL, CKMB, CKMBINDEX, TROPONINI  in the last 168 hours.  BNP (last 3 results) No results for input(s): BNP in the last 8760 hours.  ProBNP (last 3 results) No results for input(s): PROBNP in the last 8760 hours.  Radiological Exams: Ir Replc Gastro/colonic Tube Percut W/fluoro  Result Date: 04/19/2018 INDICATION: Displaced feeding tube EXAM: GASTROSTOMY CATHETER REPLACEMENT MEDICATIONS: None ANESTHESIA/SEDATION: None CONTRAST:  10 mL Isovue 300-administered into the gastric lumen. FLUOROSCOPY TIME:  Fluoroscopy Time: 0 minutes 6 seconds (1 mGy). COMPLICATIONS: None immediate. PROCEDURE: Informed written consent was obtained from the patient after a thorough discussion of the procedural risks, benefits and alternatives. All questions were addressed. A timeout was performed prior to the initiation of the procedure. A new 18 French balloon inflatable percutaneous gastrostomy tube was carefully advanced through the existing stoma and into the stomach. The retention balloon was filled with saline and the external bumper of fixed in place. A gentle hand injection of contrast material confirms the location of the tube in the region of the gastric antrum. IMPRESSION: Successful replacement of an 48 French balloon inflatable percutaneous gastrostomy tube. Electronically Signed   By: Malachy Moan M.D.   On: 04/19/2018 15:26    Assessment/Plan Active Problems:   Disorders of diaphragm   Atrial fibrillation, chronic   Acute on chronic respiratory failure with hypoxia (HCC)   Incomplete quadriplegia at C5-6 level (HCC)   History of stroke   1. Acute on chronic respiratory failure  with hypoxia we will continue with full vent support she is at baseline 2. Chronic atrial fibrillation rate controlled 3. Diaphragmatic paralysis at baseline not able to wean off the ventilator 4. C5-6 quadriplegia at baseline we will continue with supportive care 5. History of stroke therapy as tolerated   I have personally seen and evaluated the  patient, evaluated laboratory and imaging results, formulated the assessment and plan and placed orders. The Patient requires high complexity decision making for assessment and support.  Case was discussed on Rounds with the Respiratory Therapy Staff  Yevonne Pax, MD [facility] Pulmonary Critical Care Medicine Sleep Medicine

## 2018-04-21 NOTE — Progress Notes (Signed)
  Called by nurse due to G-tube leaking.  Patient is s/p placement of a new 18 Fr G-tube on 04/19/2018 due to displacement.  On exam, the tube is in good position and the bumper is tightly secured to the skin.   The skin around the tube is erythematous from inflammation and indentations are present from the bumper.  There was only minimal evidence of leakage on the gauze since yesterday.  The patient is very thin and this is likely why the bumper is causing indentations and inflammation.  The patient reports it is painful.  I placed a split gauze under the bumper to protect the skin and secured the bumper back down. The patient did confirm relief.   I recommended a Wound/Ostomy nurse consult on Monday for recommendations for protection of the surrounding skin.   Ok to use the G-tube for feedings and meds at this time.  The nurse was instructed if there is more evidence of leakage, to stop the tube feedings and the patient may require up-sizing of the Gastrostomy tube.  Deshanta Lady S Latorsha Curling PA-C 04/21/2018 9:54 AM

## 2018-04-21 NOTE — Progress Notes (Signed)
Pulmonary Critical Care Medicine [place]   PULMONARY CRITICAL CARE SERVICE  PROGRESS NOTE  Date of Service: 04/21/2018  Lindsay Mills  [id number]  DOB: 01-13-1946   DOA: 04/09/2018  Referring Physician: Merton Border, MD  HPI: Lindsay Mills is a 73 y.o. female seen for follow up of Acute on Chronic Respiratory Failure.  Patient is doing fine she is on pressure control mode at this time is been on 30% FiO2.  Basically she is at her baseline right now.  The patient had her PEG tube placed there was some leakage which is now improved  Medications: Reviewed on Rounds  Physical Exam:  Vitals: Temperature 96.9 pulse 94 respiratory 18 blood pressure 118/69 saturations 98%  Ventilator Settings mode of ventilation pressure assist control FiO2 30% tidal volume 694 PEEP is 5  . General: Comfortable at this time . Eyes: Grossly normal lids, irises & conjunctiva . ENT: grossly tongue is normal . Neck: no obvious mass . Cardiovascular: S1 S2 normal no gallop . Respiratory: No rhonchi no rales are noted at this time . Abdomen: soft . Skin: no rash seen on limited exam . Musculoskeletal: not rigid . Psychiatric:unable to assess . Neurologic: no seizure no involuntary movements         Lab Data:   Basic Metabolic Panel: Recent Labs  Lab 04/18/18 0546  NA 133*  K 4.0  CL 91*  CO2 34*  GLUCOSE 123*  BUN 19  CREATININE <0.30*  CALCIUM 8.7*  MG 1.9  PHOS 3.2    ABG: No results for input(s): PHART, PCO2ART, PO2ART, HCO3, O2SAT in the last 168 hours.  Liver Function Tests: No results for input(s): AST, ALT, ALKPHOS, BILITOT, PROT, ALBUMIN in the last 168 hours. No results for input(s): LIPASE, AMYLASE in the last 168 hours. No results for input(s): AMMONIA in the last 168 hours.  CBC: Recent Labs  Lab 04/18/18 0546  WBC 6.1  HGB 9.8*  HCT 31.8*  MCV 88.1  PLT 167    Cardiac Enzymes: No results for input(s): CKTOTAL, CKMB,  CKMBINDEX, TROPONINI in the last 168 hours.  BNP (last 3 results) No results for input(s): BNP in the last 8760 hours.  ProBNP (last 3 results) No results for input(s): PROBNP in the last 8760 hours.  Radiological Exams: No results found.  Assessment/Plan Active Problems:   Disorders of diaphragm   Atrial fibrillation, chronic   Acute on chronic respiratory failure with hypoxia (HCC)   Incomplete quadriplegia at C5-6 level (HCC)   History of stroke   1. Acute on chronic respiratory failure with hypoxia we will continue with full vent support on pressure assist control titrate oxygen as tolerated. 2. Incomplete quadriplegia C5-6 continue with supportive care 3. History of stroke at baseline continue to monitor 4. Chronic atrial fibrillation rate control 5. Diaphragm paralysis at baseline   I have personally seen and evaluated the patient, evaluated laboratory and imaging results, formulated the assessment and plan and placed orders. The Patient requires high complexity decision making for assessment and support.  Case was discussed on Rounds with the Respiratory Therapy Staff  Allyne Gee, MD [facility] Pulmonary Critical Care Medicine Sleep Medicine

## 2018-04-22 NOTE — Progress Notes (Signed)
Pulmonary Critical Care Medicine [place]   PULMONARY CRITICAL CARE SERVICE  PROGRESS NOTE  Date of Service: 04/22/2018  Lindsay Mills  [id number]  DOB: May 04, 1945   DOA: 04/09/2018  Referring Physician: Merton Border, MD  HPI: Lindsay Mills is a 73 y.o. female seen for follow up of Acute on Chronic Respiratory Failure.  Patient is at baseline she is on pressure control mode has been on 30% FiO2  Medications: Reviewed on Rounds  Physical Exam:  Vitals: Temperature 98.2 pulse 100 respiratory rate 30 blood pressure 99/56 saturations 99%  Ventilator Settings mode ventilation pressure assist control FiO2 30% inspiratory pressure 15 PEEP 5  . General: Comfortable at this time . Eyes: Grossly normal lids, irises & conjunctiva . ENT: grossly tongue is normal . Neck: no obvious mass . Cardiovascular: S1 S2 normal no gallop . Respiratory: No rhonchi or rales are noted at this time . Abdomen: soft . Skin: no rash seen on limited exam . Musculoskeletal: not rigid . Psychiatric:unable to assess . Neurologic: no seizure no involuntary movements         Lab Data:   Basic Metabolic Panel: Recent Labs  Lab 04/18/18 0546  NA 133*  K 4.0  CL 91*  CO2 34*  GLUCOSE 123*  BUN 19  CREATININE <0.30*  CALCIUM 8.7*  MG 1.9  PHOS 3.2    ABG: No results for input(s): PHART, PCO2ART, PO2ART, HCO3, O2SAT in the last 168 hours.  Liver Function Tests: No results for input(s): AST, ALT, ALKPHOS, BILITOT, PROT, ALBUMIN in the last 168 hours. No results for input(s): LIPASE, AMYLASE in the last 168 hours. No results for input(s): AMMONIA in the last 168 hours.  CBC: Recent Labs  Lab 04/18/18 0546  WBC 6.1  HGB 9.8*  HCT 31.8*  MCV 88.1  PLT 167    Cardiac Enzymes: No results for input(s): CKTOTAL, CKMB, CKMBINDEX, TROPONINI in the last 168 hours.  BNP (last 3 results) No results for input(s): BNP in the last 8760 hours.  ProBNP (last 3  results) No results for input(s): PROBNP in the last 8760 hours.  Radiological Exams: No results found.  Assessment/Plan Active Problems:   Disorders of diaphragm   Atrial fibrillation, chronic   Acute on chronic respiratory failure with hypoxia (HCC)   Incomplete quadriplegia at C5-6 level (HCC)   History of stroke   1. Acute on chronic respiratory failure with hypoxia we will continue with pressure control mode titrate oxygen as tolerated continue pulmonary toilet. 2. Chronic atrial fibrillation rate is controlled we will continue present management 3. Diaphragm paralysis at baseline we will continue supportive care 4. C5-6 quadriplegia at baseline 5. History of stroke at baseline continue present management   I have personally seen and evaluated the patient, evaluated laboratory and imaging results, formulated the assessment and plan and placed orders. The Patient requires high complexity decision making for assessment and support.  Case was discussed on Rounds with the Respiratory Therapy Staff  Allyne Gee, MD [facility] Pulmonary Critical Care Medicine Sleep Medicine

## 2018-04-23 LAB — CBC
HCT: 32.3 % — ABNORMAL LOW (ref 36.0–46.0)
Hemoglobin: 10.2 g/dL — ABNORMAL LOW (ref 12.0–15.0)
MCH: 28 pg (ref 26.0–34.0)
MCHC: 31.6 g/dL (ref 30.0–36.0)
MCV: 88.7 fL (ref 80.0–100.0)
NRBC: 0 % (ref 0.0–0.2)
Platelets: 184 10*3/uL (ref 150–400)
RBC: 3.64 MIL/uL — ABNORMAL LOW (ref 3.87–5.11)
RDW: 19.5 % — ABNORMAL HIGH (ref 11.5–15.5)
WBC: 8.3 10*3/uL (ref 4.0–10.5)

## 2018-04-23 LAB — BASIC METABOLIC PANEL
ANION GAP: 11 (ref 5–15)
BUN: 15 mg/dL (ref 8–23)
CHLORIDE: 93 mmol/L — AB (ref 98–111)
CO2: 32 mmol/L (ref 22–32)
Calcium: 9.2 mg/dL (ref 8.9–10.3)
Creatinine, Ser: <0.3 mg/dL — ABNORMAL LOW (ref 0.44–1.00)
Glucose, Bld: 149 mg/dL — ABNORMAL HIGH (ref 70–99)
Potassium: 3.8 mmol/L (ref 3.5–5.1)
SODIUM: 136 mmol/L (ref 135–145)

## 2018-04-23 LAB — MAGNESIUM: Magnesium: 1.7 mg/dL (ref 1.7–2.4)

## 2018-04-23 NOTE — Progress Notes (Signed)
Pulmonary Critical Care Medicine [place]   PULMONARY CRITICAL CARE SERVICE  PROGRESS NOTE  Date of Service: 04/23/2018  Lindsay Mills  [id number]  DOB: Oct 09, 1945   DOA: 04/09/2018  Referring Physician: Merton Border, MD  HPI: Lindsay Mills is a 73 y.o. female seen for follow up of Acute on Chronic Respiratory Failure.  Patient is comfortable right now on the ventilator full support she is not doing any weaning remains on pressure control mode  Medications: Reviewed on Rounds  Physical Exam:  Vitals: Temperature is 98 pulse 90 respiratory rate is 29 blood pressure 95/57 saturations 98%  Ventilator Settings mode ventilation pressure assist control FiO2 30% tidal volume 367 PEEP 5  . General: Comfortable at this time . Eyes: Grossly normal lids, irises & conjunctiva . ENT: grossly tongue is normal . Neck: no obvious mass . Cardiovascular: S1 S2 normal no gallop . Respiratory: No rhonchi or rales are noted at this time . Abdomen: soft . Skin: no rash seen on limited exam . Musculoskeletal: not rigid . Psychiatric:unable to assess . Neurologic: no seizure no involuntary movements         Lab Data:   Basic Metabolic Panel: Recent Labs  Lab 04/18/18 0546  NA 133*  K 4.0  CL 91*  CO2 34*  GLUCOSE 123*  BUN 19  CREATININE <0.30*  CALCIUM 8.7*  MG 1.9  PHOS 3.2    ABG: No results for input(s): PHART, PCO2ART, PO2ART, HCO3, O2SAT in the last 168 hours.  Liver Function Tests: No results for input(s): AST, ALT, ALKPHOS, BILITOT, PROT, ALBUMIN in the last 168 hours. No results for input(s): LIPASE, AMYLASE in the last 168 hours. No results for input(s): AMMONIA in the last 168 hours.  CBC: Recent Labs  Lab 04/18/18 0546  WBC 6.1  HGB 9.8*  HCT 31.8*  MCV 88.1  PLT 167    Cardiac Enzymes: No results for input(s): CKTOTAL, CKMB, CKMBINDEX, TROPONINI in the last 168 hours.  BNP (last 3 results) No results for input(s):  BNP in the last 8760 hours.  ProBNP (last 3 results) No results for input(s): PROBNP in the last 8760 hours.  Radiological Exams: No results found.  Assessment/Plan Active Problems:   Disorders of diaphragm   Atrial fibrillation, chronic   Acute on chronic respiratory failure with hypoxia (HCC)   Incomplete quadriplegia at C5-6 level (HCC)   History of stroke   1. Acute on chronic respiratory failure with hypoxia patient will continue with full vent support she is not wean able 2. Chronic atrial fibrillation rate controlled 3. Diaphragmatic paralysis at baseline 4. C5-6 quad continue with therapy as tolerated 5. History of stroke unchanged   I have personally seen and evaluated the patient, evaluated laboratory and imaging results, formulated the assessment and plan and placed orders. The Patient requires high complexity decision making for assessment and support.  Case was discussed on Rounds with the Respiratory Therapy Staff  Allyne Gee, MD [facility] Pulmonary Critical Care Medicine Sleep Medicine

## 2018-04-24 NOTE — Progress Notes (Signed)
Pulmonary Critical Care Medicine [place]   PULMONARY CRITICAL CARE SERVICE  PROGRESS NOTE  Date of Service: 04/24/2018  Lindsay Mills  EZM:[id number]  DOB: April 30, 1945   DOA: 04/09/2018  Referring Physician: Merton Border, MD  HPI: Lindsay Mills is a 73 y.o. female seen for follow up of Acute on Chronic Respiratory Failure.  She remains on the ventilator and full support pressure control mode awaiting discharge  Medications: Reviewed on Rounds  Physical Exam:  Vitals: Temperature 97.2 pulse 96 respiratory 18 blood pressure 104/57 saturations 97%  Ventilator Settings mode ventilation pressure assist control FiO2 30% tidal volume 330 PEEP 5  . General: Comfortable at this time . Eyes: Grossly normal lids, irises & conjunctiva . ENT: grossly tongue is normal . Neck: no obvious mass . Cardiovascular: S1 S2 normal no gallop . Respiratory: No rhonchi or rales are noted . Abdomen: soft . Skin: no rash seen on limited exam . Musculoskeletal: not rigid . Psychiatric:unable to assess . Neurologic: no seizure no involuntary movements         Lab Data:   Basic Metabolic Panel: Recent Labs  Lab 04/18/18 0546 04/23/18 1058  NA 133* 136  K 4.0 3.8  CL 91* 93*  CO2 34* 32  GLUCOSE 123* 149*  BUN 19 15  CREATININE <0.30* <0.30*  CALCIUM 8.7* 9.2  MG 1.9 1.7  PHOS 3.2  --     ABG: No results for input(s): PHART, PCO2ART, PO2ART, HCO3, O2SAT in the last 168 hours.  Liver Function Tests: No results for input(s): AST, ALT, ALKPHOS, BILITOT, PROT, ALBUMIN in the last 168 hours. No results for input(s): LIPASE, AMYLASE in the last 168 hours. No results for input(s): AMMONIA in the last 168 hours.  CBC: Recent Labs  Lab 04/18/18 0546 04/23/18 1058  WBC 6.1 8.3  HGB 9.8* 10.2*  HCT 31.8* 32.3*  MCV 88.1 88.7  PLT 167 184    Cardiac Enzymes: No results for input(s): CKTOTAL, CKMB, CKMBINDEX, TROPONINI in the last 168 hours.  BNP (last  3 results) No results for input(s): BNP in the last 8760 hours.  ProBNP (last 3 results) No results for input(s): PROBNP in the last 8760 hours.  Radiological Exams: No results found.  Assessment/Plan Active Problems:   Disorders of diaphragm   Atrial fibrillation, chronic   Acute on chronic respiratory failure with hypoxia (HCC)   Incomplete quadriplegia at C5-6 level (HCC)   History of stroke   1. Acute on chronic respiratory failure with hypoxia we will continue with pressure assist control mode continue pulmonary toilet supportive care. 2. Chronic atrial fibrillation rate is controlled at this time we will continue present management 3. Incomplete quadriplegia at C5-6 continue with supportive care 4. History of stroke at baseline we will continue present management   I have personally seen and evaluated the patient, evaluated laboratory and imaging results, formulated the assessment and plan and placed orders. The Patient requires high complexity decision making for assessment and support.  Case was discussed on Rounds with the Respiratory Therapy Staff  Allyne Gee, MD [facility] Pulmonary Critical Care Medicine Sleep Medicine

## 2018-04-25 NOTE — Progress Notes (Signed)
Pulmonary Critical Care Medicine [place]   PULMONARY CRITICAL CARE SERVICE  PROGRESS NOTE  Date of Service: 04/25/2018  Lindsay Mills  [id number]:[id number]  DOB: [birth year]   DOA: 04/09/2018  Referring Physician: Merton Border, MD  HPI: Lindsay Mills is a 73 y.o. female seen for follow up of Acute on Chronic Respiratory Failure.  Patient is at baseline on the ventilator currently on full support on pressure assist control mode  Medications: Reviewed on Rounds  Physical Exam:  Vitals: Temperature 97.4 pulse 99 respiratory 19 blood pressure 133/64 saturations 96%  Ventilator Settings mode ventilation pressure assist control FiO2 30% tidal volume 629 PEEP 5  . General: Comfortable at this time . Eyes: Grossly normal lids, irises & conjunctiva . ENT: grossly tongue is normal . Neck: no obvious mass . Cardiovascular: S1 S2 normal no gallop . Respiratory: No rhonchi or rales are noted at this time . Abdomen: soft . Skin: no rash seen on limited exam . Musculoskeletal: not rigid . Psychiatric:unable to assess . Neurologic: no seizure no involuntary movements         Lab Data:   Basic Metabolic Panel: Recent Labs  Lab 04/23/18 1058  NA 136  K 3.8  CL 93*  CO2 32  GLUCOSE 149*  BUN 15  CREATININE <0.30*  CALCIUM 9.2  MG 1.7    ABG: No results for input(s): PHART, PCO2ART, PO2ART, HCO3, O2SAT in the last 168 hours.  Liver Function Tests: No results for input(s): AST, ALT, ALKPHOS, BILITOT, PROT, ALBUMIN in the last 168 hours. No results for input(s): LIPASE, AMYLASE in the last 168 hours. No results for input(s): AMMONIA in the last 168 hours.  CBC: Recent Labs  Lab 04/23/18 1058  WBC 8.3  HGB 10.2*  HCT 32.3*  MCV 88.7  PLT 184    Cardiac Enzymes: No results for input(s): CKTOTAL, CKMB, CKMBINDEX, TROPONINI in the last 168 hours.  BNP (last 3 results) No results for input(s): BNP in the last 8760 hours.  ProBNP (last 3  results) No results for input(s): PROBNP in the last 8760 hours.  Radiological Exams: No results found.  Assessment/Plan Active Problems:   Disorders of diaphragm   Atrial fibrillation, chronic   Acute on chronic respiratory failure with hypoxia (HCC)   Incomplete quadriplegia at C5-6 level (HCC)   History of stroke   1. Acute on chronic respiratory failure with hypoxia we will continue with full support on pressure assist control.  Awaiting discharge to home 2. Chronic atrial fibrillation rate is controlled 3. Diaphragmatic paralysis at baseline continue with supportive care 4. Incomplete stroke at C5-6 quadriplegia we will continue with supportive care 5. History of stroke at baseline we will continue present management   I have personally seen and evaluated the patient, evaluated laboratory and imaging results, formulated the assessment and plan and placed orders. The Patient requires high complexity decision making for assessment and support.  Case was discussed on Rounds with the Respiratory Therapy Staff  Allyne Gee, MD [facility] Pulmonary Critical Care Medicine Sleep Medicine

## 2018-04-26 NOTE — Progress Notes (Signed)
Pulmonary Critical Care Medicine [place]   PULMONARY CRITICAL CARE SERVICE  PROGRESS NOTE  Date of Service: 04/26/2018  MABRY SANTARELLI  CNO:[id number]  DOB: 1945/04/22   DOA: 04/09/2018  Referring Physician: Merton Border, MD  HPI: Lindsay Mills is a 73 y.o. female seen for follow up of Acute on Chronic Respiratory Failure.  At this time patient is doing fine she is on assist control mode she is on 30% FiO2 so that her baseline  Medications: Reviewed on Rounds  Physical Exam:  Vitals: Temperature 96.8 pulse 104 respiratory 25 blood pressure 134/74 saturations 100%  Ventilator Settings mode ventilation assist control FiO2 30% tidal line 327 PEEP 5  . General: Comfortable at this time . Eyes: Grossly normal lids, irises & conjunctiva . ENT: grossly tongue is normal . Neck: no obvious mass . Cardiovascular: S1 S2 normal no gallop . Respiratory: No rhonchi or rales are noted at this time . Abdomen: soft . Skin: no rash seen on limited exam . Musculoskeletal: not rigid . Psychiatric:unable to assess . Neurologic: no seizure no involuntary movements         Lab Data:   Basic Metabolic Panel: Recent Labs  Lab 04/23/18 1058  NA 136  K 3.8  CL 93*  CO2 32  GLUCOSE 149*  BUN 15  CREATININE <0.30*  CALCIUM 9.2  MG 1.7    ABG: No results for input(s): PHART, PCO2ART, PO2ART, HCO3, O2SAT in the last 168 hours.  Liver Function Tests: No results for input(s): AST, ALT, ALKPHOS, BILITOT, PROT, ALBUMIN in the last 168 hours. No results for input(s): LIPASE, AMYLASE in the last 168 hours. No results for input(s): AMMONIA in the last 168 hours.  CBC: Recent Labs  Lab 04/23/18 1058  WBC 8.3  HGB 10.2*  HCT 32.3*  MCV 88.7  PLT 184    Cardiac Enzymes: No results for input(s): CKTOTAL, CKMB, CKMBINDEX, TROPONINI in the last 168 hours.  BNP (last 3 results) No results for input(s): BNP in the last 8760 hours.  ProBNP (last 3  results) No results for input(s): PROBNP in the last 8760 hours.  Radiological Exams: No results found.  Assessment/Plan Active Problems:   Disorders of diaphragm   Atrial fibrillation, chronic   Acute on chronic respiratory failure with hypoxia (HCC)   Incomplete quadriplegia at C5-6 level (HCC)   History of stroke   1. Acute on chronic respiratory failure with hypoxia we will continue with full support on the ventilator patient is comfortable on the current settings 2. Diaphragmatic paralysis continue with full vent support 3. Chronic atrial fibrillation rate controlled 4. Incomplete quadriplegia C5-6 at baseline 5. History of stroke therapy as tolerated   I have personally seen and evaluated the patient, evaluated laboratory and imaging results, formulated the assessment and plan and placed orders. The Patient requires high complexity decision making for assessment and support.  Case was discussed on Rounds with the Respiratory Therapy Staff  Allyne Gee, MD [facility] Pulmonary Critical Care Medicine Sleep Medicine

## 2018-04-27 NOTE — Progress Notes (Signed)
Pulmonary Critical Care Medicine [place]   PULMONARY CRITICAL CARE SERVICE  PROGRESS NOTE  Date of Service: 04/27/2018  Lindsay Mills  [id number]  DOB: [birth year]   DOA: 04/09/2018  Referring Physician: Merton Border, MD  HPI: Lindsay Mills is a 73 y.o. female seen for follow up of Acute on Chronic Respiratory Failure.  Patient is comfortable right now without distress on pressure control patient is on 30% FiO2 she is at her baseline  Medications: Reviewed on Rounds  Physical Exam:  Vitals: Temperature 97.4 pulse 87 respiratory 24 blood pressure 134/74 saturation 97%  Ventilator Settings mode ventilation pressure assist control FiO2 30% tidal volume 400 PEEP 5  . General: Comfortable at this time . Eyes: Grossly normal lids, irises & conjunctiva . ENT: grossly tongue is normal . Neck: no obvious mass . Cardiovascular: S1 S2 normal no gallop . Respiratory: No rhonchi or rales are noted at this time . Abdomen: soft . Skin: no rash seen on limited exam . Musculoskeletal: not rigid . Psychiatric:unable to assess . Neurologic: no seizure no involuntary movements         Lab Data:   Basic Metabolic Panel: Recent Labs  Lab 04/23/18 1058  NA 136  K 3.8  CL 93*  CO2 32  GLUCOSE 149*  BUN 15  CREATININE <0.30*  CALCIUM 9.2  MG 1.7    ABG: No results for input(s): PHART, PCO2ART, PO2ART, HCO3, O2SAT in the last 168 hours.  Liver Function Tests: No results for input(s): AST, ALT, ALKPHOS, BILITOT, PROT, ALBUMIN in the last 168 hours. No results for input(s): LIPASE, AMYLASE in the last 168 hours. No results for input(s): AMMONIA in the last 168 hours.  CBC: Recent Labs  Lab 04/23/18 1058  WBC 8.3  HGB 10.2*  HCT 32.3*  MCV 88.7  PLT 184    Cardiac Enzymes: No results for input(s): CKTOTAL, CKMB, CKMBINDEX, TROPONINI in the last 168 hours.  BNP (last 3 results) No results for input(s): BNP in the last 8760  hours.  ProBNP (last 3 results) No results for input(s): PROBNP in the last 8760 hours.  Radiological Exams: No results found.  Assessment/Plan Active Problems:   Disorders of diaphragm   Atrial fibrillation, chronic   Acute on chronic respiratory failure with hypoxia (HCC)   Incomplete quadriplegia at C5-6 level (HCC)   History of stroke   1. Acute on chronic respiratory failure hypoxia we will continue with supportive care at baseline on the vent 2. Diaphragmatic paralysis no changes 3. Chronic atrial fibrillation rate is well controlled 4. C5-6 "continue supportive care 5. History of stroke therapy as tolerated   I have personally seen and evaluated the patient, evaluated laboratory and imaging results, formulated the assessment and plan and placed orders. The Patient requires high complexity decision making for assessment and support.  Case was discussed on Rounds with the Respiratory Therapy Staff  Allyne Gee, MD [facility] Pulmonary Critical Care Medicine Sleep Medicine

## 2018-04-28 ENCOUNTER — Other Ambulatory Visit (HOSPITAL_COMMUNITY): Payer: Self-pay

## 2018-04-28 DIAGNOSIS — R509 Fever, unspecified: Secondary | ICD-10-CM | POA: Diagnosis not present

## 2018-04-28 LAB — URINALYSIS, ROUTINE W REFLEX MICROSCOPIC
BILIRUBIN URINE: NEGATIVE
Glucose, UA: NEGATIVE mg/dL
Ketones, ur: NEGATIVE mg/dL
Nitrite: POSITIVE — AB
Protein, ur: NEGATIVE mg/dL
Specific Gravity, Urine: 1.019 (ref 1.005–1.030)
WBC, UA: >50 WBC/hpf — ABNORMAL HIGH (ref 0–5)
pH: 8 (ref 5.0–8.0)

## 2018-04-28 LAB — BASIC METABOLIC PANEL
Anion gap: 10 (ref 5–15)
BUN: 22 mg/dL (ref 8–23)
CO2: 31 mmol/L (ref 22–32)
Calcium: 8.7 mg/dL — ABNORMAL LOW (ref 8.9–10.3)
Chloride: 94 mmol/L — ABNORMAL LOW (ref 98–111)
Creatinine, Ser: <0.3 mg/dL — ABNORMAL LOW (ref 0.44–1.00)
Glucose, Bld: 135 mg/dL — ABNORMAL HIGH (ref 70–99)
Potassium: 4.5 mmol/L (ref 3.5–5.1)
Sodium: 135 mmol/L (ref 135–145)

## 2018-04-28 LAB — CBC
HCT: 29.7 % — ABNORMAL LOW (ref 36.0–46.0)
Hemoglobin: 9.4 g/dL — ABNORMAL LOW (ref 12.0–15.0)
MCH: 28.1 pg (ref 26.0–34.0)
MCHC: 31.6 g/dL (ref 30.0–36.0)
MCV: 88.9 fL (ref 80.0–100.0)
Platelets: 192 10*3/uL (ref 150–400)
RBC: 3.34 MIL/uL — AB (ref 3.87–5.11)
RDW: 19.2 % — ABNORMAL HIGH (ref 11.5–15.5)
WBC: 16.4 10*3/uL — ABNORMAL HIGH (ref 4.0–10.5)
nRBC: 0 % (ref 0.0–0.2)

## 2018-04-28 LAB — MAGNESIUM: Magnesium: 1.6 mg/dL — ABNORMAL LOW (ref 1.7–2.4)

## 2018-04-28 NOTE — Progress Notes (Signed)
Pulmonary Critical Care Medicine [facility] [place]   PULMONARY CRITICAL CARE SERVICE  PROGRESS NOTE  Date of Service: 04/28/2018  Lindsay Mills  [id number]  DOB: 19-Jun-1945   DOA: 04/09/2018  Referring Physician: Carron Curie, MD  HPI: Lindsay Mills is a 73 y.o. female seen for follow up of Acute on Chronic Respiratory Failure.  Patient is on full support right now she did have a fever of 101.3 now down to 97.8 patient has been cultured  Medications: Reviewed on Rounds  Physical Exam:  Vitals: Temperature 101.3 pulse 120 respiratory 24 blood pressure 113/48 saturations 96%  Ventilator Settings mode ventilation pressure assist control FiO2 28% tidal volume 600 PEEP 8  . General: Comfortable at this time . Eyes: Grossly normal lids, irises & conjunctiva . ENT: grossly tongue is normal . Neck: no obvious mass . Cardiovascular: S1 S2 normal no gallop . Respiratory: No rhonchi or rales are noted . Abdomen: soft . Skin: no rash seen on limited exam . Musculoskeletal: not rigid . Psychiatric:unable to assess . Neurologic: no seizure no involuntary movements         Lab Data:   Basic Metabolic Panel: Recent Labs  Lab 04/23/18 1058 04/28/18 0602  NA 136 135  K 3.8 4.5  CL 93* 94*  CO2 32 31  GLUCOSE 149* 135*  BUN 15 22  CREATININE <0.30* <0.30*  CALCIUM 9.2 8.7*  MG 1.7 1.6*    ABG: No results for input(s): PHART, PCO2ART, PO2ART, HCO3, O2SAT in the last 168 hours.  Liver Function Tests: No results for input(s): AST, ALT, ALKPHOS, BILITOT, PROT, ALBUMIN in the last 168 hours. No results for input(s): LIPASE, AMYLASE in the last 168 hours. No results for input(s): AMMONIA in the last 168 hours.  CBC: Recent Labs  Lab 04/23/18 1058 04/28/18 0602  WBC 8.3 16.4*  HGB 10.2* 9.4*  HCT 32.3* 29.7*  MCV 88.7 88.9  PLT 184 192    Cardiac Enzymes: No results for input(s): CKTOTAL, CKMB, CKMBINDEX, TROPONINI in the last 168  hours.  BNP (last 3 results) No results for input(s): BNP in the last 8760 hours.  ProBNP (last 3 results) No results for input(s): PROBNP in the last 8760 hours.  Radiological Exams: Dg Chest Port 1 View  Result Date: 04/28/2018 CLINICAL DATA:  Fever.  History of stroke and scoliosis. EXAM: PORTABLE CHEST 1 VIEW COMPARISON:  April 09, 2017 FINDINGS: Scoliotic changes in the spine. Elevated left hemidiaphragm, unchanged. No pulmonary nodules, masses, or focal infiltrates. Tracheostomy tube remains in place. No pneumothorax. IMPRESSION: No cause for fever identified. Electronically Signed   By: Gerome Sam III M.D   On: 04/28/2018 12:36    Assessment/Plan Active Problems:   Disorders of diaphragm   Atrial fibrillation, chronic   Acute on chronic respiratory failure with hypoxia (HCC)   Incomplete quadriplegia at C5-6 level (HCC)   History of stroke   1. Acute on chronic respiratory failure with hypoxia continue with full support on pressure assist control right now patient is obviously not weaning.  We will continue with pulmonary toilet supportive care. 2. Chronic atrial fibrillation rate is controlled at this time 3. Fevers will be worked up continue with supportive care 4. C5-6 quadriplegia continue present therapy 5. History of stroke at baseline continue present management   I have personally seen and evaluated the patient, evaluated laboratory and imaging results, formulated the assessment and plan and placed orders. The Patient requires high complexity decision making for assessment  and support.  Case was discussed on Rounds with the Respiratory Therapy Staff  Yevonne Pax, MD [facility] Pulmonary Critical Care Medicine Sleep Medicine

## 2018-04-29 LAB — BLOOD CULTURE ID PANEL (REFLEXED)
Acinetobacter baumannii: NOT DETECTED
Candida albicans: NOT DETECTED
Candida glabrata: NOT DETECTED
Candida krusei: NOT DETECTED
Candida parapsilosis: NOT DETECTED
Candida tropicalis: NOT DETECTED
Enterobacter cloacae complex: NOT DETECTED
Enterobacteriaceae species: NOT DETECTED
Enterococcus species: NOT DETECTED
Escherichia coli: NOT DETECTED
Haemophilus influenzae: NOT DETECTED
Klebsiella oxytoca: NOT DETECTED
Klebsiella pneumoniae: NOT DETECTED
Listeria monocytogenes: NOT DETECTED
Methicillin resistance: DETECTED — AB
Neisseria meningitidis: NOT DETECTED
PSEUDOMONAS AERUGINOSA: NOT DETECTED
Proteus species: NOT DETECTED
STREPTOCOCCUS PNEUMONIAE: NOT DETECTED
Serratia marcescens: NOT DETECTED
Staphylococcus aureus (BCID): NOT DETECTED
Staphylococcus species: DETECTED — AB
Streptococcus agalactiae: NOT DETECTED
Streptococcus pyogenes: NOT DETECTED
Streptococcus species: NOT DETECTED

## 2018-04-29 LAB — MAGNESIUM: Magnesium: 2.1 mg/dL (ref 1.7–2.4)

## 2018-04-29 NOTE — Progress Notes (Signed)
Pulmonary Critical Care Medicine [facility] [place]   PULMONARY CRITICAL CARE SERVICE  PROGRESS NOTE  Date of Service: 04/29/2018  Lindsay Mills  [id number]:[id number]  DOB: [birth year]   DOA: 04/09/2018  Referring Physician: Carron Curie, MD  HPI: Lindsay Mills is a 73 y.o. female seen for follow up of Acute on Chronic Respiratory Failure.  Patient is on full support on the ventilator.  She is afebrile overnight  Medications: Reviewed on Rounds  Physical Exam:  Vitals: Temperature 97.0 pulse 114 respiratory 18 blood pressure 123/66 saturations 100%  Ventilator Settings mode ventilation pressure assist control FiO2 30% tidal volume 364 PEEP 5  . General: Comfortable at this time . Eyes: Grossly normal lids, irises & conjunctiva . ENT: grossly tongue is normal . Neck: no obvious mass . Cardiovascular: S1 S2 normal no gallop . Respiratory: No rhonchi or rales are noted . Abdomen: soft . Skin: no rash seen on limited exam . Musculoskeletal: not rigid . Psychiatric:unable to assess . Neurologic: no seizure no involuntary movements         Lab Data:   Basic Metabolic Panel: Recent Labs  Lab 04/23/18 1058 04/28/18 0602 04/29/18 0632  NA 136 135  --   K 3.8 4.5  --   CL 93* 94*  --   CO2 32 31  --   GLUCOSE 149* 135*  --   BUN 15 22  --   CREATININE <0.30* <0.30*  --   CALCIUM 9.2 8.7*  --   MG 1.7 1.6* 2.1    ABG: No results for input(s): PHART, PCO2ART, PO2ART, HCO3, O2SAT in the last 168 hours.  Liver Function Tests: No results for input(s): AST, ALT, ALKPHOS, BILITOT, PROT, ALBUMIN in the last 168 hours. No results for input(s): LIPASE, AMYLASE in the last 168 hours. No results for input(s): AMMONIA in the last 168 hours.  CBC: Recent Labs  Lab 04/23/18 1058 04/28/18 0602  WBC 8.3 16.4*  HGB 10.2* 9.4*  HCT 32.3* 29.7*  MCV 88.7 88.9  PLT 184 192    Cardiac Enzymes: No results for input(s): CKTOTAL, CKMB, CKMBINDEX, TROPONINI in  the last 168 hours.  BNP (last 3 results) No results for input(s): BNP in the last 8760 hours.  ProBNP (last 3 results) No results for input(s): PROBNP in the last 8760 hours.  Radiological Exams: Dg Chest Port 1 View  Result Date: 04/28/2018 CLINICAL DATA:  Fever.  History of stroke and scoliosis. EXAM: PORTABLE CHEST 1 VIEW COMPARISON:  April 09, 2017 FINDINGS: Scoliotic changes in the spine. Elevated left hemidiaphragm, unchanged. No pulmonary nodules, masses, or focal infiltrates. Tracheostomy tube remains in place. No pneumothorax. IMPRESSION: No cause for fever identified. Electronically Signed   By: Gerome Sam III M.D   On: 04/28/2018 12:36    Assessment/Plan Active Problems:   Disorders of diaphragm   Atrial fibrillation, chronic   Acute on chronic respiratory failure with hypoxia (HCC)   Incomplete quadriplegia at C5-6 level (HCC)   History of stroke   1. Acute on chronic respiratory failure with hypoxia we will continue with full support on pressure control titrate oxygen as tolerated continue secretion management pulmonary toilet 2. Chronic atrial fibrillation rate controlled 3. C5-6 quadriplegia at baseline we will continue to monitor 4. History of stroke continue therapy as tolerated 5. Diaphragmatic paralysis at baseline   I have personally seen and evaluated the patient, evaluated laboratory and imaging results, formulated the assessment and plan and placed orders. The Patient requires  high complexity decision making for assessment and support.  Case was discussed on Rounds with the Respiratory Therapy Staff  Yevonne Pax, MD [facility] Pulmonary Critical Care Medicine Sleep Medicine

## 2018-04-30 ENCOUNTER — Other Ambulatory Visit (HOSPITAL_COMMUNITY): Payer: Self-pay

## 2018-04-30 DIAGNOSIS — J9620 Acute and chronic respiratory failure, unspecified whether with hypoxia or hypercapnia: Secondary | ICD-10-CM | POA: Diagnosis not present

## 2018-04-30 LAB — BASIC METABOLIC PANEL
Anion gap: 6 (ref 5–15)
BUN: 24 mg/dL — ABNORMAL HIGH (ref 8–23)
CALCIUM: 8.7 mg/dL — AB (ref 8.9–10.3)
CO2: 33 mmol/L — ABNORMAL HIGH (ref 22–32)
Chloride: 96 mmol/L — ABNORMAL LOW (ref 98–111)
Creatinine, Ser: <0.3 mg/dL — ABNORMAL LOW (ref 0.44–1.00)
Glucose, Bld: 130 mg/dL — ABNORMAL HIGH (ref 70–99)
Potassium: 4.3 mmol/L (ref 3.5–5.1)
Sodium: 135 mmol/L (ref 135–145)

## 2018-04-30 LAB — CBC
HCT: 26.8 % — ABNORMAL LOW (ref 36.0–46.0)
Hemoglobin: 8.1 g/dL — ABNORMAL LOW (ref 12.0–15.0)
MCH: 26.9 pg (ref 26.0–34.0)
MCHC: 30.2 g/dL (ref 30.0–36.0)
MCV: 89 fL (ref 80.0–100.0)
Platelets: 156 10*3/uL (ref 150–400)
RBC: 3.01 MIL/uL — ABNORMAL LOW (ref 3.87–5.11)
RDW: 19.4 % — AB (ref 11.5–15.5)
WBC: 8.1 10*3/uL (ref 4.0–10.5)
nRBC: 0 % (ref 0.0–0.2)

## 2018-04-30 LAB — PHOSPHORUS: PHOSPHORUS: 2.4 mg/dL — AB (ref 2.5–4.6)

## 2018-04-30 LAB — MAGNESIUM: Magnesium: 1.9 mg/dL (ref 1.7–2.4)

## 2018-04-30 NOTE — Progress Notes (Signed)
Pulmonary Critical Care Medicine [facility] [place]   PULMONARY CRITICAL CARE SERVICE  PROGRESS NOTE  Date of Service: 04/30/2018  Lindsay Mills  VOZ:[id number]  DOB: May 29, 1945   DOA: 04/09/2018  Referring Physician: Carron Curie, MD  HPI: Lindsay Mills is a 73 y.o. female seen for follow up of Acute on Chronic Respiratory Failure.  Patient currently is on full vent support remains on pressure control at this time.  Requiring 30% FiO2  Medications: Reviewed on Rounds  Physical Exam:  Vitals: Temperature 98.4 pulse 97 respiratory 18 blood pressure 173/86 saturations 93%  Ventilator Settings currently is on pressure assist control FiO2 30% tidal volume 311 PEEP 5 inspiratory pressure 15  . General: Comfortable at this time . Eyes: Grossly normal lids, irises & conjunctiva . ENT: grossly tongue is normal . Neck: no obvious mass . Cardiovascular: S1 S2 normal no gallop . Respiratory: No rhonchi or rales are noted at this time . Abdomen: soft . Skin: no rash seen on limited exam . Musculoskeletal: not rigid . Psychiatric:unable to assess . Neurologic: no seizure no involuntary movements         Lab Data:   Basic Metabolic Panel: Recent Labs  Lab 04/28/18 0602 04/29/18 0632 04/30/18 0600  NA 135  --  135  K 4.5  --  4.3  CL 94*  --  96*  CO2 31  --  33*  GLUCOSE 135*  --  130*  BUN 22  --  24*  CREATININE <0.30*  --  <0.30*  CALCIUM 8.7*  --  8.7*  MG 1.6* 2.1 1.9  PHOS  --   --  2.4*    ABG: No results for input(s): PHART, PCO2ART, PO2ART, HCO3, O2SAT in the last 168 hours.  Liver Function Tests: No results for input(s): AST, ALT, ALKPHOS, BILITOT, PROT, ALBUMIN in the last 168 hours. No results for input(s): LIPASE, AMYLASE in the last 168 hours. No results for input(s): AMMONIA in the last 168 hours.  CBC: Recent Labs  Lab 04/28/18 0602 04/30/18 0600  WBC 16.4* 8.1  HGB 9.4* 8.1*  HCT 29.7* 26.8*  MCV 88.9 89.0  PLT 192 156     Cardiac Enzymes: No results for input(s): CKTOTAL, CKMB, CKMBINDEX, TROPONINI in the last 168 hours.  BNP (last 3 results) No results for input(s): BNP in the last 8760 hours.  ProBNP (last 3 results) No results for input(s): PROBNP in the last 8760 hours.  Radiological Exams: Dg Chest Port 1 View  Result Date: 04/30/2018 CLINICAL DATA:  73 year old female with acute on chronic respiratory failure. Intubated. C5-C6 quadriplegia. EXAM: PORTABLE CHEST 1 VIEW COMPARISON:  04/28/2018 and earlier. FINDINGS: Portable AP semi upright view at 0630 hours. Endotracheal tube tip is in good position, just below the clavicles. Extensive thoracolumbar posterior spinal fusion hardware again noted. Stable left chest cardiac event recorder. Stable ventilation since yesterday. Large lung volumes. No pneumothorax or pulmonary edema. Confluent retrocardiac opacity which appears chronic and related to elevated hemidiaphragm or diaphragmatic hernia containing some bowel (CT Abdomen and Pelvis 11/10/2017). No other confluent pulmonary opacity. No definite pleural effusion. Nonobstructed visible bowel gas pattern. IMPRESSION: 1. Stable ET tube in good position. 2. No acute cardiopulmonary abnormality. Chronic large lung volumes with retrocardiac elevated hemidiaphragm or bowel containing hernia. Electronically Signed   By: Odessa Fleming M.D.   On: 04/30/2018 07:30    Assessment/Plan Active Problems:   Disorders of diaphragm   Atrial fibrillation, chronic   Acute on chronic  respiratory failure with hypoxia (HCC)   Incomplete quadriplegia at C5-6 level (HCC)   History of stroke   1. Acute on chronic respiratory failure with hypoxia we will continue with full support pressure assist control mode.  Patient is currently requiring 30% FiO2 at baseline on pressure control 2. Chronic atrial fibrillation rate controlled 3. Diaphragmatic paralysis at baseline 4. Incomplete quadriplegia C5-6 continue with present  therapy 5. History of stroke unchanged we will continue supportive care   I have personally seen and evaluated the patient, evaluated laboratory and imaging results, formulated the assessment and plan and placed orders. The Patient requires high complexity decision making for assessment and support.  Case was discussed on Rounds with the Respiratory Therapy Staff  Yevonne Pax, MD [facility] Pulmonary Critical Care Medicine Sleep Medicine

## 2018-05-01 LAB — CULTURE, RESPIRATORY W GRAM STAIN

## 2018-05-01 LAB — MAGNESIUM: Magnesium: 1.9 mg/dL (ref 1.7–2.4)

## 2018-05-01 LAB — BASIC METABOLIC PANEL
Anion gap: 9 (ref 5–15)
BUN: 24 mg/dL — AB (ref 8–23)
CO2: 31 mmol/L (ref 22–32)
Calcium: 8.7 mg/dL — ABNORMAL LOW (ref 8.9–10.3)
Chloride: 100 mmol/L (ref 98–111)
Creatinine, Ser: <0.3 mg/dL — ABNORMAL LOW (ref 0.44–1.00)
Glucose, Bld: 119 mg/dL — ABNORMAL HIGH (ref 70–99)
Potassium: 4.2 mmol/L (ref 3.5–5.1)
Sodium: 140 mmol/L (ref 135–145)

## 2018-05-01 LAB — PHOSPHORUS: Phosphorus: 3.1 mg/dL (ref 2.5–4.6)

## 2018-05-01 LAB — URINE CULTURE: Culture: >=100000 — AB

## 2018-05-01 NOTE — Progress Notes (Signed)
Pulmonary Critical Care Medicine [facility] [place]   PULMONARY CRITICAL CARE SERVICE  PROGRESS NOTE  Date of Service: 05/01/2018  Lindsay Mills  [id number]:[id number]  DOB: [birth year]   DOA: 04/09/2018  Referring Physician: Carron Curie, MD  HPI: ELFIE DOELLING is a 73 y.o. female seen for follow up of Acute on Chronic Respiratory Failure.  She is doing fine at baseline awaiting discharge probably next week  Medications: Reviewed on Rounds  Physical Exam:  Vitals: Temperature 97.2 pulse 118 respiratory 22 blood pressure 127/71 saturations 98%  Ventilator Settings mode ventilation pressure assist control FiO2 30% tidal volume 330 PEEP 5 inspiratory pressure 15  . General: Comfortable at this time . Eyes: Grossly normal lids, irises & conjunctiva . ENT: grossly tongue is normal . Neck: no obvious mass . Cardiovascular: S1 S2 normal no gallop . Respiratory: No rhonchi or rales are noted at this time . Abdomen: soft . Skin: no rash seen on limited exam . Musculoskeletal: not rigid . Psychiatric:unable to assess . Neurologic: no seizure no involuntary movements         Lab Data:   Basic Metabolic Panel: Recent Labs  Lab 04/28/18 0602 04/29/18 0632 04/30/18 0600 05/01/18 1128  NA 135  --  135 140  K 4.5  --  4.3 4.2  CL 94*  --  96* 100  CO2 31  --  33* 31  GLUCOSE 135*  --  130* 119*  BUN 22  --  24* 24*  CREATININE <0.30*  --  <0.30* <0.30*  CALCIUM 8.7*  --  8.7* 8.7*  MG 1.6* 2.1 1.9 1.9  PHOS  --   --  2.4* 3.1    ABG: No results for input(s): PHART, PCO2ART, PO2ART, HCO3, O2SAT in the last 168 hours.  Liver Function Tests: No results for input(s): AST, ALT, ALKPHOS, BILITOT, PROT, ALBUMIN in the last 168 hours. No results for input(s): LIPASE, AMYLASE in the last 168 hours. No results for input(s): AMMONIA in the last 168 hours.  CBC: Recent Labs  Lab 04/28/18 0602 04/30/18 0600  WBC 16.4* 8.1  HGB 9.4* 8.1*  HCT 29.7* 26.8*  MCV  88.9 89.0  PLT 192 156    Cardiac Enzymes: No results for input(s): CKTOTAL, CKMB, CKMBINDEX, TROPONINI in the last 168 hours.  BNP (last 3 results) No results for input(s): BNP in the last 8760 hours.  ProBNP (last 3 results) No results for input(s): PROBNP in the last 8760 hours.  Radiological Exams: Dg Chest Port 1 View  Result Date: 04/30/2018 CLINICAL DATA:  73 year old female with acute on chronic respiratory failure. Intubated. C5-C6 quadriplegia. EXAM: PORTABLE CHEST 1 VIEW COMPARISON:  04/28/2018 and earlier. FINDINGS: Portable AP semi upright view at 0630 hours. Endotracheal tube tip is in good position, just below the clavicles. Extensive thoracolumbar posterior spinal fusion hardware again noted. Stable left chest cardiac event recorder. Stable ventilation since yesterday. Large lung volumes. No pneumothorax or pulmonary edema. Confluent retrocardiac opacity which appears chronic and related to elevated hemidiaphragm or diaphragmatic hernia containing some bowel (CT Abdomen and Pelvis 11/10/2017). No other confluent pulmonary opacity. No definite pleural effusion. Nonobstructed visible bowel gas pattern. IMPRESSION: 1. Stable ET tube in good position. 2. No acute cardiopulmonary abnormality. Chronic large lung volumes with retrocardiac elevated hemidiaphragm or bowel containing hernia. Electronically Signed   By: Odessa Fleming M.D.   On: 04/30/2018 07:30    Assessment/Plan Active Problems:   Disorders of diaphragm   Atrial fibrillation, chronic  Acute on chronic respiratory failure with hypoxia (HCC)   Incomplete quadriplegia at C5-6 level (HCC)   History of stroke   1. Acute on chronic respiratory failure with hypoxia she is at baseline not able to wean we will continue with a full support on pressure assist control 2. Chronic atrial fibrillation rate controlled 3. C5-6 quadriplegia at baseline continue present management 4. History of stroke unchanged 5. Diaphragmatic  paralysis at baseline   I have personally seen and evaluated the patient, evaluated laboratory and imaging results, formulated the assessment and plan and placed orders. The Patient requires high complexity decision making for assessment and support.  Case was discussed on Rounds with the Respiratory Therapy Staff  Yevonne Pax, MD [facility] Pulmonary Critical Care Medicine Sleep Medicine

## 2018-05-02 LAB — CULTURE, BLOOD (ROUTINE X 2): SPECIAL REQUESTS: ADEQUATE

## 2018-05-02 LAB — VANCOMYCIN, TROUGH: Vancomycin Tr: 13 ug/mL — ABNORMAL LOW (ref 15–20)

## 2018-05-02 NOTE — Progress Notes (Addendum)
Pulmonary Critical Care Medicine [place]   PULMONARY CRITICAL CARE SERVICE  PROGRESS NOTE  Date of Service: 05/02/2018  Lindsay Mills  [id number]  DOB: 03/07/1946   DOA: 04/09/2018  Referring Physician: Merton Border, MD  HPI: Lindsay Mills is a 73 y.o. female seen for follow up of Acute on Chronic Respiratory Failure.  Patient continues to do well at her baseline.  Awaiting discharge.  No distress noted.  Medications: Reviewed on Rounds  Physical Exam:  Vitals: Pulse 97 respiratory 18 blood pressure 126/65 O2 sat 98% temp 97.2  Ventilator Settings ventilator mode pressure assist control FiO2 30% tidal volume 330 PEEP of 5 inspiratory pressure 15  . General: Comfortable at this time . Eyes: Grossly normal lids, irises & conjunctiva . ENT: grossly tongue is normal . Neck: no obvious mass . Cardiovascular: S1 S2 normal no gallop . Respiratory: No rhonchi rales noted . Abdomen: soft . Skin: no rash seen on limited exam . Musculoskeletal: not rigid . Psychiatric:unable to assess . Neurologic: no seizure no involuntary movements         Lab Data:   Basic Metabolic Panel: Recent Labs  Lab 04/28/18 0602 04/29/18 0632 04/30/18 0600 05/01/18 1128  NA 135  --  135 140  K 4.5  --  4.3 4.2  CL 94*  --  96* 100  CO2 31  --  33* 31  GLUCOSE 135*  --  130* 119*  BUN 22  --  24* 24*  CREATININE <0.30*  --  <0.30* <0.30*  CALCIUM 8.7*  --  8.7* 8.7*  MG 1.6* 2.1 1.9 1.9  PHOS  --   --  2.4* 3.1    ABG: No results for input(s): PHART, PCO2ART, PO2ART, HCO3, O2SAT in the last 168 hours.  Liver Function Tests: No results for input(s): AST, ALT, ALKPHOS, BILITOT, PROT, ALBUMIN in the last 168 hours. No results for input(s): LIPASE, AMYLASE in the last 168 hours. No results for input(s): AMMONIA in the last 168 hours.  CBC: Recent Labs  Lab 04/28/18 0602 04/30/18 0600  WBC 16.4* 8.1  HGB 9.4* 8.1*  HCT 29.7* 26.8*  MCV 88.9 89.0   PLT 192 156    Cardiac Enzymes: No results for input(s): CKTOTAL, CKMB, CKMBINDEX, TROPONINI in the last 168 hours.  BNP (last 3 results) No results for input(s): BNP in the last 8760 hours.  ProBNP (last 3 results) No results for input(s): PROBNP in the last 8760 hours.  Radiological Exams: No results found.  Assessment/Plan Active Problems:   Disorders of diaphragm   Atrial fibrillation, chronic   Acute on chronic respiratory failure with hypoxia (HCC)   Incomplete quadriplegia at C5-6 level (HCC)   History of stroke   1. Acute on chronic respiratory failure with hypoxia she is at baseline not able to wean continue full support on pressure assist control 2. Chronic atrial fibrillation rate controlled 3. C5-6 quadriplegia at baseline continue present management 4. History of stroke unchanged 5. Diaphragmatic paralysis at baseline   I have personally seen and evaluated the patient, evaluated laboratory and imaging results, formulated the assessment and plan and placed orders. The Patient requires high complexity decision making for assessment and support.  Case was discussed on Rounds with the Respiratory Therapy Staff  Allyne Gee, MD [facility] Pulmonary Critical Care Medicine Sleep Medicine

## 2018-05-03 ENCOUNTER — Other Ambulatory Visit (HOSPITAL_COMMUNITY): Payer: Self-pay

## 2018-05-03 NOTE — Progress Notes (Signed)
  Echocardiogram 2D Echocardiogram has been performed.  Lindsay Mills 05/03/2018, 3:03 PM

## 2018-05-03 NOTE — Progress Notes (Signed)
Pulmonary Critical Care Medicine [place]   PULMONARY CRITICAL CARE SERVICE  PROGRESS NOTE  Date of Service: 05/03/2018  Lindsay Mills  [id number]  DOB: 03/02/1946   DOA: 04/09/2018  Referring Physician: Merton Border, MD  HPI: Lindsay Mills is a 73 y.o. female seen for follow up of Acute on Chronic Respiratory Failure.  She remains at baseline on assist control mode has been on 30% FiO2 with a tidal volume of 400 she is tolerating the current settings well  Medications: Reviewed on Rounds  Physical Exam:  Vitals: Temperature 97.0 pulse 117 respiratory 19 blood pressure 127/49 saturations 100%  Ventilator Settings currently on assist control FiO2 30% tidal volume 400 PEEP 5  . General: Comfortable at this time . Eyes: Grossly normal lids, irises & conjunctiva . ENT: grossly tongue is normal . Neck: no obvious mass . Cardiovascular: S1 S2 normal no gallop . Respiratory: No rhonchi or rales are noted at this time . Abdomen: soft . Skin: no rash seen on limited exam . Musculoskeletal: not rigid . Psychiatric:unable to assess . Neurologic: no seizure no involuntary movements         Lab Data:   Basic Metabolic Panel: Recent Labs  Lab 04/28/18 0602 04/29/18 0632 04/30/18 0600 05/01/18 1128  NA 135  --  135 140  K 4.5  --  4.3 4.2  CL 94*  --  96* 100  CO2 31  --  33* 31  GLUCOSE 135*  --  130* 119*  BUN 22  --  24* 24*  CREATININE <0.30*  --  <0.30* <0.30*  CALCIUM 8.7*  --  8.7* 8.7*  MG 1.6* 2.1 1.9 1.9  PHOS  --   --  2.4* 3.1    ABG: No results for input(s): PHART, PCO2ART, PO2ART, HCO3, O2SAT in the last 168 hours.  Liver Function Tests: No results for input(s): AST, ALT, ALKPHOS, BILITOT, PROT, ALBUMIN in the last 168 hours. No results for input(s): LIPASE, AMYLASE in the last 168 hours. No results for input(s): AMMONIA in the last 168 hours.  CBC: Recent Labs  Lab 04/28/18 0602 04/30/18 0600  WBC 16.4* 8.1  HGB  9.4* 8.1*  HCT 29.7* 26.8*  MCV 88.9 89.0  PLT 192 156    Cardiac Enzymes: No results for input(s): CKTOTAL, CKMB, CKMBINDEX, TROPONINI in the last 168 hours.  BNP (last 3 results) No results for input(s): BNP in the last 8760 hours.  ProBNP (last 3 results) No results for input(s): PROBNP in the last 8760 hours.  Radiological Exams: No results found.  Assessment/Plan Active Problems:   Disorders of diaphragm   Atrial fibrillation, chronic   Acute on chronic respiratory failure with hypoxia (HCC)   Incomplete quadriplegia at C5-6 level (HCC)   History of stroke   1. Acute on chronic respiratory failure with hypoxia we will continue with full support on the ventilator currently requiring 30% FiO2 titrate oxygen as tolerated titrate PEEP as tolerated 2. Chronic atrial fibrillation rate is controlled we will continue to follow 3. C5-6 quadriplegia at baseline 4. History of stroke unchanged 5. Diaphragmatic paralysis at baseline   I have personally seen and evaluated the patient, evaluated laboratory and imaging results, formulated the assessment and plan and placed orders. The Patient requires high complexity decision making for assessment and support.  Case was discussed on Rounds with the Respiratory Therapy Staff  Allyne Gee, MD [facility] Pulmonary Critical Care Medicine Sleep Medicine

## 2018-05-04 LAB — VANCOMYCIN, TROUGH: Vancomycin Tr: 17 ug/mL (ref 15–20)

## 2018-05-04 NOTE — Progress Notes (Addendum)
Pulmonary Critical Care Medicine [place]   PULMONARY CRITICAL CARE SERVICE  PROGRESS NOTE  Date of Service: 05/04/2018  Lindsay Mills  [id number]  DOB: Oct 24, 1945   DOA: 04/09/2018  Referring Physician: Merton Border, MD  HPI: Lindsay Mills is a 73 y.o. female seen for follow up of Acute on Chronic Respiratory Failure.  Patient is doing fair and remains on assist control mode with an FiO2 of 30% currently.  She appears to be tolerating the settings well.  Medications: Reviewed on Rounds  Physical Exam:  Vitals: Pulse 110 respirations 20 blood pressure 151/80 O2 sat 98% temp 97.7  Ventilator Settings ventilator mode AC PC FiO2 30% tidal volume 350 rate of 18 PEEP of 5  . General: Comfortable at this time . Eyes: Grossly normal lids, irises & conjunctiva . ENT: grossly tongue is normal . Neck: no obvious mass . Cardiovascular: S1 S2 normal no gallop . Respiratory: No wheezes or rhonchi noted . Abdomen: soft . Skin: no rash seen on limited exam . Musculoskeletal: not rigid . Psychiatric:unable to assess . Neurologic: no seizure no involuntary movements         Lab Data:   Basic Metabolic Panel: Recent Labs  Lab 04/28/18 0602 04/29/18 0632 04/30/18 0600 05/01/18 1128  NA 135  --  135 140  K 4.5  --  4.3 4.2  CL 94*  --  96* 100  CO2 31  --  33* 31  GLUCOSE 135*  --  130* 119*  BUN 22  --  24* 24*  CREATININE <0.30*  --  <0.30* <0.30*  CALCIUM 8.7*  --  8.7* 8.7*  MG 1.6* 2.1 1.9 1.9  PHOS  --   --  2.4* 3.1    ABG: No results for input(s): PHART, PCO2ART, PO2ART, HCO3, O2SAT in the last 168 hours.  Liver Function Tests: No results for input(s): AST, ALT, ALKPHOS, BILITOT, PROT, ALBUMIN in the last 168 hours. No results for input(s): LIPASE, AMYLASE in the last 168 hours. No results for input(s): AMMONIA in the last 168 hours.  CBC: Recent Labs  Lab 04/28/18 0602 04/30/18 0600  WBC 16.4* 8.1  HGB 9.4* 8.1*  HCT  29.7* 26.8*  MCV 88.9 89.0  PLT 192 156    Cardiac Enzymes: No results for input(s): CKTOTAL, CKMB, CKMBINDEX, TROPONINI in the last 168 hours.  BNP (last 3 results) No results for input(s): BNP in the last 8760 hours.  ProBNP (last 3 results) No results for input(s): PROBNP in the last 8760 hours.  Radiological Exams: No results found.  Assessment/Plan Active Problems:   Disorders of diaphragm   Atrial fibrillation, chronic   Acute on chronic respiratory failure with hypoxia (HCC)   Incomplete quadriplegia at C5-6 level (HCC)   History of stroke   1. Acute on chronic respiratory failure with hypoxia continue with support on ventilator.  Currently requiring 30% FiO2 titrate oxygen as needed as well as PEEP. 2. Chronic atrial fibrillation rate is controlled continue to follow 3. C5-6 quadriplegia at baseline 4. History of stroke unchanged 5. Diaphragmatic paralysis at baseline   I have personally seen and evaluated the patient, evaluated laboratory and imaging results, formulated the assessment and plan and placed orders. The Patient requires high complexity decision making for assessment and support.  Case was discussed on Rounds with the Respiratory Therapy Staff  Allyne Gee, MD [facility] Pulmonary Critical Care Medicine Sleep Medicine

## 2018-05-05 NOTE — Progress Notes (Addendum)
Pulmonary Critical Care Medicine [place]   PULMONARY CRITICAL CARE SERVICE  PROGRESS NOTE  Date of Service: 05/05/2018  Lindsay Mills  [id number]  DOB: [birth year]   DOA: 04/09/2018  Referring Physician: Merton Border, MD  HPI: Lindsay Mills is a 73 y.o. female seen for follow up of Acute on Chronic Respiratory Failure.  Patient continues to be doing fair and remains on assist control mode on the ventilator with FiO2 of 30% currently.  She is tolerating the settings well.  Medications: Reviewed on Rounds  Physical Exam:  Vitals: Pulse 94 respirations 20 blood pressure 114/66 O2 sat 90% temp 98.0  Ventilator Settings ventilator mode AC PC rate 18 IP 15 PEEP of 5 FiO2 30%  . General: Comfortable at this time . Eyes: Grossly normal lids, irises & conjunctiva . ENT: grossly tongue is normal . Neck: no obvious mass . Cardiovascular: S1 S2 normal no gallop . Respiratory: No rales or rhonchi noted . Abdomen: soft . Skin: no rash seen on limited exam . Musculoskeletal: not rigid . Psychiatric:unable to assess . Neurologic: no seizure no involuntary movements         Lab Data:   Basic Metabolic Panel: Recent Labs  Lab 04/29/18 0632 04/30/18 0600 05/01/18 1128  NA  --  135 140  K  --  4.3 4.2  CL  --  96* 100  CO2  --  33* 31  GLUCOSE  --  130* 119*  BUN  --  24* 24*  CREATININE  --  <0.30* <0.30*  CALCIUM  --  8.7* 8.7*  MG 2.1 1.9 1.9  PHOS  --  2.4* 3.1    ABG: No results for input(s): PHART, PCO2ART, PO2ART, HCO3, O2SAT in the last 168 hours.  Liver Function Tests: No results for input(s): AST, ALT, ALKPHOS, BILITOT, PROT, ALBUMIN in the last 168 hours. No results for input(s): LIPASE, AMYLASE in the last 168 hours. No results for input(s): AMMONIA in the last 168 hours.  CBC: Recent Labs  Lab 04/30/18 0600  WBC 8.1  HGB 8.1*  HCT 26.8*  MCV 89.0  PLT 156    Cardiac Enzymes: No results for input(s): CKTOTAL, CKMB,  CKMBINDEX, TROPONINI in the last 168 hours.  BNP (last 3 results) No results for input(s): BNP in the last 8760 hours.  ProBNP (last 3 results) No results for input(s): PROBNP in the last 8760 hours.  Radiological Exams: No results found.  Assessment/Plan Active Problems:   Disorders of diaphragm   Atrial fibrillation, chronic   Acute on chronic respiratory failure with hypoxia (HCC)   Incomplete quadriplegia at C5-6 level (HCC)   History of stroke   1. Acute on chronic respiratory failure with hypoxia continue ventilator support.  Patient is currently requiring 30% FiO2, continue to titrate oxygen PEEP as tolerated. 2. Chronic atrial fibrillation rate is controlled continue to follow 3. C5-6 quadriplegia at baseline 4. History of stroke unchanged 5. Diaphragmatic paralysis at baseline   I have personally seen and evaluated the patient, evaluated laboratory and imaging results, formulated the assessment and plan and placed orders. The Patient requires high complexity decision making for assessment and support.  Case was discussed on Rounds with the Respiratory Therapy Staff  Allyne Gee, MD [facility] Pulmonary Critical Care Medicine Sleep Medicine

## 2018-05-06 LAB — BASIC METABOLIC PANEL
Anion gap: 7 (ref 5–15)
BUN: 13 mg/dL (ref 8–23)
CO2: 30 mmol/L (ref 22–32)
Calcium: 8.6 mg/dL — ABNORMAL LOW (ref 8.9–10.3)
Chloride: 101 mmol/L (ref 98–111)
Creatinine, Ser: <0.3 mg/dL — ABNORMAL LOW (ref 0.44–1.00)
Glucose, Bld: 116 mg/dL — ABNORMAL HIGH (ref 70–99)
Potassium: 3.4 mmol/L — ABNORMAL LOW (ref 3.5–5.1)
Sodium: 138 mmol/L (ref 135–145)

## 2018-05-06 LAB — CBC
HCT: 28.3 % — ABNORMAL LOW (ref 36.0–46.0)
Hemoglobin: 8.8 g/dL — ABNORMAL LOW (ref 12.0–15.0)
MCH: 27.8 pg (ref 26.0–34.0)
MCHC: 31.1 g/dL (ref 30.0–36.0)
MCV: 89.3 fL (ref 80.0–100.0)
NRBC: 0 % (ref 0.0–0.2)
Platelets: 155 10*3/uL (ref 150–400)
RBC: 3.17 MIL/uL — AB (ref 3.87–5.11)
RDW: 19.6 % — ABNORMAL HIGH (ref 11.5–15.5)
WBC: 6.5 10*3/uL (ref 4.0–10.5)

## 2018-05-06 LAB — MAGNESIUM: Magnesium: 1.8 mg/dL (ref 1.7–2.4)

## 2018-05-06 NOTE — Progress Notes (Signed)
Pulmonary Critical Care Medicine [place]   PULMONARY CRITICAL CARE SERVICE  PROGRESS NOTE  Date of Service: 05/06/2018  Lindsay Mills  [id number]  DOB: 03-08-1946   DOA: 04/09/2018  Referring Physician: Merton Border, MD  HPI: Lindsay Mills is a 73 y.o. female seen for follow up of Acute on Chronic Respiratory Failure.  At this time she is comfortable remains on the ventilator she is afebrile  Medications: Reviewed on Rounds  Physical Exam:  Vitals: Temperature 98.0 pulse 109 respiratory 19 blood pressure 146/80 saturations 100%  Ventilator Settings mode ventilation pressure assist control FiO2 30% inspiratory pressure 15 PEEP 5  . General: Comfortable at this time . Eyes: Grossly normal lids, irises & conjunctiva . ENT: grossly tongue is normal . Neck: no obvious mass . Cardiovascular: S1 S2 normal no gallop . Respiratory: Coarse breath sounds no rhonchi are noted . Abdomen: soft . Skin: no rash seen on limited exam . Musculoskeletal: not rigid . Psychiatric:unable to assess . Neurologic: no seizure no involuntary movements         Lab Data:   Basic Metabolic Panel: Recent Labs  Lab 04/30/18 0600 05/01/18 1128 05/06/18 0551  NA 135 140 138  K 4.3 4.2 3.4*  CL 96* 100 101  CO2 33* 31 30  GLUCOSE 130* 119* 116*  BUN 24* 24* 13  CREATININE <0.30* <0.30* <0.30*  CALCIUM 8.7* 8.7* 8.6*  MG 1.9 1.9 1.8  PHOS 2.4* 3.1  --     ABG: No results for input(s): PHART, PCO2ART, PO2ART, HCO3, O2SAT in the last 168 hours.  Liver Function Tests: No results for input(s): AST, ALT, ALKPHOS, BILITOT, PROT, ALBUMIN in the last 168 hours. No results for input(s): LIPASE, AMYLASE in the last 168 hours. No results for input(s): AMMONIA in the last 168 hours.  CBC: Recent Labs  Lab 04/30/18 0600 05/06/18 0551  WBC 8.1 6.5  HGB 8.1* 8.8*  HCT 26.8* 28.3*  MCV 89.0 89.3  PLT 156 155    Cardiac Enzymes: No results for input(s):  CKTOTAL, CKMB, CKMBINDEX, TROPONINI in the last 168 hours.  BNP (last 3 results) No results for input(s): BNP in the last 8760 hours.  ProBNP (last 3 results) No results for input(s): PROBNP in the last 8760 hours.  Radiological Exams: No results found.  Assessment/Plan Active Problems:   Disorders of diaphragm   Atrial fibrillation, chronic   Acute on chronic respiratory failure with hypoxia (HCC)   Incomplete quadriplegia at C5-6 level (HCC)   History of stroke   1. Acute on chronic respiratory failure with hypoxia we will continue with full vent support on pressure assist control currently is requiring 30% FiO2 with a PEEP of 5 2. Chronic atrial fibrillation rate controlled at this time 3. Diaphragmatic paralysis baseline on chronic vent dependent 4. Incomplete quadriplegia with chronic vent dependent 5. History of stroke continue rehab as tolerated   I have personally seen and evaluated the patient, evaluated laboratory and imaging results, formulated the assessment and plan and placed orders. The Patient requires high complexity decision making for assessment and support.  Case was discussed on Rounds with the Respiratory Therapy Staff  Allyne Gee, MD [facility] Pulmonary Critical Care Medicine Sleep Medicine

## 2018-05-07 LAB — POTASSIUM: Potassium: 4.1 mmol/L (ref 3.5–5.1)

## 2018-05-07 LAB — CULTURE, BLOOD (ROUTINE X 2)
Culture: NO GROWTH
Culture: NO GROWTH
Special Requests: ADEQUATE
Special Requests: ADEQUATE

## 2018-05-07 NOTE — Progress Notes (Addendum)
Pulmonary Critical Care Medicine [place]   PULMONARY CRITICAL CARE SERVICE  PROGRESS NOTE  Date of Service: 05/07/2018  BRYLINN TEANEY  [id number]  DOB: 1945/09/09   DOA: 04/09/2018  Referring Physician: Merton Border, MD  HPI: Lindsay Mills is a 73 y.o. female seen for follow up of Acute on Chronic Respiratory Failure.  Patient is comfortable at this time on ventilator.  She is most likely at her baseline and should be discharged home tomorrow.  Medications: Reviewed on Rounds  Physical Exam:  Vitals: Pulse 90 respirations 18 blood pressure 150/60 O2 sat 96% temp 96.8  Ventilator Settings ventilator mode AC PC rate 18 IP 15 PEEP of 5 FiO2 30%  . General: Comfortable at this time . Eyes: Grossly normal lids, irises & conjunctiva . ENT: grossly tongue is normal . Neck: no obvious mass . Cardiovascular: S1 S2 normal no gallop . Respiratory: No rales or rhonchi noted . Abdomen: soft . Skin: no rash seen on limited exam . Musculoskeletal: not rigid . Psychiatric:unable to assess . Neurologic: no seizure no involuntary movements         Lab Data:   Basic Metabolic Panel: Recent Labs  Lab 05/01/18 1128 05/06/18 0551 05/07/18 0451  NA 140 138  --   K 4.2 3.4* 4.1  CL 100 101  --   CO2 31 30  --   GLUCOSE 119* 116*  --   BUN 24* 13  --   CREATININE <0.30* <0.30*  --   CALCIUM 8.7* 8.6*  --   MG 1.9 1.8  --   PHOS 3.1  --   --     ABG: No results for input(s): PHART, PCO2ART, PO2ART, HCO3, O2SAT in the last 168 hours.  Liver Function Tests: No results for input(s): AST, ALT, ALKPHOS, BILITOT, PROT, ALBUMIN in the last 168 hours. No results for input(s): LIPASE, AMYLASE in the last 168 hours. No results for input(s): AMMONIA in the last 168 hours.  CBC: Recent Labs  Lab 05/06/18 0551  WBC 6.5  HGB 8.8*  HCT 28.3*  MCV 89.3  PLT 155    Cardiac Enzymes: No results for input(s): CKTOTAL, CKMB, CKMBINDEX, TROPONINI in the  last 168 hours.  BNP (last 3 results) No results for input(s): BNP in the last 8760 hours.  ProBNP (last 3 results) No results for input(s): PROBNP in the last 8760 hours.  Radiological Exams: No results found.  Assessment/Plan Active Problems:   Disorders of diaphragm   Atrial fibrillation, chronic   Acute on chronic respiratory failure with hypoxia (HCC)   Incomplete quadriplegia at C5-6 level (HCC)   History of stroke   1. Acute on chronic respiratory failure with hypoxia continue full vent support on pressure assist control.  Current FiO2 is 30% with a PEEP of 5 2. Chronic atrial fibrillation rate controlled this time 3. Diaphragmatic paralysis baseline on chronic vent at home. 4. Incomplete quadriplegia chronic vent dependent 5. History of stroke continue rehab as tolerated   I have personally seen and evaluated the patient, evaluated laboratory and imaging results, formulated the assessment and plan and placed orders. The Patient requires high complexity decision making for assessment and support.  Case was discussed on Rounds with the Respiratory Therapy Staff  Allyne Gee, MD [facility] Pulmonary Critical Care Medicine Sleep Medicine

## 2018-05-08 LAB — VANCOMYCIN, TROUGH
Vancomycin Tr: 46 ug/mL (ref 15–20)
Vancomycin Tr: 13 ug/mL — ABNORMAL LOW (ref 15–20)

## 2018-05-08 NOTE — Consult Note (Signed)
Referring Physician:  [name] is an 73 y.o. female.                       Chief Complaint: Tachycardia  HPI: 73 year old female with Chronic atrial fibrillation, ventilator dependent respiratory failure, diaphragmatic paralysis, Incomplete quadriplegia, stroke, moderate MR and TR and asthma had atrial fibrillation with RVR treated with chronic diltiazem use and recent IV metoprolol. Her heart rate is well controlled and she did not had respiratory distress.  Past Medical History:  Diagnosis Date  . Acute on chronic respiratory failure with hypoxia ([place]) 09/20/2017  . Asthma   . Decreased hearing of right ear    not dx  . Depression   . Headache    otc med prn  . History of stroke 09/20/2017  . Hypothyroidism   . Incomplete quadriplegia at C5-6 level ([place]) 09/20/2017  . Multiple allergies   . Scoliosis   . Seizures ([place])    well controlled on medication, last seizure at age 72  . Stroke ([place]) 08/2013   mini stroke, no problems but has some numbness on right check and lower right arm, did not affect her strength   . SVD (spontaneous vaginal delivery)    x 1      Past Surgical History:  Procedure Laterality Date  . BACK SURGERY     x 2, rods in back from shoulder blades to hips  . COLONOSCOPY    . DILATION AND CURETTAGE OF UTERUS    . ganglion cyst removed     right ring finer  . HYSTEROSCOPY W/D&C N/A 10/23/2014   Procedure: DILATATION AND CURETTAGE /HYSTEROSCOPY;  Surgeon: Allyn Kenner, DO;  Location: [facility];  Service: Gynecology;  Laterality: N/A;  . IR REPLC GASTRO/COLONIC TUBE PERCUT W/FLUORO  04/19/2018  . LAPAROSCOPIC INSERTION GASTROSTOMY TUBE N/A 10/24/2017   Procedure: LAPAROSCOPIC INSERTION GASTROSTOMY TUBE;  Surgeon: Greer Pickerel, MD;  Location: [facility];  Service: General;  Laterality: N/A;  . left foot surgery  2006  . right foot surgery  2014  . TUBAL LIGATION  1981  . UPPER GI ENDOSCOPY      No family history on file. Social History:  reports that  she quit smoking about 42 years ago. Her smoking use included cigarettes. She has a 2.25 pack-year smoking history. She has never used smokeless tobacco. She reports that she does not drink alcohol or use drugs.  Allergies:  Allergies  Allergen Reactions  . Codeine Anxiety  . Phenytoin Rash    Medications Prior to Admission  Medication Sig Dispense Refill  . albuterol (PROVENTIL HFA;VENTOLIN HFA) 108 (90 BASE) MCG/ACT inhaler Inhale 1 puff into the lungs every 6 (six) hours as needed for wheezing or shortness of breath.    Marland Kitchen apixaban (ELIQUIS) 5 MG TABS tablet Take 1 tablet (5 mg total) by mouth 2 (two) times daily. 60 tablet 1  . atorvastatin (LIPITOR) 40 MG tablet Take 1 tablet by mouth at bedtime.   6  . b complex vitamins tablet Take 1 tablet by mouth daily.    . budesonide-formoterol (SYMBICORT) 80-4.5 MCG/ACT inhaler Inhale 2 puffs into the lungs 2 (two) times daily.    . Calcium Carbonate-Vitamin D (CALCIUM-D) 600-400 MG-UNIT TABS Take 2 capsules by mouth 2 (two) times daily.    . flintstones complete (FLINTSTONES) 60 MG chewable tablet Chew 1 tablet by mouth daily.    . fluticasone (FLONASE) 50 MCG/ACT nasal spray Place 1 spray into both  nostrils 2 (two) times daily.    . folic acid (FOLVITE) 1 MG tablet Take 1 mg by mouth daily.  6  . levothyroxine (SYNTHROID, LEVOTHROID) 100 MCG tablet Take 100 mcg by mouth every other day. Rotates with levothyroxine 125 mcg.    . montelukast (SINGULAIR) 10 MG tablet Take 10 mg by mouth daily.    . primidone (MYSOLINE) 250 MG tablet Take 250 mg by mouth 2 (two) times daily.    . sertraline (ZOLOFT) 100 MG tablet Take 150 mg by mouth daily.    . VESICARE 5 MG tablet Take 5 mg by mouth at bedtime.   3  . vitamin B-12 (CYANOCOBALAMIN) 1000 MCG tablet Take 1,000 mcg by mouth daily.    . Vitamin D, Ergocalciferol, (DRISDOL) 50000 UNITS CAPS capsule Take 1 capsule by mouth once a week.  8    Results for orders placed or performed during the hospital  encounter of 04/09/18 (from the past 48 hour(s))  Potassium     Status: None   Collection Time: 05/07/18  4:51 AM  Result Value Ref Range   Potassium 4.1 3.5 - 5.1 mmol/L    Comment: DELTA CHECK NOTED Performed at [facility], [place] [address], [place], [place] [address]   Vancomycin, trough     Status: Abnormal   Collection Time: 05/08/18 12:35 AM  Result Value Ref Range   Vancomycin Tr 46 (HH) 15 - 20 ug/mL    Comment: CRITICAL RESULT CALLED TO, READ BACK BY AND VERIFIED WITH: COLLIER,D RN 05/08/2018 0121 JORDANS Performed at [facility], [place] [address] [place], [place] [address]    No results found.  Review Of Systems Constitutional: Positive fever, chills, weight loss. Eyes: No vision change, wears glasses. No discharge or pain. Ears: No hearing loss, No tinnitus. Respiratory: Positive asthma, no COPD, positive pneumonias. Positive shortness of breath. No hemoptysis. Cardiovascular: No chest pain, positive palpitation, leg edema. Gastrointestinal: No nausea, vomiting, diarrhea, constipation. No GI bleed. No hepatitis. Genitourinary: No dysuria, hematuria, kidney stone. No incontinance. Neurological: No headache, positive stroke, seizures.  Psychiatry: No psych facility admission for anxiety, depression, suicide. No detox. Skin: No rash. Musculoskeletal: Positive joint pain, fibromyalgia. No neck pain, back pain. Lymphadenopathy: No lymphadenopathy. Hematology: Positive anemia.   There were no vitals taken for this visit. There is no height or weight on file to calculate BMI. See Chart. General appearance: alert, cooperative, appears stated age and no distress Head: Normocephalic, atraumatic. Tongue mid line, pale and dry. Eyes: Blue eyes, pale conjunctiva, corneas clear. PERRL, EOM's intact. Neck: No adenopathy, no carotid bruit, no JVD, supple, symmetrical, trachea midline and thyroid not enlarged. Tracheostomy tube in place. Resp: Clearing to auscultation  bilaterally. Cardio: Irregular rate and rhythm, S1, S2 normal, III/VI systolic murmur, no click, rub or gallop GI: Soft, non-tender; bowel sounds normal; no organomegaly. G tube in place. Extremities: No edema, cyanosis or clubbing. Skin: Warm and dry. Decreased turgor. Neurologic: Alert and oriented X 3, severely decreased strength. Bed ridden.  Assessment/Plan Atrial fibrillation with controlled ventricular response, [id number] score 4 Chronic respiratory failure with hypoxemia Asthma Stroke Partial quadriplegia Mild MR and TR Severe protein calorie malnutrition Dehydration  May use small dose metoprolol 25 mg. twice daily for heart rate(60 to 110) control. Increase free water to 40 ml q 2 hr.  Birdie Riddle, MD  05/08/2018, 1:16 PM

## 2018-05-08 NOTE — Progress Notes (Addendum)
Pulmonary Critical Care Medicine [facility]   PULMONARY CRITICAL CARE SERVICE  PROGRESS NOTE  Date of Service: 05/08/2018  Lindsay Mills  [id number]  DOB: 1945/11/12   DOA: 04/09/2018  Referring Physician: Merton Border, MD  HPI: Lindsay Mills is a 73 y.o. female seen for follow up of Acute on Chronic Respiratory Failure.  Patient continues to do well and is comfortable on ventilator.  We are still expecting discharge at this time.  Medications: Reviewed on Rounds  Physical Exam:  Vitals: Pulse 97 respirations 14 BP 156/67 O2 sat on percent temp 96.3  Ventilator Settings ventilator mode AC PC respirations 18 pressure 15 PEEP of 5 FiO2 30% pulling volumes 350  . General: Comfortable at this time . Eyes: Grossly normal lids, irises & conjunctiva . ENT: grossly tongue is normal . Neck: no obvious mass . Cardiovascular: S1 S2 normal no gallop . Respiratory: No rales or rhonchi noted . Abdomen: soft . Skin: no rash seen on limited exam . Musculoskeletal: not rigid . Psychiatric:unable to assess . Neurologic: no seizure no involuntary movements         Lab Data:   Basic Metabolic Panel: Recent Labs  Lab 05/06/18 0551 05/07/18 0451  NA 138  --   K 3.4* 4.1  CL 101  --   CO2 30  --   GLUCOSE 116*  --   BUN 13  --   CREATININE <0.30*  --   CALCIUM 8.6*  --   MG 1.8  --     ABG: No results for input(s): PHART, PCO2ART, PO2ART, HCO3, O2SAT in the last 168 hours.  Liver Function Tests: No results for input(s): AST, ALT, ALKPHOS, BILITOT, PROT, ALBUMIN in the last 168 hours. No results for input(s): LIPASE, AMYLASE in the last 168 hours. No results for input(s): AMMONIA in the last 168 hours.  CBC: Recent Labs  Lab 05/06/18 0551  WBC 6.5  HGB 8.8*  HCT 28.3*  MCV 89.3  PLT 155    Cardiac Enzymes: No results for input(s): CKTOTAL, CKMB, CKMBINDEX, TROPONINI in the last 168 hours.  BNP (last 3 results) No results for  input(s): BNP in the last 8760 hours.  ProBNP (last 3 results) No results for input(s): PROBNP in the last 8760 hours.  Radiological Exams: No results found.  Assessment/Plan Active Problems:   Disorders of diaphragm   Atrial fibrillation, chronic   Acute on chronic respiratory failure with hypoxia (HCC)   Incomplete quadriplegia at C5-6 level (HCC)   History of stroke   1. Acute on chronic respiratory failure with hypoxia continue full vent support pressure assist control.  Expecting discharge to home with home ventilator. 2. Chronic atrial fibrillation rate control this time 3. Diaphragmatic paralysis baseline on chronic vent at home 4. Incomplete quadriplegia chronic vent dependent 5. History of stroke continue rehab as tolerated   I have personally seen and evaluated the patient, evaluated laboratory and imaging results, formulated the assessment and plan and placed orders. The Patient requires high complexity decision making for assessment and support.  Case was discussed on Rounds with the Respiratory Therapy Staff  Allyne Gee, MD [facility] Pulmonary Critical Care Medicine Sleep Medicine

## 2018-05-09 NOTE — Progress Notes (Signed)
Pulmonary Critical Care Medicine [place]   PULMONARY CRITICAL CARE SERVICE  PROGRESS NOTE  Date of Service: 05/09/2018  VALORA NORELL  [id number]  DOB: 10-05-1945   DOA: 04/09/2018  Referring Physician: Merton Border, MD  HPI: Lindsay Mills is a 73 y.o. female seen for follow up of Acute on Chronic Respiratory Failure.  She remains on full vent support.  She is currently on pressure assist control mode on 30% FiO2  Medications: Reviewed on Rounds  Physical Exam:  Vitals: Temperature 97.9 pulse 66 respiratory 18 blood pressure 124/71 saturations 100%  Ventilator Settings mode ventilation pressure assist control FiO2 30% tidal volume 343 PEEP 5  . General: Comfortable at this time . Eyes: Grossly normal lids, irises & conjunctiva . ENT: grossly tongue is normal . Neck: no obvious mass . Cardiovascular: S1 S2 normal no gallop . Respiratory: Coarse breath sounds few rhonchi are noted . Abdomen: soft . Skin: no rash seen on limited exam . Musculoskeletal: not rigid . Psychiatric:unable to assess . Neurologic: no seizure no involuntary movements         Lab Data:   Basic Metabolic Panel: Recent Labs  Lab 05/06/18 0551 05/07/18 0451  NA 138  --   K 3.4* 4.1  CL 101  --   CO2 30  --   GLUCOSE 116*  --   BUN 13  --   CREATININE <0.30*  --   CALCIUM 8.6*  --   MG 1.8  --     ABG: No results for input(s): PHART, PCO2ART, PO2ART, HCO3, O2SAT in the last 168 hours.  Liver Function Tests: No results for input(s): AST, ALT, ALKPHOS, BILITOT, PROT, ALBUMIN in the last 168 hours. No results for input(s): LIPASE, AMYLASE in the last 168 hours. No results for input(s): AMMONIA in the last 168 hours.  CBC: Recent Labs  Lab 05/06/18 0551  WBC 6.5  HGB 8.8*  HCT 28.3*  MCV 89.3  PLT 155    Cardiac Enzymes: No results for input(s): CKTOTAL, CKMB, CKMBINDEX, TROPONINI in the last 168 hours.  BNP (last 3 results) No results for  input(s): BNP in the last 8760 hours.  ProBNP (last 3 results) No results for input(s): PROBNP in the last 8760 hours.  Radiological Exams: No results found.  Assessment/Plan Active Problems:   Disorders of diaphragm   Atrial fibrillation, chronic   Acute on chronic respiratory failure with hypoxia (HCC)   Incomplete quadriplegia at C5-6 level (HCC)   History of stroke   1. Acute on chronic respiratory failure with hypoxia we will continue with full vent support she is at her baseline 2. Diaphragmatic paralysis unchanged continue supportive care 3. Chronic atrial fibrillation rate controlled 4. C5-6 quad therapy as tolerated 5. History of stroke unchanged   I have personally seen and evaluated the patient, evaluated laboratory and imaging results, formulated the assessment and plan and placed orders. The Patient requires high complexity decision making for assessment and support.  Case was discussed on Rounds with the Respiratory Therapy Staff  Allyne Gee, MD [facility] Pulmonary Critical Care Medicine Sleep Medicine

## 2018-05-10 ENCOUNTER — Other Ambulatory Visit (HOSPITAL_COMMUNITY): Payer: Self-pay

## 2018-05-10 DIAGNOSIS — J189 Pneumonia, unspecified organism: Secondary | ICD-10-CM | POA: Diagnosis not present

## 2018-05-10 DIAGNOSIS — J986 Disorders of diaphragm: Secondary | ICD-10-CM | POA: Diagnosis not present

## 2018-05-10 NOTE — Progress Notes (Signed)
Pulmonary Critical Care Medicine [place]   PULMONARY CRITICAL CARE SERVICE  PROGRESS NOTE  Date of Service: 05/10/2018  Lindsay Mills  [id number]  DOB: Jul 13, 1945   DOA: 04/09/2018  Referring Physician: Carron Curie, MD  HPI: Lindsay Mills is a 73 y.o. female seen for follow up of Acute on Chronic Respiratory Failure.  Patient right now is comfortable without distress is on pressure assist control mode on 30% FiO2 tidal volume 343 patient has been at her baseline  Medications: Reviewed on Rounds  Physical Exam:  Vitals: Temperature 98.6 pulse 90 respiratory 26 blood pressure 152/89 saturations 97%  Ventilator Settings mode ventilation pressure assist control FiO2 30% tidal volume 343 PEEP 5  . General: Comfortable at this time . Eyes: Grossly normal lids, irises & conjunctiva . ENT: grossly tongue is normal . Neck: no obvious mass . Cardiovascular: S1 S2 normal no gallop . Respiratory: Scattered rhonchi expansion is equal . Abdomen: soft . Skin: no rash seen on limited exam . Musculoskeletal: not rigid . Psychiatric:unable to assess . Neurologic: no seizure no involuntary movements         Lab Data:   Basic Metabolic Panel: Recent Labs  Lab 05/06/18 0551 05/07/18 0451  NA 138  --   K 3.4* 4.1  CL 101  --   CO2 30  --   GLUCOSE 116*  --   BUN 13  --   CREATININE <0.30*  --   CALCIUM 8.6*  --   MG 1.8  --     ABG: No results for input(s): PHART, PCO2ART, PO2ART, HCO3, O2SAT in the last 168 hours.  Liver Function Tests: No results for input(s): AST, ALT, ALKPHOS, BILITOT, PROT, ALBUMIN in the last 168 hours. No results for input(s): LIPASE, AMYLASE in the last 168 hours. No results for input(s): AMMONIA in the last 168 hours.  CBC: Recent Labs  Lab 05/06/18 0551  WBC 6.5  HGB 8.8*  HCT 28.3*  MCV 89.3  PLT 155    Cardiac Enzymes: No results for input(s): CKTOTAL, CKMB, CKMBINDEX, TROPONINI in the last 168  hours.  BNP (last 3 results) No results for input(s): BNP in the last 8760 hours.  ProBNP (last 3 results) No results for input(s): PROBNP in the last 8760 hours.  Radiological Exams: Dg Chest Port 1 View  Result Date: 05/10/2018 CLINICAL DATA:  Pneumonia. EXAM: PORTABLE CHEST 1 VIEW COMPARISON:  04/30/2018. FINDINGS: Mediastinum is normal. Cardiac monitor device noted over the chest. Cardiomegaly with normal pulmonary vascularity. Mild left perihilar infiltrate/edema. Stable elevation left hemidiaphragm. Prior cervicothoracic spine fusion. Thoracolumbar spine scoliosis. IMPRESSION: 1.  Cardiomegaly.  Normal pulmonary vascularity. 2.  Mild left perihilar infiltrate/edema. 3.  Stable elevation left hemidiaphragm. Electronically Signed   By: Maisie Fus  Register   On: 05/10/2018 08:48    Assessment/Plan Active Problems:   Disorders of diaphragm   Atrial fibrillation, chronic   Acute on chronic respiratory failure with hypoxia (HCC)   Incomplete quadriplegia at C5-6 level (HCC)   History of stroke   1. Acute on chronic respiratory failure with hypoxia we will continue with the pressure assist control continue full support on 30% FiO2 with a PEEP of 5 patient is not able to wean off the vent 2. Chronic atrial fibrillation rate controlled 3. Diaphragmatic paralysis at baseline 4. C5-6 quadriplegia continue with supportive care 5. History of stroke at baseline   I have personally seen and evaluated the patient, evaluated laboratory and imaging results, formulated the  assessment and plan and placed orders. The Patient requires high complexity decision making for assessment and support.  Case was discussed on Rounds with the Respiratory Therapy Staff  Yevonne Pax, MD [facility] Pulmonary Critical Care Medicine Sleep Medicine

## 2018-05-11 LAB — BASIC METABOLIC PANEL
Anion gap: 9 (ref 5–15)
BUN: 14 mg/dL (ref 8–23)
CO2: 32 mmol/L (ref 22–32)
Calcium: 8.7 mg/dL — ABNORMAL LOW (ref 8.9–10.3)
Chloride: 97 mmol/L — ABNORMAL LOW (ref 98–111)
Creatinine, Ser: <0.3 mg/dL — ABNORMAL LOW (ref 0.44–1.00)
GLUCOSE: 130 mg/dL — AB (ref 70–99)
Potassium: 3.3 mmol/L — ABNORMAL LOW (ref 3.5–5.1)
Sodium: 138 mmol/L (ref 135–145)

## 2018-05-11 LAB — CBC
HCT: 26.5 % — ABNORMAL LOW (ref 36.0–46.0)
Hemoglobin: 8.2 g/dL — ABNORMAL LOW (ref 12.0–15.0)
MCH: 27.6 pg (ref 26.0–34.0)
MCHC: 30.9 g/dL (ref 30.0–36.0)
MCV: 89.2 fL (ref 80.0–100.0)
Platelets: 158 10*3/uL (ref 150–400)
RBC: 2.97 MIL/uL — ABNORMAL LOW (ref 3.87–5.11)
RDW: 19.4 % — ABNORMAL HIGH (ref 11.5–15.5)
WBC: 6.2 10*3/uL (ref 4.0–10.5)
nRBC: 0 % (ref 0.0–0.2)

## 2018-05-11 LAB — PHOSPHORUS: Phosphorus: 3.2 mg/dL (ref 2.5–4.6)

## 2018-05-11 LAB — MAGNESIUM: Magnesium: 1.7 mg/dL (ref 1.7–2.4)

## 2018-05-11 NOTE — Progress Notes (Addendum)
Pulmonary Critical Care Medicine [name] [place]   PULMONARY CRITICAL CARE SERVICE  PROGRESS NOTE  Date of Service: 05/11/2018  Lindsay Mills  [id number]  DOB: 10-19-1945   DOA: 04/09/2018  Referring Physician: Carron Curie, MD  HPI: Lindsay Mills is a 73 y.o. female seen for follow up of Acute on Chronic Respiratory Failure.  Patient continues to rest comfortably without distress on pressure assist control mode FiO2 30%.  Baseline.  Medications: Reviewed on Rounds  Physical Exam:  Vitals: Pulse 92 respirations 18 BP 121/72 O2 sat 90% temp 98.1  Ventilator Settings ventilator mode AC PC IP 15 rate 18 PEEP of 5 FiO2 30%  . General: Comfortable at this time . Eyes: Grossly normal lids, irises & conjunctiva . ENT: grossly tongue is normal . Neck: no obvious mass . Cardiovascular: S1 S2 normal no gallop . Respiratory: [place] rhonchi expansion is equal . Abdomen: soft . Skin: no rash seen on limited exam . Musculoskeletal: not rigid . Psychiatric:unable to assess . Neurologic: no seizure no involuntary movements         Lab Data:   Basic Metabolic Panel: Recent Labs  Lab 05/06/18 0551 05/07/18 0451 05/11/18 0808  NA 138  --  138  K 3.4* 4.1 3.3*  CL 101  --  97*  CO2 30  --  32  GLUCOSE 116*  --  130*  BUN 13  --  14  CREATININE <0.30*  --  <0.30*  CALCIUM 8.6*  --  8.7*  MG 1.8  --  1.7  PHOS  --   --  3.2    ABG: No results for input(s): PHART, PCO2ART, PO2ART, HCO3, O2SAT in the last 168 hours.  Liver Function Tests: No results for input(s): AST, ALT, ALKPHOS, BILITOT, PROT, ALBUMIN in the last 168 hours. No results for input(s): LIPASE, AMYLASE in the last 168 hours. No results for input(s): AMMONIA in the last 168 hours.  CBC: Recent Labs  Lab 05/06/18 0551 05/11/18 0808  WBC 6.5 6.2  HGB 8.8* 8.2*  HCT 28.3* 26.5*  MCV 89.3 89.2  PLT 155 158    Cardiac Enzymes: No results for input(s): CKTOTAL, CKMB, CKMBINDEX,  TROPONINI in the last 168 hours.  BNP (last 3 results) No results for input(s): BNP in the last 8760 hours.  ProBNP (last 3 results) No results for input(s): PROBNP in the last 8760 hours.  Radiological Exams: Dg Chest Port 1 View  Result Date: 05/10/2018 CLINICAL DATA:  Pneumonia. EXAM: PORTABLE CHEST 1 VIEW COMPARISON:  04/30/2018. FINDINGS: Mediastinum is normal. Cardiac monitor device noted over the chest. Cardiomegaly with normal pulmonary vascularity. Mild left perihilar infiltrate/edema. Stable elevation left hemidiaphragm. Prior cervicothoracic spine fusion. Thoracolumbar spine scoliosis. IMPRESSION: 1.  Cardiomegaly.  Normal pulmonary vascularity. 2.  Mild left perihilar infiltrate/edema. 3.  Stable elevation left hemidiaphragm. Electronically Signed   By: Maisie Fus  Register   On: 05/10/2018 08:48    Assessment/Plan Active Problems:   Disorders of diaphragm   Atrial fibrillation, chronic   Acute on chronic respiratory failure with hypoxia (HCC)   Incomplete quadriplegia at C5-6 level (HCC)   History of stroke   1. Acute on chronic respiratory failure with hypoxia continue with pressor assist control full support on 30% FiO2 with a PEEP of 5.  Patient is unable to wean any further. 2. Chronic atrial fibrillation rate controlled 3. Diaphragmatic paralysis at baseline 4. C5-6 quadriplegia continue supportive care 5. History of stroke at baseline   I have  personally seen and evaluated the patient, evaluated laboratory and imaging results, formulated the assessment and plan and placed orders. The Patient requires high complexity decision making for assessment and support.  Case was discussed on Rounds with the Respiratory Therapy Staff  Yevonne Pax, MD [facility] Pulmonary Critical Care Medicine Sleep Medicine

## 2018-05-12 LAB — BASIC METABOLIC PANEL
Anion gap: 6 (ref 5–15)
BUN: 13 mg/dL (ref 8–23)
CO2: 33 mmol/L — ABNORMAL HIGH (ref 22–32)
Calcium: 8.7 mg/dL — ABNORMAL LOW (ref 8.9–10.3)
Chloride: 98 mmol/L (ref 98–111)
Creatinine, Ser: <0.3 mg/dL — ABNORMAL LOW (ref 0.44–1.00)
GLUCOSE: 121 mg/dL — AB (ref 70–99)
Potassium: 4.4 mmol/L (ref 3.5–5.1)
Sodium: 137 mmol/L (ref 135–145)

## 2018-05-12 LAB — MAGNESIUM: Magnesium: 2.2 mg/dL (ref 1.7–2.4)

## 2018-05-12 NOTE — Progress Notes (Addendum)
Pulmonary Critical Care Medicine [place]   PULMONARY CRITICAL CARE SERVICE  PROGRESS NOTE  Date of Service: 05/12/2018  Lindsay Mills  [id number]  DOB: 05-Mar-1946   DOA: 04/09/2018  Referring Physician: Merton Border, MD  HPI: Lindsay Mills is a 73 y.o. female seen for follow up of Acute on Chronic Respiratory Failure.  Patient remains resting comfortably on ventilator at her baseline.  FiO2 remains 30%.  Minimal secretions noted.  Medications: Reviewed on Rounds  Physical Exam:  Vitals: Pulse 105 respirations 26 BP 158/71 O2 sat 96% temp 98.7  Ventilator Settings ventilator mode AC PC rate 18 IP 15 PEEP of 5 FiO2 30%  . General: Comfortable at this time . Eyes: Grossly normal lids, irises & conjunctiva . ENT: grossly tongue is normal . Neck: no obvious mass . Cardiovascular: S1 S2 normal no gallop . Respiratory: Coarse breath sounds . Abdomen: soft . Skin: no rash seen on limited exam . Musculoskeletal: not rigid . Psychiatric:unable to assess . Neurologic: no seizure no involuntary movements         Lab Data:   Basic Metabolic Panel: Recent Labs  Lab 05/06/18 0551 05/07/18 0451 05/11/18 0808 05/12/18 0625  NA 138  --  138 137  K 3.4* 4.1 3.3* 4.4  CL 101  --  97* 98  CO2 30  --  32 33*  GLUCOSE 116*  --  130* 121*  BUN 13  --  14 13  CREATININE <0.30*  --  <0.30* <0.30*  CALCIUM 8.6*  --  8.7* 8.7*  MG 1.8  --  1.7 2.2  PHOS  --   --  3.2  --     ABG: No results for input(s): PHART, PCO2ART, PO2ART, HCO3, O2SAT in the last 168 hours.  Liver Function Tests: No results for input(s): AST, ALT, ALKPHOS, BILITOT, PROT, ALBUMIN in the last 168 hours. No results for input(s): LIPASE, AMYLASE in the last 168 hours. No results for input(s): AMMONIA in the last 168 hours.  CBC: Recent Labs  Lab 05/06/18 0551 05/11/18 0808  WBC 6.5 6.2  HGB 8.8* 8.2*  HCT 28.3* 26.5*  MCV 89.3 89.2  PLT 155 158    Cardiac  Enzymes: No results for input(s): CKTOTAL, CKMB, CKMBINDEX, TROPONINI in the last 168 hours.  BNP (last 3 results) No results for input(s): BNP in the last 8760 hours.  ProBNP (last 3 results) No results for input(s): PROBNP in the last 8760 hours.  Radiological Exams: No results found.  Assessment/Plan Active Problems:   Disorders of diaphragm   Atrial fibrillation, chronic   Acute on chronic respiratory failure with hypoxia (HCC)   Incomplete quadriplegia at C5-6 level (HCC)   History of stroke   1. Acute on chronic respiratory failure with hypoxia continue pressure assist control mode with full support on 30% FiO2 PEEP of 5.  Patient is unable to wean any further.  This is her baseline. 2. Chronic atrial fibrillation rate controlled 3. Diaphragmatic paralysis at baseline 4. C5-6 quadriplegia continue supportive care 5. History of stroke at baseline   I have personally seen and evaluated the patient, evaluated laboratory and imaging results, formulated the assessment and plan and placed orders. The Patient requires high complexity decision making for assessment and support.  Case was discussed on Rounds with the Respiratory Therapy Staff  Allyne Gee, MD [facility] Pulmonary Critical Care Medicine Sleep Medicine

## 2018-05-13 ENCOUNTER — Other Ambulatory Visit (HOSPITAL_COMMUNITY): Payer: Self-pay

## 2018-05-13 NOTE — Progress Notes (Signed)
Pulmonary Critical Care Medicine [place]   PULMONARY CRITICAL CARE SERVICE  PROGRESS NOTE  Date of Service: 05/13/2018  Lindsay Mills  [id number]  DOB: 1945/04/26   DOA: 04/09/2018  Referring Physician: Merton Border, MD  HPI: Lindsay Mills is a 73 y.o. female seen for follow up of Acute on Chronic Respiratory Failure.  She has been having a low-grade fever which is being worked up right now her temperature was about 100.0  Medications: Reviewed on Rounds  Physical Exam:  Vitals: Temperature 100.0 pulse 107 respiratory 22 blood pressure 122/68 saturations 100%  Ventilator Settings mode ventilation pressure assist control FiO2 30% inspiratory pressure 15 PEEP 5  . General: Comfortable at this time . Eyes: Grossly normal lids, irises & conjunctiva . ENT: grossly tongue is normal . Neck: no obvious mass . Cardiovascular: S1 S2 normal no gallop . Respiratory: Coarse breath sounds with few rhonchi are noted . Abdomen: soft . Skin: no rash seen on limited exam . Musculoskeletal: not rigid . Psychiatric:unable to assess . Neurologic: no seizure no involuntary movements         Lab Data:   Basic Metabolic Panel: Recent Labs  Lab 05/07/18 0451 05/11/18 0808 05/12/18 0625  NA  --  138 137  K 4.1 3.3* 4.4  CL  --  97* 98  CO2  --  32 33*  GLUCOSE  --  130* 121*  BUN  --  14 13  CREATININE  --  <0.30* <0.30*  CALCIUM  --  8.7* 8.7*  MG  --  1.7 2.2  PHOS  --  3.2  --     ABG: No results for input(s): PHART, PCO2ART, PO2ART, HCO3, O2SAT in the last 168 hours.  Liver Function Tests: No results for input(s): AST, ALT, ALKPHOS, BILITOT, PROT, ALBUMIN in the last 168 hours. No results for input(s): LIPASE, AMYLASE in the last 168 hours. No results for input(s): AMMONIA in the last 168 hours.  CBC: Recent Labs  Lab 05/11/18 0808  WBC 6.2  HGB 8.2*  HCT 26.5*  MCV 89.2  PLT 158    Cardiac Enzymes: No results for input(s):  CKTOTAL, CKMB, CKMBINDEX, TROPONINI in the last 168 hours.  BNP (last 3 results) No results for input(s): BNP in the last 8760 hours.  ProBNP (last 3 results) No results for input(s): PROBNP in the last 8760 hours.  Radiological Exams: No results found.  Assessment/Plan Active Problems:   Disorders of diaphragm   Atrial fibrillation, chronic   Acute on chronic respiratory failure with hypoxia (HCC)   Incomplete quadriplegia at C5-6 level (HCC)   History of stroke   1. Acute on chronic respiratory failure with hypoxia we will continue with full support on the ventilator patient of course is not weaning will continue with full support at this time. 2. Diaphragmatic paralysis supportive care we will continue present therapy 3. Chronic atrial fibrillation rate controlled 4. Incomplete C5-6 quadriplegia at baseline continue therapy as tolerated 5. History of stroke unchanged continue supportive care   I have personally seen and evaluated the patient, evaluated laboratory and imaging results, formulated the assessment and plan and placed orders. The Patient requires high complexity decision making for assessment and support.  Case was discussed on Rounds with the Respiratory Therapy Staff  Allyne Gee, MD [facility] Pulmonary Critical Care Medicine Sleep Medicine

## 2018-05-14 ENCOUNTER — Other Ambulatory Visit (HOSPITAL_COMMUNITY): Payer: Self-pay

## 2018-05-14 DIAGNOSIS — J449 Chronic obstructive pulmonary disease, unspecified: Secondary | ICD-10-CM | POA: Diagnosis not present

## 2018-05-14 NOTE — Progress Notes (Signed)
Pulmonary Critical Care Medicine [facility] [place]   PULMONARY CRITICAL CARE SERVICE  PROGRESS NOTE  Date of Service: 05/14/2018  Lindsay Mills  [id number]  DOB: 01/18/1946   DOA: 04/09/2018  Referring Physician: Carron Curie, MD  HPI: Lindsay Mills is a 73 y.o. female seen for follow up of Acute on Chronic Respiratory Failure.  Patient remains on full support and pressure assist control mode on 30% FiO2 with an inspiratory pressure of 15 she did have a low-grade temperature noted  Medications: Reviewed on Rounds  Physical Exam:  Vitals: Temperature 99.1 pulse 110 respiratory 23 blood pressure 143/80 saturations 100%  Ventilator Settings mode ventilation pressure assist control FiO2 30% inspiratory pressure 15  . General: Comfortable at this time . Eyes: Grossly normal lids, irises & conjunctiva . ENT: grossly tongue is normal . Neck: no obvious mass . Cardiovascular: S1 S2 normal no gallop . Respiratory: Scattered rhonchi expansion are equal at this time . Abdomen: soft . Skin: no rash seen on limited exam . Musculoskeletal: not rigid . Psychiatric:unable to assess . Neurologic: no seizure no involuntary movements         Lab Data:   Basic Metabolic Panel: Recent Labs  Lab 05/11/18 0808 05/12/18 0625  NA 138 137  K 3.3* 4.4  CL 97* 98  CO2 32 33*  GLUCOSE 130* 121*  BUN 14 13  CREATININE <0.30* <0.30*  CALCIUM 8.7* 8.7*  MG 1.7 2.2  PHOS 3.2  --     ABG: No results for input(s): PHART, PCO2ART, PO2ART, HCO3, O2SAT in the last 168 hours.  Liver Function Tests: No results for input(s): AST, ALT, ALKPHOS, BILITOT, PROT, ALBUMIN in the last 168 hours. No results for input(s): LIPASE, AMYLASE in the last 168 hours. No results for input(s): AMMONIA in the last 168 hours.  CBC: Recent Labs  Lab 05/11/18 0808  WBC 6.2  HGB 8.2*  HCT 26.5*  MCV 89.2  PLT 158    Cardiac Enzymes: No results for input(s): CKTOTAL, CKMB,  CKMBINDEX, TROPONINI in the last 168 hours.  BNP (last 3 results) No results for input(s): BNP in the last 8760 hours.  ProBNP (last 3 results) No results for input(s): PROBNP in the last 8760 hours.  Radiological Exams: Dg Chest Port 1 View  Result Date: 05/14/2018 CLINICAL DATA:  Removed fevers EXAM: PORTABLE CHEST 1 VIEW COMPARISON:  05/10/2018 FINDINGS: Cardiac shadow is stable. Postsurgical changes are again seen. Tracheostomy tube is noted in satisfactory position. The lungs are hyperinflated. No focal infiltrate or sizable effusion is noted. Stable elevation of left hemidiaphragm is seen. IMPRESSION: COPD without acute abnormality. Electronically Signed   By: Alcide Clever M.D.   On: 05/14/2018 08:41    Assessment/Plan Active Problems:   Disorders of diaphragm   Atrial fibrillation, chronic   Acute on chronic respiratory failure with hypoxia (HCC)   Incomplete quadriplegia at C5-6 level (HCC)   History of stroke   1. Acute on chronic respiratory failure with hypoxia we will continue with full vent support patient is not able to wean 2. Chronic atrial fibrillation rate controlled 3. Diaphragmatic paralysis at baseline 4. Incomplete quadriplegia continue with supportive care 5. History of stroke at baseline   I have personally seen and evaluated the patient, evaluated laboratory and imaging results, formulated the assessment and plan and placed orders. The Patient requires high complexity decision making for assessment and support.  Case was discussed on Rounds with the Respiratory Therapy Staff  Saadat A  Welton Flakes, MD [facility] Pulmonary Critical Care Medicine Sleep Medicine

## 2018-05-15 LAB — CBC
HEMATOCRIT: 28 % — AB (ref 36.0–46.0)
Hemoglobin: 8.5 g/dL — ABNORMAL LOW (ref 12.0–15.0)
MCH: 27.3 pg (ref 26.0–34.0)
MCHC: 30.4 g/dL (ref 30.0–36.0)
MCV: 90 fL (ref 80.0–100.0)
Platelets: 119 10*3/uL — ABNORMAL LOW (ref 150–400)
RBC: 3.11 MIL/uL — ABNORMAL LOW (ref 3.87–5.11)
RDW: 18.9 % — ABNORMAL HIGH (ref 11.5–15.5)
WBC: 4.1 10*3/uL (ref 4.0–10.5)
nRBC: 0 % (ref 0.0–0.2)

## 2018-05-15 NOTE — Progress Notes (Signed)
Pulmonary Critical Care Medicine [facility] [place]   PULMONARY CRITICAL CARE SERVICE  PROGRESS NOTE  Date of Service: 05/15/2018  Lindsay Mills  [id number]  DOB: [birth year]   DOA: 04/09/2018  Referring Physician: Carron Curie, MD  HPI: Lindsay Mills is a 73 y.o. female seen for follow up of Acute on Chronic Respiratory Failure.  She continues on full vent support on pressure control and 30% FiO2.  She is having some excessive secretions noted.  Medications: Reviewed on Rounds  Physical Exam:  Vitals: Temperature 98.4 pulse 90 respiratory rate 13 blood pressure 128/78 saturations 100%  Ventilator Settings mode ventilation pressure assist control FiO2 30% tidal volume 343 PEEP 5  . General: Comfortable at this time . Eyes: Grossly normal lids, irises & conjunctiva . ENT: grossly tongue is normal . Neck: no obvious mass . Cardiovascular: S1 S2 normal no gallop . Respiratory: Coarse breath sounds with few rhonchi . Abdomen: soft . Skin: no rash seen on limited exam . Musculoskeletal: not rigid . Psychiatric:unable to assess . Neurologic: no seizure no involuntary movements         Lab Data:   Basic Metabolic Panel: Recent Labs  Lab 05/11/18 0808 05/12/18 0625  NA 138 137  K 3.3* 4.4  CL 97* 98  CO2 32 33*  GLUCOSE 130* 121*  BUN 14 13  CREATININE <0.30* <0.30*  CALCIUM 8.7* 8.7*  MG 1.7 2.2  PHOS 3.2  --     ABG: No results for input(s): PHART, PCO2ART, PO2ART, HCO3, O2SAT in the last 168 hours.  Liver Function Tests: No results for input(s): AST, ALT, ALKPHOS, BILITOT, PROT, ALBUMIN in the last 168 hours. No results for input(s): LIPASE, AMYLASE in the last 168 hours. No results for input(s): AMMONIA in the last 168 hours.  CBC: Recent Labs  Lab 05/11/18 0808 05/15/18 0624  WBC 6.2 4.1  HGB 8.2* 8.5*  HCT 26.5* 28.0*  MCV 89.2 90.0  PLT 158 119*    Cardiac Enzymes: No results for input(s): CKTOTAL, CKMB, CKMBINDEX,  TROPONINI in the last 168 hours.  BNP (last 3 results) No results for input(s): BNP in the last 8760 hours.  ProBNP (last 3 results) No results for input(s): PROBNP in the last 8760 hours.  Radiological Exams: Dg Chest Port 1 View  Result Date: 05/14/2018 CLINICAL DATA:  Removed fevers EXAM: PORTABLE CHEST 1 VIEW COMPARISON:  05/10/2018 FINDINGS: Cardiac shadow is stable. Postsurgical changes are again seen. Tracheostomy tube is noted in satisfactory position. The lungs are hyperinflated. No focal infiltrate or sizable effusion is noted. Stable elevation of left hemidiaphragm is seen. IMPRESSION: COPD without acute abnormality. Electronically Signed   By: Alcide Clever M.D.   On: 05/14/2018 08:41    Assessment/Plan Active Problems:   Disorders of diaphragm   Atrial fibrillation, chronic   Acute on chronic respiratory failure with hypoxia (HCC)   Incomplete quadriplegia at C5-6 level (HCC)   History of stroke   1. Acute on chronic respiratory failure with hypoxia we will continue with full vent support.  Patient is not able to do any weaning 2. Diaphragmatic paralysis at baseline continue supportive care 3. Chronic atrial fibrillation rate controlled 4. C5-6 quadriplegia supportive care continue present management 5. History of stroke at baseline   I have personally seen and evaluated the patient, evaluated laboratory and imaging results, formulated the assessment and plan and placed orders. The Patient requires high complexity decision making for assessment and support.  Case was discussed on  Rounds with the Respiratory Therapy Staff  Yevonne Pax, MD [facility] Pulmonary Critical Care Medicine Sleep Medicine

## 2018-05-16 LAB — CULTURE, RESPIRATORY W GRAM STAIN

## 2018-05-16 LAB — CULTURE, RESPIRATORY

## 2018-05-16 NOTE — Progress Notes (Signed)
Pulmonary Critical Care Medicine [place]   PULMONARY CRITICAL CARE SERVICE  PROGRESS NOTE  Date of Service: 05/16/2018  Lindsay Mills  [id number]  DOB: 04-19-1945   DOA: 04/09/2018  Referring Physician: Merton Border, MD  HPI: Lindsay Mills is a 73 y.o. female seen for follow up of Acute on Chronic Respiratory Failure.  Patient is on full support on pressure assist control currently on 30% FiO2 with a PEEP of 5  Medications: Reviewed on Rounds  Physical Exam:  Vitals: Temperature 98.2 pulse 90 respiratory 28 blood pressure 114/72 saturations 99%  Ventilator Settings mode ventilation pressure assist control FiO2 30% tidal volume 320 PEEP 5  . General: Comfortable at this time . Eyes: Grossly normal lids, irises & conjunctiva . ENT: grossly tongue is normal . Neck: no obvious mass . Cardiovascular: S1 S2 normal no gallop . Respiratory: Scattered rhonchi expansion is equal . Abdomen: soft . Skin: no rash seen on limited exam . Musculoskeletal: not rigid . Psychiatric:unable to assess . Neurologic: no seizure no involuntary movements         Lab Data:   Basic Metabolic Panel: Recent Labs  Lab 05/11/18 0808 05/12/18 0625  NA 138 137  K 3.3* 4.4  CL 97* 98  CO2 32 33*  GLUCOSE 130* 121*  BUN 14 13  CREATININE <0.30* <0.30*  CALCIUM 8.7* 8.7*  MG 1.7 2.2  PHOS 3.2  --     ABG: No results for input(s): PHART, PCO2ART, PO2ART, HCO3, O2SAT in the last 168 hours.  Liver Function Tests: No results for input(s): AST, ALT, ALKPHOS, BILITOT, PROT, ALBUMIN in the last 168 hours. No results for input(s): LIPASE, AMYLASE in the last 168 hours. No results for input(s): AMMONIA in the last 168 hours.  CBC: Recent Labs  Lab 05/11/18 0808 05/15/18 0624  WBC 6.2 4.1  HGB 8.2* 8.5*  HCT 26.5* 28.0*  MCV 89.2 90.0  PLT 158 119*    Cardiac Enzymes: No results for input(s): CKTOTAL, CKMB, CKMBINDEX, TROPONINI in the last 168  hours.  BNP (last 3 results) No results for input(s): BNP in the last 8760 hours.  ProBNP (last 3 results) No results for input(s): PROBNP in the last 8760 hours.  Radiological Exams: No results found.  Assessment/Plan Active Problems:   Disorders of diaphragm   Atrial fibrillation, chronic   Acute on chronic respiratory failure with hypoxia (HCC)   Incomplete quadriplegia at C5-6 level (HCC)   History of stroke   1. Acute on chronic respiratory failure with hypoxia we will continue with full support on pressure assist control right now on 30% FiO2 with a PEEP of 5 2. Chronic atrial fibrillation rate is controlled at this time 3. C5-6 quadriplegia restorative therapy 4. History of stroke no change 5. Diaphragmatic paralysis at baseline not able to wean from the vent   I have personally seen and evaluated the patient, evaluated laboratory and imaging results, formulated the assessment and plan and placed orders. The Patient requires high complexity decision making for assessment and support.  Case was discussed on Rounds with the Respiratory Therapy Staff  Allyne Gee, MD [facility] Pulmonary Critical Care Medicine Sleep Medicine

## 2018-05-17 NOTE — Progress Notes (Signed)
Pulmonary Critical Care Medicine [place]   PULMONARY CRITICAL CARE SERVICE  PROGRESS NOTE  Date of Service: 05/17/2018  [name]  [id number]  DOB: [birth year]   DOA: 04/09/2018  Referring Physician: Merton Border, MD  HPI: Lindsay Mills is a 73 y.o. female seen for follow up of Acute on Chronic Respiratory Failure.  Patient is on full support currently on pressure assist control mode has been on 30% FiO2 with good volumes  Medications: Reviewed on Rounds  Physical Exam:  Vitals: Temperature 97.9 pulse 104 respiratory 25 blood pressure 113/69 saturations 100%  Ventilator Settings mode ventilation pressure assist control FiO2 30% tidal volume 337 PEEP 5  . General: Comfortable at this time . Eyes: Grossly normal lids, irises & conjunctiva . ENT: grossly tongue is normal . Neck: no obvious mass . Cardiovascular: S1 S2 normal no gallop . Respiratory: Scattered rhonchi expansion is equal . Abdomen: soft . Skin: no rash seen on limited exam . Musculoskeletal: not rigid . Psychiatric:unable to assess . Neurologic: no seizure no involuntary movements         Lab Data:   Basic Metabolic Panel: Recent Labs  Lab 05/11/18 0808 05/12/18 0625  NA 138 137  K 3.3* 4.4  CL 97* 98  CO2 32 33*  GLUCOSE 130* 121*  BUN 14 13  CREATININE <0.30* <0.30*  CALCIUM 8.7* 8.7*  MG 1.7 2.2  PHOS 3.2  --     ABG: No results for input(s): PHART, PCO2ART, PO2ART, HCO3, O2SAT in the last 168 hours.  Liver Function Tests: No results for input(s): AST, ALT, ALKPHOS, BILITOT, PROT, ALBUMIN in the last 168 hours. No results for input(s): LIPASE, AMYLASE in the last 168 hours. No results for input(s): AMMONIA in the last 168 hours.  CBC: Recent Labs  Lab 05/11/18 0808 05/15/18 0624  WBC 6.2 4.1  HGB 8.2* 8.5*  HCT 26.5* 28.0*  MCV 89.2 90.0  PLT 158 119*    Cardiac Enzymes: No results for input(s): CKTOTAL, CKMB, CKMBINDEX, TROPONINI in the  last 168 hours.  BNP (last 3 results) No results for input(s): BNP in the last 8760 hours.  ProBNP (last 3 results) No results for input(s): PROBNP in the last 8760 hours.  Radiological Exams: No results found.  Assessment/Plan Active Problems:   Disorders of diaphragm   Atrial fibrillation, chronic   Acute on chronic respiratory failure with hypoxia (HCC)   Incomplete quadriplegia at C5-6 level (HCC)   History of stroke   1. Acute on chronic respiratory failure with hypoxia we will continue with full support on pressure assist control continue aggressive pulmonary toilet 2. Chronic atrial fibrillation rate controlled 3. Incomplete quadriplegia C5-6 we will continue supportive care 4. History of stroke at baseline 5. Diaphragmatic paralysis at baseline vent dependent   I have personally seen and evaluated the patient, evaluated laboratory and imaging results, formulated the assessment and plan and placed orders. The Patient requires high complexity decision making for assessment and support.  Case was discussed on Rounds with the Respiratory Therapy Staff  Allyne Gee, MD [facility] Pulmonary Critical Care Medicine Sleep Medicine

## 2018-05-18 NOTE — Progress Notes (Addendum)
Pulmonary Critical Care Medicine [place]   PULMONARY CRITICAL CARE SERVICE  PROGRESS NOTE  Date of Service: 05/18/2018  [name]  [id number]  DOB: [birth year]   DOA: 04/09/2018  Referring Physician: Merton Border, MD  HPI: Lindsay Mills is a 73 y.o. female seen for follow up of Acute on Chronic Respiratory Failure.  Patient remains on full support pressure assist control mode.  FiO2 remains 30% patient is pulling good volumes.  Medications: Reviewed on Rounds  Physical Exam:  Vitals: Pulse 77 respirations 22 BP 124/54 O2 sat 100% temp 97.6  Ventilator Settings ventilator mode AC PC rate 18 IP 15 FiO2 30% PEEP 5  . General: Comfortable at this time . Eyes: Grossly normal lids, irises & conjunctiva . ENT: grossly tongue is normal . Neck: no obvious mass . Cardiovascular: S1 S2 normal no gallop . Respiratory: Scattered rhonchi . Abdomen: soft . Skin: no rash seen on limited exam . Musculoskeletal: not rigid . Psychiatric:unable to assess . Neurologic: no seizure no involuntary movements         Lab Data:   Basic Metabolic Panel: Recent Labs  Lab 05/12/18 0625  NA 137  K 4.4  CL 98  CO2 33*  GLUCOSE 121*  BUN 13  CREATININE <0.30*  CALCIUM 8.7*  MG 2.2    ABG: No results for input(s): PHART, PCO2ART, PO2ART, HCO3, O2SAT in the last 168 hours.  Liver Function Tests: No results for input(s): AST, ALT, ALKPHOS, BILITOT, PROT, ALBUMIN in the last 168 hours. No results for input(s): LIPASE, AMYLASE in the last 168 hours. No results for input(s): AMMONIA in the last 168 hours.  CBC: Recent Labs  Lab 05/15/18 0624  WBC 4.1  HGB 8.5*  HCT 28.0*  MCV 90.0  PLT 119*    Cardiac Enzymes: No results for input(s): CKTOTAL, CKMB, CKMBINDEX, TROPONINI in the last 168 hours.  BNP (last 3 results) No results for input(s): BNP in the last 8760 hours.  ProBNP (last 3 results) No results for input(s): PROBNP in the last 8760  hours.  Radiological Exams: No results found.  Assessment/Plan Active Problems:   Disorders of diaphragm   Atrial fibrillation, chronic   Acute on chronic respiratory failure with hypoxia (HCC)   Incomplete quadriplegia at C5-6 level (HCC)   History of stroke   1. Acute on chronic respiratory failure with hypoxia continue full support on pressure assist control mode.  Continue aggressive pulmonary toilet and supportive measures. 2. Chronic atrial fibrillation rate trolled 3. Incomplete quadriplegia C5-6 we will continue supportive care 4. History of stroke at baseline 5. Diaphragmatic paralysis at baseline vent dependent   I have personally seen and evaluated the patient, evaluated laboratory and imaging results, formulated the assessment and plan and placed orders. The Patient requires high complexity decision making for assessment and support.  Case was discussed on Rounds with the Respiratory Therapy Staff  Allyne Gee, MD [facility] Pulmonary Critical Care Medicine Sleep Medicine

## 2018-05-19 LAB — CULTURE, BLOOD (ROUTINE X 2)
Culture: NO GROWTH
Culture: NO GROWTH

## 2018-05-19 NOTE — Progress Notes (Addendum)
Pulmonary Critical Care Medicine [place]   PULMONARY CRITICAL CARE SERVICE  PROGRESS NOTE  Date of Service: 05/19/2018  Lindsay Mills  [id number]  DOB: 01-04-1946   DOA: 04/09/2018  Referring Physician: Merton Border, MD  HPI: Lindsay Mills is a 73 y.o. female seen for follow up of Acute on Chronic Respiratory Failure.  Patient remains on full support pressure assist control mode.  FiO2 continues to be 30% patient is pulling good volumes and tolerating well at this time.  Minimal secretions noted.  Medications: Reviewed on Rounds  Physical Exam:  Vitals: Pulse 90 respirations 18 BP 112/52 O2 sat 97% temp 98.1  Ventilator Settings ventilator mode AC PC rate of 18 IP 15 PEEP of 5 FiO2 30%  . General: Comfortable at this time . Eyes: Grossly normal lids, irises & conjunctiva . ENT: grossly tongue is normal . Neck: no obvious mass . Cardiovascular: S1 S2 normal no gallop . Respiratory: Scattered rhonchi . Abdomen: soft . Skin: no rash seen on limited exam . Musculoskeletal: not rigid . Psychiatric:unable to assess . Neurologic: no seizure no involuntary movements         Lab Data:   Basic Metabolic Panel: No results for input(s): NA, K, CL, CO2, GLUCOSE, BUN, CREATININE, CALCIUM, MG, PHOS in the last 168 hours.  ABG: No results for input(s): PHART, PCO2ART, PO2ART, HCO3, O2SAT in the last 168 hours.  Liver Function Tests: No results for input(s): AST, ALT, ALKPHOS, BILITOT, PROT, ALBUMIN in the last 168 hours. No results for input(s): LIPASE, AMYLASE in the last 168 hours. No results for input(s): AMMONIA in the last 168 hours.  CBC: Recent Labs  Lab 05/15/18 0624  WBC 4.1  HGB 8.5*  HCT 28.0*  MCV 90.0  PLT 119*    Cardiac Enzymes: No results for input(s): CKTOTAL, CKMB, CKMBINDEX, TROPONINI in the last 168 hours.  BNP (last 3 results) No results for input(s): BNP in the last 8760 hours.  ProBNP (last 3 results) No  results for input(s): PROBNP in the last 8760 hours.  Radiological Exams: No results found.  Assessment/Plan Active Problems:   Disorders of diaphragm   Atrial fibrillation, chronic   Acute on chronic respiratory failure with hypoxia (HCC)   Incomplete quadriplegia at C5-6 level (HCC)   History of stroke   1. Acute on chronic respiratory failure with hypoxia continue full support on pressure assist control mode continue aggressive pulmonary toilet supportive measures. 2. Chronic atrial fibrillation rate controlled 3. Incomplete quadriplegia C5-6 we will continue supportive care 4. History of stroke at baseline 5. Diaphragmatic paralysis at baseline vent dependent   I have personally seen and evaluated the patient, evaluated laboratory and imaging results, formulated the assessment and plan and placed orders. The Patient requires high complexity decision making for assessment and support.  Case was discussed on Rounds with the Respiratory Therapy Staff  Allyne Gee, MD [facility] Pulmonary Critical Care Medicine Sleep Medicine

## 2018-05-21 DIAGNOSIS — Z8669 Personal history of other diseases of the nervous system and sense organs: Secondary | ICD-10-CM | POA: Diagnosis not present

## 2018-05-21 DIAGNOSIS — Z9981 Dependence on supplemental oxygen: Secondary | ICD-10-CM | POA: Diagnosis not present

## 2018-05-21 DIAGNOSIS — J986 Disorders of diaphragm: Secondary | ICD-10-CM | POA: Diagnosis not present

## 2018-05-21 DIAGNOSIS — Z4682 Encounter for fitting and adjustment of non-vascular catheter: Secondary | ICD-10-CM | POA: Diagnosis not present

## 2018-05-21 DIAGNOSIS — Z888 Allergy status to other drugs, medicaments and biological substances status: Secondary | ICD-10-CM | POA: Diagnosis not present

## 2018-05-21 DIAGNOSIS — Z930 Tracheostomy status: Secondary | ICD-10-CM | POA: Diagnosis not present

## 2018-05-21 DIAGNOSIS — Z8673 Personal history of transient ischemic attack (TIA), and cerebral infarction without residual deficits: Secondary | ICD-10-CM | POA: Diagnosis not present

## 2018-05-21 DIAGNOSIS — Z885 Allergy status to narcotic agent status: Secondary | ICD-10-CM | POA: Diagnosis not present

## 2018-05-21 DIAGNOSIS — I4891 Unspecified atrial fibrillation: Secondary | ICD-10-CM | POA: Diagnosis not present

## 2018-05-21 DIAGNOSIS — K9420 Gastrostomy complication, unspecified: Secondary | ICD-10-CM | POA: Diagnosis not present

## 2018-05-21 DIAGNOSIS — Z9911 Dependence on respirator [ventilator] status: Secondary | ICD-10-CM | POA: Diagnosis not present

## 2018-05-21 DIAGNOSIS — K9423 Gastrostomy malfunction: Secondary | ICD-10-CM | POA: Diagnosis not present

## 2018-05-21 DIAGNOSIS — R279 Unspecified lack of coordination: Secondary | ICD-10-CM | POA: Diagnosis not present

## 2018-05-21 DIAGNOSIS — R5381 Other malaise: Secondary | ICD-10-CM | POA: Diagnosis not present

## 2018-05-21 DIAGNOSIS — Z743 Need for continuous supervision: Secondary | ICD-10-CM | POA: Diagnosis not present

## 2018-05-21 DIAGNOSIS — J9621 Acute and chronic respiratory failure with hypoxia: Secondary | ICD-10-CM | POA: Diagnosis not present

## 2018-05-21 DIAGNOSIS — Z7951 Long term (current) use of inhaled steroids: Secondary | ICD-10-CM | POA: Diagnosis not present

## 2018-05-21 DIAGNOSIS — Z87891 Personal history of nicotine dependence: Secondary | ICD-10-CM | POA: Diagnosis not present

## 2018-05-21 DIAGNOSIS — G8254 Quadriplegia, C5-C7 incomplete: Secondary | ICD-10-CM | POA: Diagnosis not present

## 2018-05-21 DIAGNOSIS — Z95818 Presence of other cardiac implants and grafts: Secondary | ICD-10-CM | POA: Diagnosis not present

## 2018-05-21 DIAGNOSIS — I10 Essential (primary) hypertension: Secondary | ICD-10-CM | POA: Diagnosis not present

## 2018-05-21 DIAGNOSIS — J9622 Acute and chronic respiratory failure with hypercapnia: Secondary | ICD-10-CM | POA: Diagnosis not present

## 2018-05-22 DIAGNOSIS — Z930 Tracheostomy status: Secondary | ICD-10-CM | POA: Diagnosis not present

## 2018-05-22 DIAGNOSIS — R350 Frequency of micturition: Secondary | ICD-10-CM | POA: Diagnosis not present

## 2018-05-22 DIAGNOSIS — Z7901 Long term (current) use of anticoagulants: Secondary | ICD-10-CM | POA: Diagnosis not present

## 2018-05-22 DIAGNOSIS — I509 Heart failure, unspecified: Secondary | ICD-10-CM | POA: Diagnosis not present

## 2018-05-22 DIAGNOSIS — J449 Chronic obstructive pulmonary disease, unspecified: Secondary | ICD-10-CM | POA: Diagnosis not present

## 2018-05-22 DIAGNOSIS — L89324 Pressure ulcer of left buttock, stage 4: Secondary | ICD-10-CM | POA: Diagnosis not present

## 2018-05-23 DIAGNOSIS — J986 Disorders of diaphragm: Secondary | ICD-10-CM | POA: Diagnosis not present

## 2018-05-23 DIAGNOSIS — I10 Essential (primary) hypertension: Secondary | ICD-10-CM | POA: Diagnosis not present

## 2018-05-23 DIAGNOSIS — G8254 Quadriplegia, C5-C7 incomplete: Secondary | ICD-10-CM | POA: Diagnosis not present

## 2018-05-23 DIAGNOSIS — Z9911 Dependence on respirator [ventilator] status: Secondary | ICD-10-CM | POA: Diagnosis not present

## 2018-05-23 DIAGNOSIS — I4891 Unspecified atrial fibrillation: Secondary | ICD-10-CM | POA: Diagnosis not present

## 2018-05-23 DIAGNOSIS — J9621 Acute and chronic respiratory failure with hypoxia: Secondary | ICD-10-CM | POA: Diagnosis not present

## 2018-05-23 DIAGNOSIS — J9622 Acute and chronic respiratory failure with hypercapnia: Secondary | ICD-10-CM | POA: Diagnosis not present

## 2018-05-23 DIAGNOSIS — Z930 Tracheostomy status: Secondary | ICD-10-CM | POA: Diagnosis not present

## 2018-05-24 DIAGNOSIS — J9622 Acute and chronic respiratory failure with hypercapnia: Secondary | ICD-10-CM | POA: Diagnosis not present

## 2018-05-24 DIAGNOSIS — Z9911 Dependence on respirator [ventilator] status: Secondary | ICD-10-CM | POA: Diagnosis not present

## 2018-05-24 DIAGNOSIS — I4891 Unspecified atrial fibrillation: Secondary | ICD-10-CM | POA: Diagnosis not present

## 2018-05-24 DIAGNOSIS — Z930 Tracheostomy status: Secondary | ICD-10-CM | POA: Diagnosis not present

## 2018-05-24 DIAGNOSIS — G8254 Quadriplegia, C5-C7 incomplete: Secondary | ICD-10-CM | POA: Diagnosis not present

## 2018-05-24 DIAGNOSIS — J986 Disorders of diaphragm: Secondary | ICD-10-CM | POA: Diagnosis not present

## 2018-05-24 DIAGNOSIS — I10 Essential (primary) hypertension: Secondary | ICD-10-CM | POA: Diagnosis not present

## 2018-05-24 DIAGNOSIS — J9621 Acute and chronic respiratory failure with hypoxia: Secondary | ICD-10-CM | POA: Diagnosis not present

## 2018-05-27 DIAGNOSIS — Z930 Tracheostomy status: Secondary | ICD-10-CM | POA: Diagnosis not present

## 2018-05-27 DIAGNOSIS — Z9911 Dependence on respirator [ventilator] status: Secondary | ICD-10-CM | POA: Diagnosis not present

## 2018-05-27 DIAGNOSIS — G8254 Quadriplegia, C5-C7 incomplete: Secondary | ICD-10-CM | POA: Diagnosis not present

## 2018-05-27 DIAGNOSIS — J9622 Acute and chronic respiratory failure with hypercapnia: Secondary | ICD-10-CM | POA: Diagnosis not present

## 2018-05-27 DIAGNOSIS — I10 Essential (primary) hypertension: Secondary | ICD-10-CM | POA: Diagnosis not present

## 2018-05-27 DIAGNOSIS — J986 Disorders of diaphragm: Secondary | ICD-10-CM | POA: Diagnosis not present

## 2018-05-27 DIAGNOSIS — I4891 Unspecified atrial fibrillation: Secondary | ICD-10-CM | POA: Diagnosis not present

## 2018-05-27 DIAGNOSIS — J9621 Acute and chronic respiratory failure with hypoxia: Secondary | ICD-10-CM | POA: Diagnosis not present

## 2018-05-28 DIAGNOSIS — Z4509 Encounter for adjustment and management of other cardiac device: Secondary | ICD-10-CM | POA: Diagnosis not present

## 2018-05-30 DIAGNOSIS — Z930 Tracheostomy status: Secondary | ICD-10-CM | POA: Diagnosis not present

## 2018-05-30 DIAGNOSIS — J986 Disorders of diaphragm: Secondary | ICD-10-CM | POA: Diagnosis not present

## 2018-05-30 DIAGNOSIS — J9622 Acute and chronic respiratory failure with hypercapnia: Secondary | ICD-10-CM | POA: Diagnosis not present

## 2018-05-30 DIAGNOSIS — G8254 Quadriplegia, C5-C7 incomplete: Secondary | ICD-10-CM | POA: Diagnosis not present

## 2018-05-30 DIAGNOSIS — Z9911 Dependence on respirator [ventilator] status: Secondary | ICD-10-CM | POA: Diagnosis not present

## 2018-05-30 DIAGNOSIS — I4891 Unspecified atrial fibrillation: Secondary | ICD-10-CM | POA: Diagnosis not present

## 2018-05-30 DIAGNOSIS — I10 Essential (primary) hypertension: Secondary | ICD-10-CM | POA: Diagnosis not present

## 2018-05-30 DIAGNOSIS — J9621 Acute and chronic respiratory failure with hypoxia: Secondary | ICD-10-CM | POA: Diagnosis not present

## 2018-06-03 DIAGNOSIS — J986 Disorders of diaphragm: Secondary | ICD-10-CM | POA: Diagnosis not present

## 2018-06-03 DIAGNOSIS — Z930 Tracheostomy status: Secondary | ICD-10-CM | POA: Diagnosis not present

## 2018-06-03 DIAGNOSIS — J9622 Acute and chronic respiratory failure with hypercapnia: Secondary | ICD-10-CM | POA: Diagnosis not present

## 2018-06-03 DIAGNOSIS — I10 Essential (primary) hypertension: Secondary | ICD-10-CM | POA: Diagnosis not present

## 2018-06-03 DIAGNOSIS — J9621 Acute and chronic respiratory failure with hypoxia: Secondary | ICD-10-CM | POA: Diagnosis not present

## 2018-06-03 DIAGNOSIS — Z9911 Dependence on respirator [ventilator] status: Secondary | ICD-10-CM | POA: Diagnosis not present

## 2018-06-03 DIAGNOSIS — I4891 Unspecified atrial fibrillation: Secondary | ICD-10-CM | POA: Diagnosis not present

## 2018-06-03 DIAGNOSIS — G8254 Quadriplegia, C5-C7 incomplete: Secondary | ICD-10-CM | POA: Diagnosis not present

## 2018-06-05 DIAGNOSIS — S12100A Unspecified displaced fracture of second cervical vertebra, initial encounter for closed fracture: Secondary | ICD-10-CM | POA: Diagnosis not present

## 2018-06-05 DIAGNOSIS — Z930 Tracheostomy status: Secondary | ICD-10-CM | POA: Diagnosis not present

## 2018-06-05 DIAGNOSIS — I639 Cerebral infarction, unspecified: Secondary | ICD-10-CM | POA: Diagnosis not present

## 2018-06-05 DIAGNOSIS — I951 Orthostatic hypotension: Secondary | ICD-10-CM | POA: Diagnosis not present

## 2018-06-05 DIAGNOSIS — I5023 Acute on chronic systolic (congestive) heart failure: Secondary | ICD-10-CM | POA: Diagnosis not present

## 2018-06-05 DIAGNOSIS — J9621 Acute and chronic respiratory failure with hypoxia: Secondary | ICD-10-CM | POA: Diagnosis not present

## 2018-06-05 DIAGNOSIS — S82892A Other fracture of left lower leg, initial encounter for closed fracture: Secondary | ICD-10-CM | POA: Diagnosis not present

## 2018-06-05 DIAGNOSIS — I509 Heart failure, unspecified: Secondary | ICD-10-CM | POA: Diagnosis not present

## 2018-06-08 DIAGNOSIS — N390 Urinary tract infection, site not specified: Secondary | ICD-10-CM | POA: Diagnosis not present

## 2018-06-08 DIAGNOSIS — R7881 Bacteremia: Secondary | ICD-10-CM | POA: Diagnosis not present

## 2018-06-08 DIAGNOSIS — I5023 Acute on chronic systolic (congestive) heart failure: Secondary | ICD-10-CM | POA: Diagnosis not present

## 2018-06-08 DIAGNOSIS — J151 Pneumonia due to Pseudomonas: Secondary | ICD-10-CM | POA: Diagnosis not present

## 2018-06-08 DIAGNOSIS — Z930 Tracheostomy status: Secondary | ICD-10-CM | POA: Diagnosis not present

## 2018-06-08 DIAGNOSIS — I509 Heart failure, unspecified: Secondary | ICD-10-CM | POA: Diagnosis not present

## 2018-06-08 DIAGNOSIS — L89159 Pressure ulcer of sacral region, unspecified stage: Secondary | ICD-10-CM | POA: Diagnosis not present

## 2018-06-08 DIAGNOSIS — S82892A Other fracture of left lower leg, initial encounter for closed fracture: Secondary | ICD-10-CM | POA: Diagnosis not present

## 2018-06-08 DIAGNOSIS — I639 Cerebral infarction, unspecified: Secondary | ICD-10-CM | POA: Diagnosis not present

## 2018-06-11 DIAGNOSIS — E860 Dehydration: Secondary | ICD-10-CM | POA: Diagnosis not present

## 2018-06-11 DIAGNOSIS — B965 Pseudomonas (aeruginosa) (mallei) (pseudomallei) as the cause of diseases classified elsewhere: Secondary | ICD-10-CM | POA: Diagnosis not present

## 2018-06-11 DIAGNOSIS — G9340 Encephalopathy, unspecified: Secondary | ICD-10-CM | POA: Diagnosis not present

## 2018-06-11 DIAGNOSIS — R279 Unspecified lack of coordination: Secondary | ICD-10-CM | POA: Diagnosis not present

## 2018-06-11 DIAGNOSIS — G8254 Quadriplegia, C5-C7 incomplete: Secondary | ICD-10-CM | POA: Diagnosis not present

## 2018-06-11 DIAGNOSIS — R509 Fever, unspecified: Secondary | ICD-10-CM | POA: Diagnosis not present

## 2018-06-11 DIAGNOSIS — R0902 Hypoxemia: Secondary | ICD-10-CM | POA: Diagnosis not present

## 2018-06-11 DIAGNOSIS — Z9911 Dependence on respirator [ventilator] status: Secondary | ICD-10-CM | POA: Diagnosis not present

## 2018-06-11 DIAGNOSIS — I639 Cerebral infarction, unspecified: Secondary | ICD-10-CM | POA: Diagnosis not present

## 2018-06-11 DIAGNOSIS — J9621 Acute and chronic respiratory failure with hypoxia: Secondary | ICD-10-CM | POA: Diagnosis not present

## 2018-06-11 DIAGNOSIS — Z931 Gastrostomy status: Secondary | ICD-10-CM | POA: Diagnosis not present

## 2018-06-11 DIAGNOSIS — L89153 Pressure ulcer of sacral region, stage 3: Secondary | ICD-10-CM | POA: Diagnosis not present

## 2018-06-11 DIAGNOSIS — L03311 Cellulitis of abdominal wall: Secondary | ICD-10-CM | POA: Diagnosis not present

## 2018-06-11 DIAGNOSIS — J189 Pneumonia, unspecified organism: Secondary | ICD-10-CM | POA: Diagnosis not present

## 2018-06-11 DIAGNOSIS — F329 Major depressive disorder, single episode, unspecified: Secondary | ICD-10-CM | POA: Diagnosis not present

## 2018-06-11 DIAGNOSIS — J988 Other specified respiratory disorders: Secondary | ICD-10-CM | POA: Diagnosis not present

## 2018-06-11 DIAGNOSIS — Z930 Tracheostomy status: Secondary | ICD-10-CM | POA: Diagnosis not present

## 2018-06-11 DIAGNOSIS — L89223 Pressure ulcer of left hip, stage 3: Secondary | ICD-10-CM | POA: Diagnosis not present

## 2018-06-11 DIAGNOSIS — G9341 Metabolic encephalopathy: Secondary | ICD-10-CM | POA: Diagnosis not present

## 2018-06-11 DIAGNOSIS — I959 Hypotension, unspecified: Secondary | ICD-10-CM | POA: Diagnosis not present

## 2018-06-11 DIAGNOSIS — R404 Transient alteration of awareness: Secondary | ICD-10-CM | POA: Diagnosis not present

## 2018-06-11 DIAGNOSIS — J95851 Ventilator associated pneumonia: Secondary | ICD-10-CM | POA: Diagnosis not present

## 2018-06-11 DIAGNOSIS — R7989 Other specified abnormal findings of blood chemistry: Secondary | ICD-10-CM | POA: Diagnosis not present

## 2018-06-11 DIAGNOSIS — J449 Chronic obstructive pulmonary disease, unspecified: Secondary | ICD-10-CM | POA: Diagnosis not present

## 2018-06-11 DIAGNOSIS — R5381 Other malaise: Secondary | ICD-10-CM | POA: Diagnosis not present

## 2018-06-11 DIAGNOSIS — J151 Pneumonia due to Pseudomonas: Secondary | ICD-10-CM | POA: Diagnosis not present

## 2018-06-11 DIAGNOSIS — R4020 Unspecified coma: Secondary | ICD-10-CM | POA: Diagnosis not present

## 2018-06-11 DIAGNOSIS — Z4682 Encounter for fitting and adjustment of non-vascular catheter: Secondary | ICD-10-CM | POA: Diagnosis not present

## 2018-06-11 DIAGNOSIS — G8253 Quadriplegia, C5-C7 complete: Secondary | ICD-10-CM | POA: Diagnosis not present

## 2018-06-11 DIAGNOSIS — R7881 Bacteremia: Secondary | ICD-10-CM | POA: Diagnosis not present

## 2018-06-11 DIAGNOSIS — L89159 Pressure ulcer of sacral region, unspecified stage: Secondary | ICD-10-CM | POA: Diagnosis not present

## 2018-06-11 DIAGNOSIS — R197 Diarrhea, unspecified: Secondary | ICD-10-CM | POA: Diagnosis not present

## 2018-06-11 DIAGNOSIS — Z452 Encounter for adjustment and management of vascular access device: Secondary | ICD-10-CM | POA: Diagnosis not present

## 2018-06-11 DIAGNOSIS — T85638A Leakage of other specified internal prosthetic devices, implants and grafts, initial encounter: Secondary | ICD-10-CM | POA: Diagnosis not present

## 2018-06-11 DIAGNOSIS — R918 Other nonspecific abnormal finding of lung field: Secondary | ICD-10-CM | POA: Diagnosis not present

## 2018-06-11 DIAGNOSIS — R4182 Altered mental status, unspecified: Secondary | ICD-10-CM | POA: Diagnosis not present

## 2018-06-11 DIAGNOSIS — N3000 Acute cystitis without hematuria: Secondary | ICD-10-CM | POA: Diagnosis not present

## 2018-06-11 DIAGNOSIS — N281 Cyst of kidney, acquired: Secondary | ICD-10-CM | POA: Diagnosis not present

## 2018-06-11 DIAGNOSIS — N390 Urinary tract infection, site not specified: Secondary | ICD-10-CM | POA: Diagnosis not present

## 2018-06-11 DIAGNOSIS — Z743 Need for continuous supervision: Secondary | ICD-10-CM | POA: Diagnosis not present

## 2018-06-11 DIAGNOSIS — I5023 Acute on chronic systolic (congestive) heart failure: Secondary | ICD-10-CM | POA: Diagnosis not present

## 2018-06-11 DIAGNOSIS — E039 Hypothyroidism, unspecified: Secondary | ICD-10-CM | POA: Diagnosis not present

## 2018-06-11 DIAGNOSIS — J9611 Chronic respiratory failure with hypoxia: Secondary | ICD-10-CM | POA: Diagnosis not present

## 2018-06-12 DIAGNOSIS — I639 Cerebral infarction, unspecified: Secondary | ICD-10-CM | POA: Diagnosis not present

## 2018-06-12 DIAGNOSIS — I5023 Acute on chronic systolic (congestive) heart failure: Secondary | ICD-10-CM | POA: Diagnosis not present

## 2018-06-12 DIAGNOSIS — L89159 Pressure ulcer of sacral region, unspecified stage: Secondary | ICD-10-CM | POA: Diagnosis not present

## 2018-06-12 DIAGNOSIS — Z930 Tracheostomy status: Secondary | ICD-10-CM | POA: Diagnosis not present

## 2018-07-02 DIAGNOSIS — R7881 Bacteremia: Secondary | ICD-10-CM | POA: Diagnosis not present

## 2018-07-03 DIAGNOSIS — R7881 Bacteremia: Secondary | ICD-10-CM | POA: Diagnosis not present

## 2018-07-04 DIAGNOSIS — R7881 Bacteremia: Secondary | ICD-10-CM | POA: Diagnosis not present

## 2018-07-05 DIAGNOSIS — R7881 Bacteremia: Secondary | ICD-10-CM | POA: Diagnosis not present

## 2018-07-06 DIAGNOSIS — Z9981 Dependence on supplemental oxygen: Secondary | ICD-10-CM | POA: Diagnosis not present

## 2018-07-06 DIAGNOSIS — Z8673 Personal history of transient ischemic attack (TIA), and cerebral infarction without residual deficits: Secondary | ICD-10-CM | POA: Diagnosis not present

## 2018-07-06 DIAGNOSIS — Z885 Allergy status to narcotic agent status: Secondary | ICD-10-CM | POA: Diagnosis not present

## 2018-07-06 DIAGNOSIS — R279 Unspecified lack of coordination: Secondary | ICD-10-CM | POA: Diagnosis not present

## 2018-07-06 DIAGNOSIS — Z743 Need for continuous supervision: Secondary | ICD-10-CM | POA: Diagnosis not present

## 2018-07-06 DIAGNOSIS — E039 Hypothyroidism, unspecified: Secondary | ICD-10-CM | POA: Diagnosis not present

## 2018-07-06 DIAGNOSIS — M1990 Unspecified osteoarthritis, unspecified site: Secondary | ICD-10-CM | POA: Diagnosis not present

## 2018-07-06 DIAGNOSIS — K9423 Gastrostomy malfunction: Secondary | ICD-10-CM | POA: Diagnosis not present

## 2018-07-06 DIAGNOSIS — R29898 Other symptoms and signs involving the musculoskeletal system: Secondary | ICD-10-CM | POA: Diagnosis not present

## 2018-07-06 DIAGNOSIS — I4891 Unspecified atrial fibrillation: Secondary | ICD-10-CM | POA: Diagnosis not present

## 2018-07-06 DIAGNOSIS — R001 Bradycardia, unspecified: Secondary | ICD-10-CM | POA: Diagnosis not present

## 2018-07-06 DIAGNOSIS — J45909 Unspecified asthma, uncomplicated: Secondary | ICD-10-CM | POA: Diagnosis not present

## 2018-07-06 DIAGNOSIS — Z87891 Personal history of nicotine dependence: Secondary | ICD-10-CM | POA: Diagnosis not present

## 2018-07-08 DIAGNOSIS — Z9911 Dependence on respirator [ventilator] status: Secondary | ICD-10-CM | POA: Diagnosis not present

## 2018-07-08 DIAGNOSIS — Z930 Tracheostomy status: Secondary | ICD-10-CM | POA: Diagnosis not present

## 2018-07-08 DIAGNOSIS — Z760 Encounter for issue of repeat prescription: Secondary | ICD-10-CM | POA: Diagnosis not present

## 2018-07-08 DIAGNOSIS — I5023 Acute on chronic systolic (congestive) heart failure: Secondary | ICD-10-CM | POA: Diagnosis not present

## 2018-07-08 DIAGNOSIS — L89159 Pressure ulcer of sacral region, unspecified stage: Secondary | ICD-10-CM | POA: Diagnosis not present

## 2018-07-08 DIAGNOSIS — G894 Chronic pain syndrome: Secondary | ICD-10-CM | POA: Diagnosis not present

## 2018-07-08 DIAGNOSIS — I639 Cerebral infarction, unspecified: Secondary | ICD-10-CM | POA: Diagnosis not present

## 2018-07-09 DIAGNOSIS — J151 Pneumonia due to Pseudomonas: Secondary | ICD-10-CM | POA: Diagnosis not present

## 2018-07-09 DIAGNOSIS — R58 Hemorrhage, not elsewhere classified: Secondary | ICD-10-CM | POA: Diagnosis not present

## 2018-07-09 DIAGNOSIS — J45909 Unspecified asthma, uncomplicated: Secondary | ICD-10-CM | POA: Diagnosis not present

## 2018-07-09 DIAGNOSIS — S12401S Unspecified nondisplaced fracture of fifth cervical vertebra, sequela: Secondary | ICD-10-CM | POA: Diagnosis not present

## 2018-07-09 DIAGNOSIS — K9421 Gastrostomy hemorrhage: Secondary | ICD-10-CM | POA: Diagnosis not present

## 2018-07-09 DIAGNOSIS — M1990 Unspecified osteoarthritis, unspecified site: Secondary | ICD-10-CM | POA: Diagnosis not present

## 2018-07-09 DIAGNOSIS — Z87891 Personal history of nicotine dependence: Secondary | ICD-10-CM | POA: Diagnosis not present

## 2018-07-09 DIAGNOSIS — I4892 Unspecified atrial flutter: Secondary | ICD-10-CM | POA: Diagnosis not present

## 2018-07-09 DIAGNOSIS — Z7901 Long term (current) use of anticoagulants: Secondary | ICD-10-CM | POA: Diagnosis not present

## 2018-07-09 DIAGNOSIS — I959 Hypotension, unspecified: Secondary | ICD-10-CM | POA: Diagnosis not present

## 2018-07-09 DIAGNOSIS — K9423 Gastrostomy malfunction: Secondary | ICD-10-CM | POA: Diagnosis not present

## 2018-07-09 DIAGNOSIS — Z8673 Personal history of transient ischemic attack (TIA), and cerebral infarction without residual deficits: Secondary | ICD-10-CM | POA: Diagnosis not present

## 2018-07-09 DIAGNOSIS — I480 Paroxysmal atrial fibrillation: Secondary | ICD-10-CM | POA: Diagnosis not present

## 2018-07-09 DIAGNOSIS — N390 Urinary tract infection, site not specified: Secondary | ICD-10-CM | POA: Diagnosis not present

## 2018-07-09 DIAGNOSIS — G8254 Quadriplegia, C5-C7 incomplete: Secondary | ICD-10-CM | POA: Diagnosis not present

## 2018-07-09 DIAGNOSIS — R5381 Other malaise: Secondary | ICD-10-CM | POA: Diagnosis not present

## 2018-07-09 DIAGNOSIS — L89154 Pressure ulcer of sacral region, stage 4: Secondary | ICD-10-CM | POA: Diagnosis not present

## 2018-07-09 DIAGNOSIS — I509 Heart failure, unspecified: Secondary | ICD-10-CM | POA: Diagnosis not present

## 2018-07-09 DIAGNOSIS — L89159 Pressure ulcer of sacral region, unspecified stage: Secondary | ICD-10-CM | POA: Diagnosis not present

## 2018-07-09 DIAGNOSIS — R279 Unspecified lack of coordination: Secondary | ICD-10-CM | POA: Diagnosis not present

## 2018-07-09 DIAGNOSIS — R001 Bradycardia, unspecified: Secondary | ICD-10-CM | POA: Diagnosis not present

## 2018-07-09 DIAGNOSIS — Z743 Need for continuous supervision: Secondary | ICD-10-CM | POA: Diagnosis not present

## 2018-07-09 DIAGNOSIS — E039 Hypothyroidism, unspecified: Secondary | ICD-10-CM | POA: Diagnosis not present

## 2018-07-12 DIAGNOSIS — Z7901 Long term (current) use of anticoagulants: Secondary | ICD-10-CM | POA: Diagnosis not present

## 2018-07-12 DIAGNOSIS — Z930 Tracheostomy status: Secondary | ICD-10-CM | POA: Diagnosis not present

## 2018-07-12 DIAGNOSIS — L89159 Pressure ulcer of sacral region, unspecified stage: Secondary | ICD-10-CM | POA: Diagnosis not present

## 2018-07-12 DIAGNOSIS — L8990 Pressure ulcer of unspecified site, unspecified stage: Secondary | ICD-10-CM | POA: Diagnosis not present

## 2018-07-12 DIAGNOSIS — I639 Cerebral infarction, unspecified: Secondary | ICD-10-CM | POA: Diagnosis not present

## 2018-07-12 DIAGNOSIS — I5023 Acute on chronic systolic (congestive) heart failure: Secondary | ICD-10-CM | POA: Diagnosis not present

## 2018-07-12 DIAGNOSIS — Z7689 Persons encountering health services in other specified circumstances: Secondary | ICD-10-CM | POA: Diagnosis not present

## 2018-07-12 DIAGNOSIS — J9501 Hemorrhage from tracheostomy stoma: Secondary | ICD-10-CM | POA: Diagnosis not present

## 2018-07-15 DIAGNOSIS — Z515 Encounter for palliative care: Secondary | ICD-10-CM | POA: Diagnosis not present

## 2018-07-15 DIAGNOSIS — R069 Unspecified abnormalities of breathing: Secondary | ICD-10-CM | POA: Diagnosis not present

## 2018-07-15 DIAGNOSIS — R404 Transient alteration of awareness: Secondary | ICD-10-CM | POA: Diagnosis not present

## 2018-07-15 DIAGNOSIS — R000 Tachycardia, unspecified: Secondary | ICD-10-CM | POA: Diagnosis not present

## 2018-07-15 DIAGNOSIS — J9601 Acute respiratory failure with hypoxia: Secondary | ICD-10-CM | POA: Diagnosis not present

## 2018-07-15 DIAGNOSIS — G8253 Quadriplegia, C5-C7 complete: Secondary | ICD-10-CM | POA: Diagnosis not present

## 2018-07-15 DIAGNOSIS — I4891 Unspecified atrial fibrillation: Secondary | ICD-10-CM | POA: Diagnosis not present

## 2018-07-15 DIAGNOSIS — E43 Unspecified severe protein-calorie malnutrition: Secondary | ICD-10-CM | POA: Diagnosis not present

## 2018-07-15 DIAGNOSIS — J9611 Chronic respiratory failure with hypoxia: Secondary | ICD-10-CM | POA: Diagnosis not present

## 2018-07-15 DIAGNOSIS — E039 Hypothyroidism, unspecified: Secondary | ICD-10-CM | POA: Diagnosis not present

## 2018-07-15 DIAGNOSIS — R64 Cachexia: Secondary | ICD-10-CM | POA: Diagnosis not present

## 2018-07-15 DIAGNOSIS — R918 Other nonspecific abnormal finding of lung field: Secondary | ICD-10-CM | POA: Diagnosis not present

## 2018-07-15 DIAGNOSIS — J189 Pneumonia, unspecified organism: Secondary | ICD-10-CM | POA: Diagnosis not present

## 2018-07-15 DIAGNOSIS — G8929 Other chronic pain: Secondary | ICD-10-CM | POA: Diagnosis not present

## 2018-07-15 DIAGNOSIS — Z9911 Dependence on respirator [ventilator] status: Secondary | ICD-10-CM | POA: Diagnosis not present

## 2018-07-15 DIAGNOSIS — I10 Essential (primary) hypertension: Secondary | ICD-10-CM | POA: Diagnosis not present

## 2018-07-15 DIAGNOSIS — J9621 Acute and chronic respiratory failure with hypoxia: Secondary | ICD-10-CM | POA: Diagnosis not present

## 2018-07-15 DIAGNOSIS — J45909 Unspecified asthma, uncomplicated: Secondary | ICD-10-CM | POA: Diagnosis not present

## 2018-07-15 DIAGNOSIS — J984 Other disorders of lung: Secondary | ICD-10-CM | POA: Diagnosis not present

## 2018-07-15 DIAGNOSIS — Z930 Tracheostomy status: Secondary | ICD-10-CM | POA: Diagnosis not present

## 2018-07-15 DIAGNOSIS — G8254 Quadriplegia, C5-C7 incomplete: Secondary | ICD-10-CM | POA: Diagnosis not present

## 2018-07-15 DIAGNOSIS — R0902 Hypoxemia: Secondary | ICD-10-CM | POA: Diagnosis not present

## 2018-07-15 DIAGNOSIS — J439 Emphysema, unspecified: Secondary | ICD-10-CM | POA: Diagnosis not present

## 2018-08-08 DIAGNOSIS — L89159 Pressure ulcer of sacral region, unspecified stage: Secondary | ICD-10-CM | POA: Diagnosis not present

## 2018-08-08 DIAGNOSIS — I509 Heart failure, unspecified: Secondary | ICD-10-CM | POA: Diagnosis not present

## 2018-08-08 DIAGNOSIS — L89154 Pressure ulcer of sacral region, stage 4: Secondary | ICD-10-CM | POA: Diagnosis not present

## 2018-08-08 DIAGNOSIS — N390 Urinary tract infection, site not specified: Secondary | ICD-10-CM | POA: Diagnosis not present

## 2018-08-08 DIAGNOSIS — J151 Pneumonia due to Pseudomonas: Secondary | ICD-10-CM | POA: Diagnosis not present

## 2018-08-09 DEATH — deceased

## 2019-04-02 NOTE — Telephone Encounter (Signed)
error 

## 2020-07-31 IMAGING — DX DG ABDOMEN 1V
1 series · 1 of 1 positions shown · non-contrast
Comparison: None.

CLINICAL DATA: Assessment of gastrostomy catheter for possible
leakage

EXAM:
ABDOMEN - 1 VIEW

[abdomen]
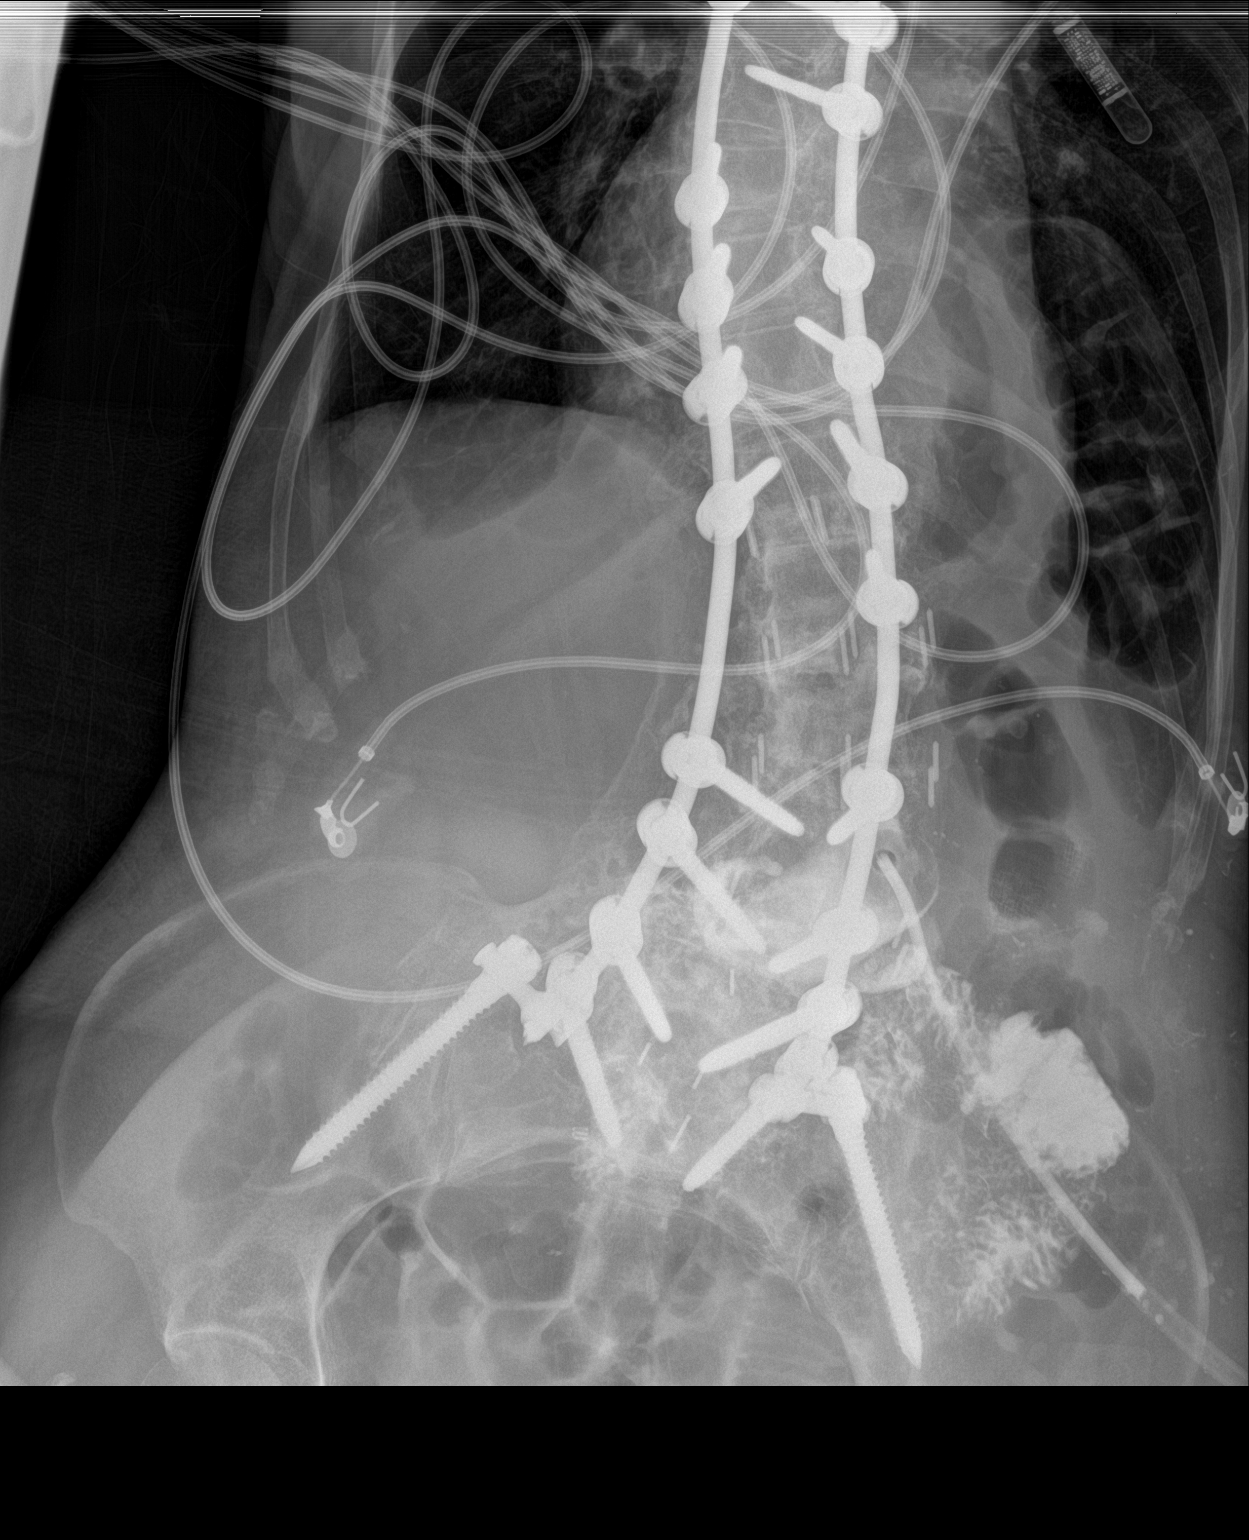

[1 of 1 positions shown; findings below may reference images not displayed]

FINDINGS: Contrast was administered via a percutaneously placed gastrostomy
catheter. Gastrostomy catheter is positioned in the stomach.
Contrast flows from the stomach into the bowel. No contrast
extravasation evident.

The bowel gas pattern is unremarkable. No bowel obstruction or free
air. There is elevation of the left hemidiaphragm. Lung bases are
clear.
IMPRESSION: Gastrostomy catheter positioned in the stomach. No contrast
extravasation. Contrast flows freely through the stomach into the
proximal bowel.

No bowel obstruction or free air evident. Visualized lung bases
clear.

## 2020-08-31 IMAGING — DX DG CHEST 1V PORT
2 series · 2 of 2 positions shown · non-contrast
Comparison: 12/06/2017

CLINICAL DATA: Pleural effusion

EXAM:
PORTABLE CHEST 1 VIEW

[chest ap (1 of 2)]
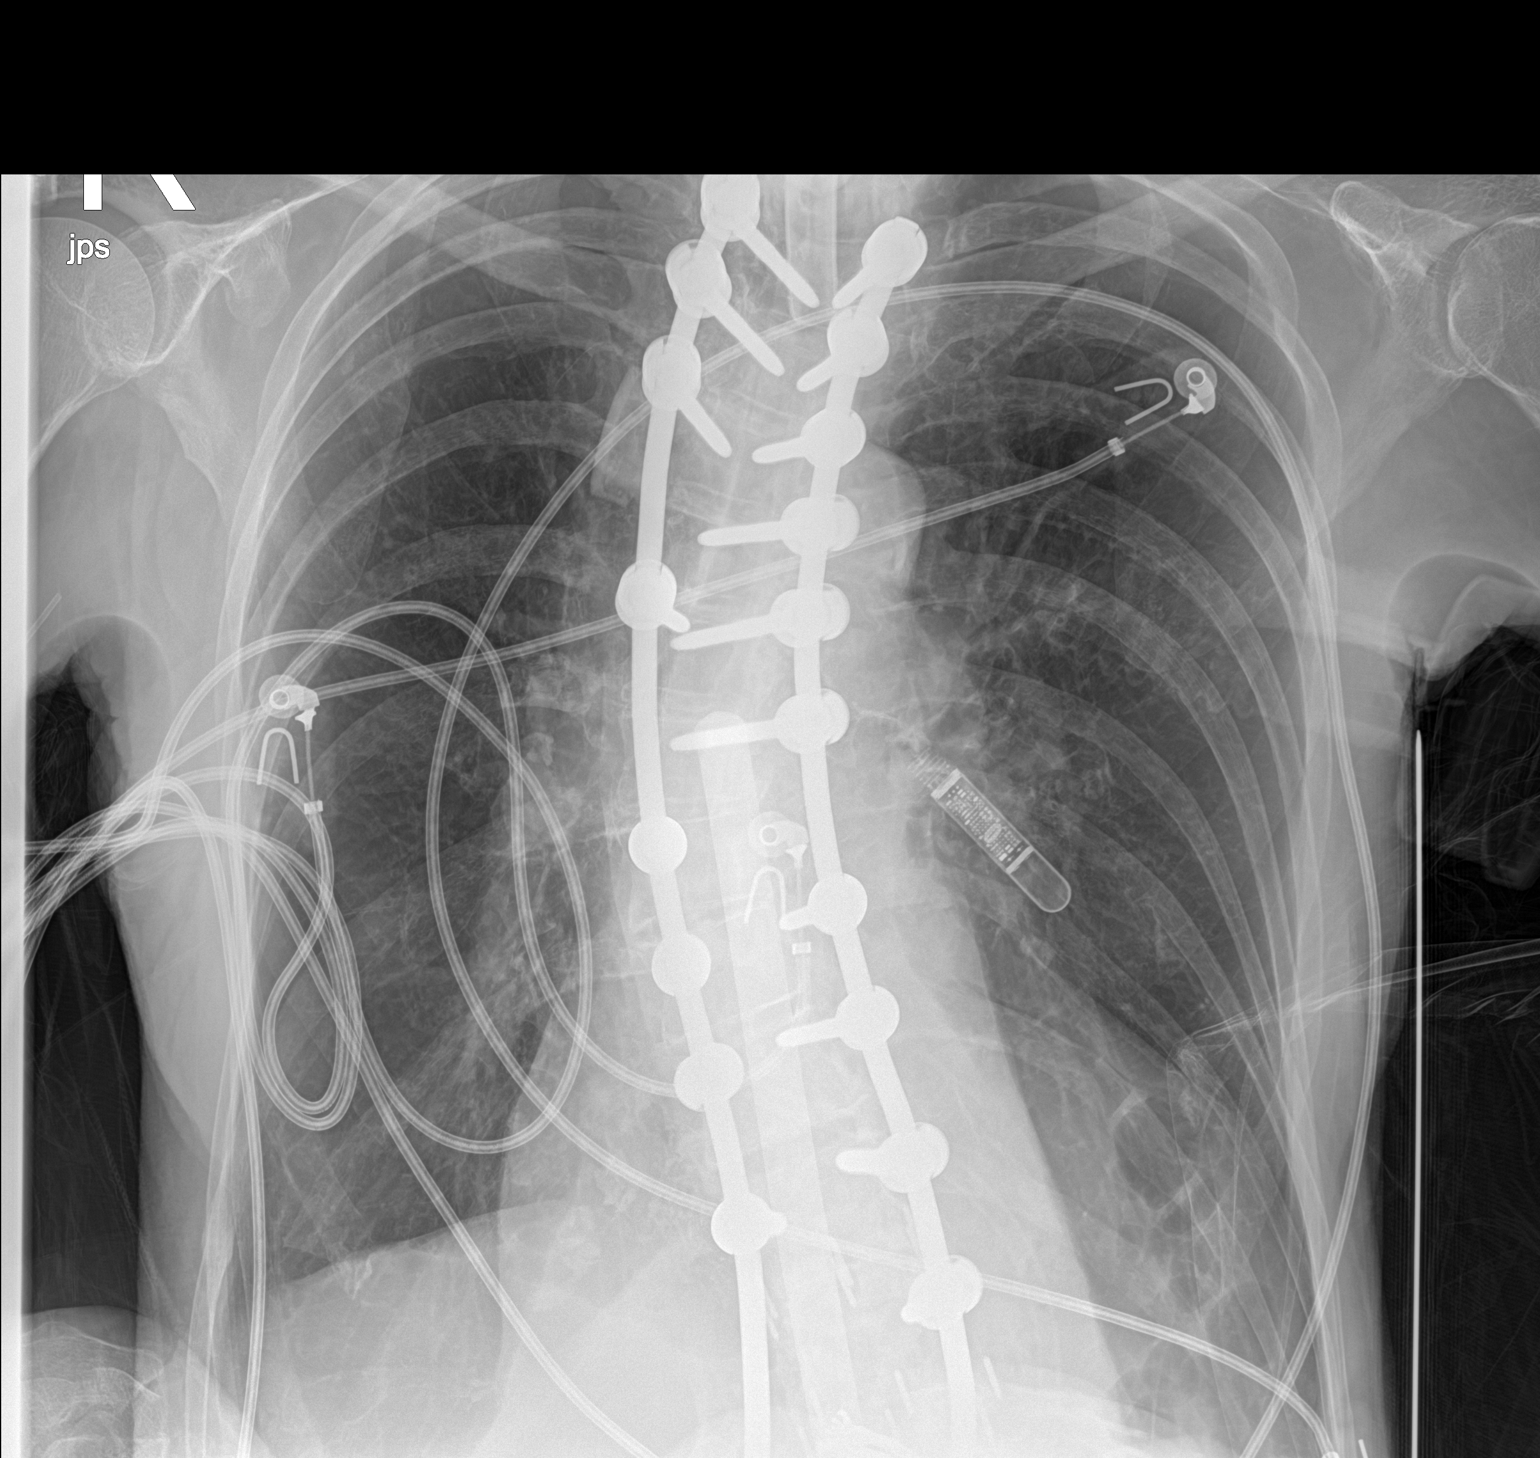

[chest ap (2 of 2)]
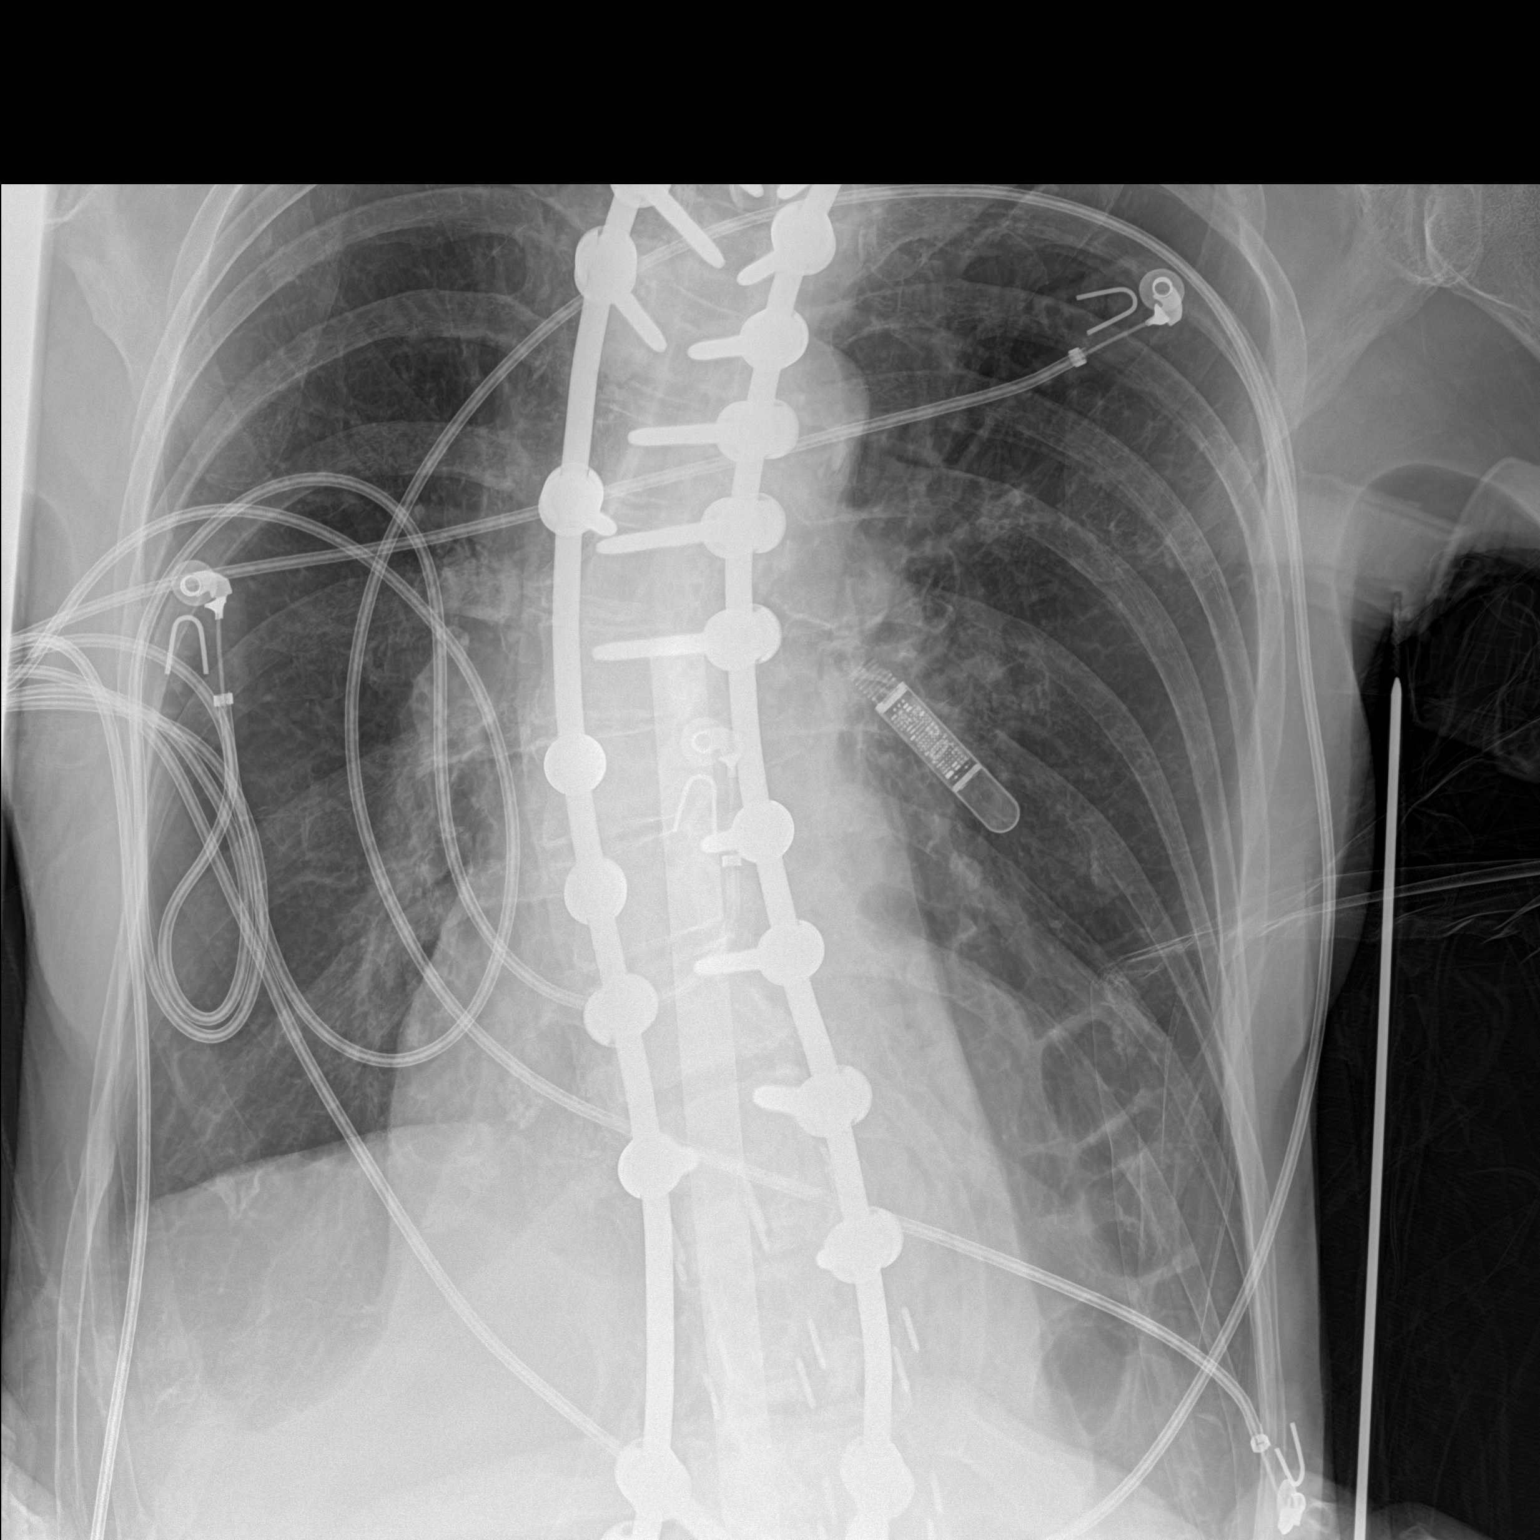

[2 of 2 positions shown; findings below may reference images not displayed]

FINDINGS: Heart is normal in size. There is persistent elevation of the left
hemidiaphragm which obscures the left lung base. Lungs are otherwise
clear. Harrington rods in the thoracic and lumbar spine are stable.
Tracheostomy tube is unchanged. Loop recorder projects over the left
hilum.
IMPRESSION: No active cardiopulmonary disease.

## 2020-09-16 IMAGING — DX DG CHEST 1V PORT
1 series · 1 of 1 positions shown · non-contrast
Comparison: 12/13/2017 and prior radiographs

CLINICAL DATA: Shortness of breath.

EXAM:
PORTABLE CHEST 1 VIEW

[chest]
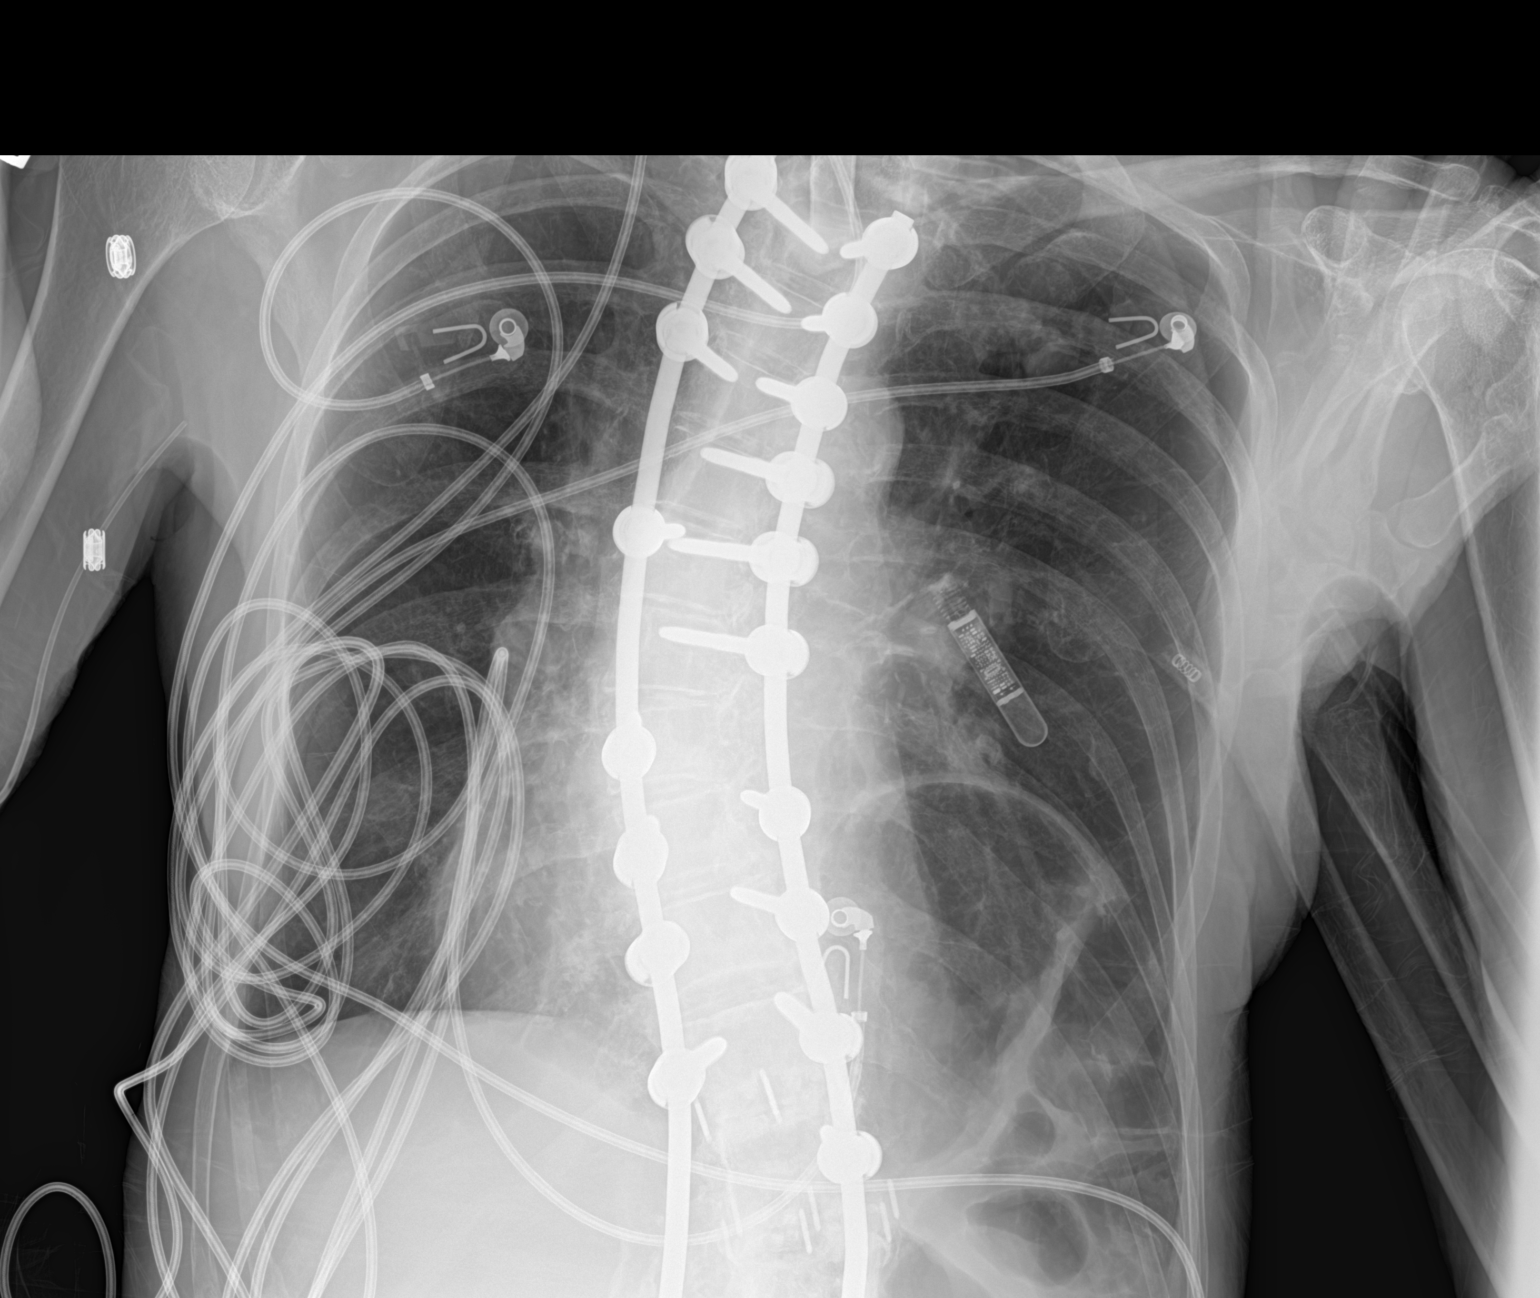

[1 of 1 positions shown; findings below may reference images not displayed]

FINDINGS: Cardiomediastinal silhouette is unchanged.

A tracheostomy tube, thoracolumbar surgical hardware and loop
recorder again noted.

Mild LEFT basilar atelectasis/scarring again noted.

There is no evidence of focal airspace disease, pulmonary edema,
suspicious pulmonary nodule/mass, pleural effusion, or pneumothorax.

No acute bony abnormalities are identified.
IMPRESSION: No acute abnormality.

## 2021-01-16 IMAGING — DX DG CHEST 1V PORT
1 series · 1 of 1 positions shown · non-contrast
Comparison: 04/28/2018 and earlier.

CLINICAL DATA: 72-year-old female with acute on chronic respiratory
failure. Intubated. C5-C6 quadriplegia.

EXAM:
PORTABLE CHEST 1 VIEW

[chest ap]
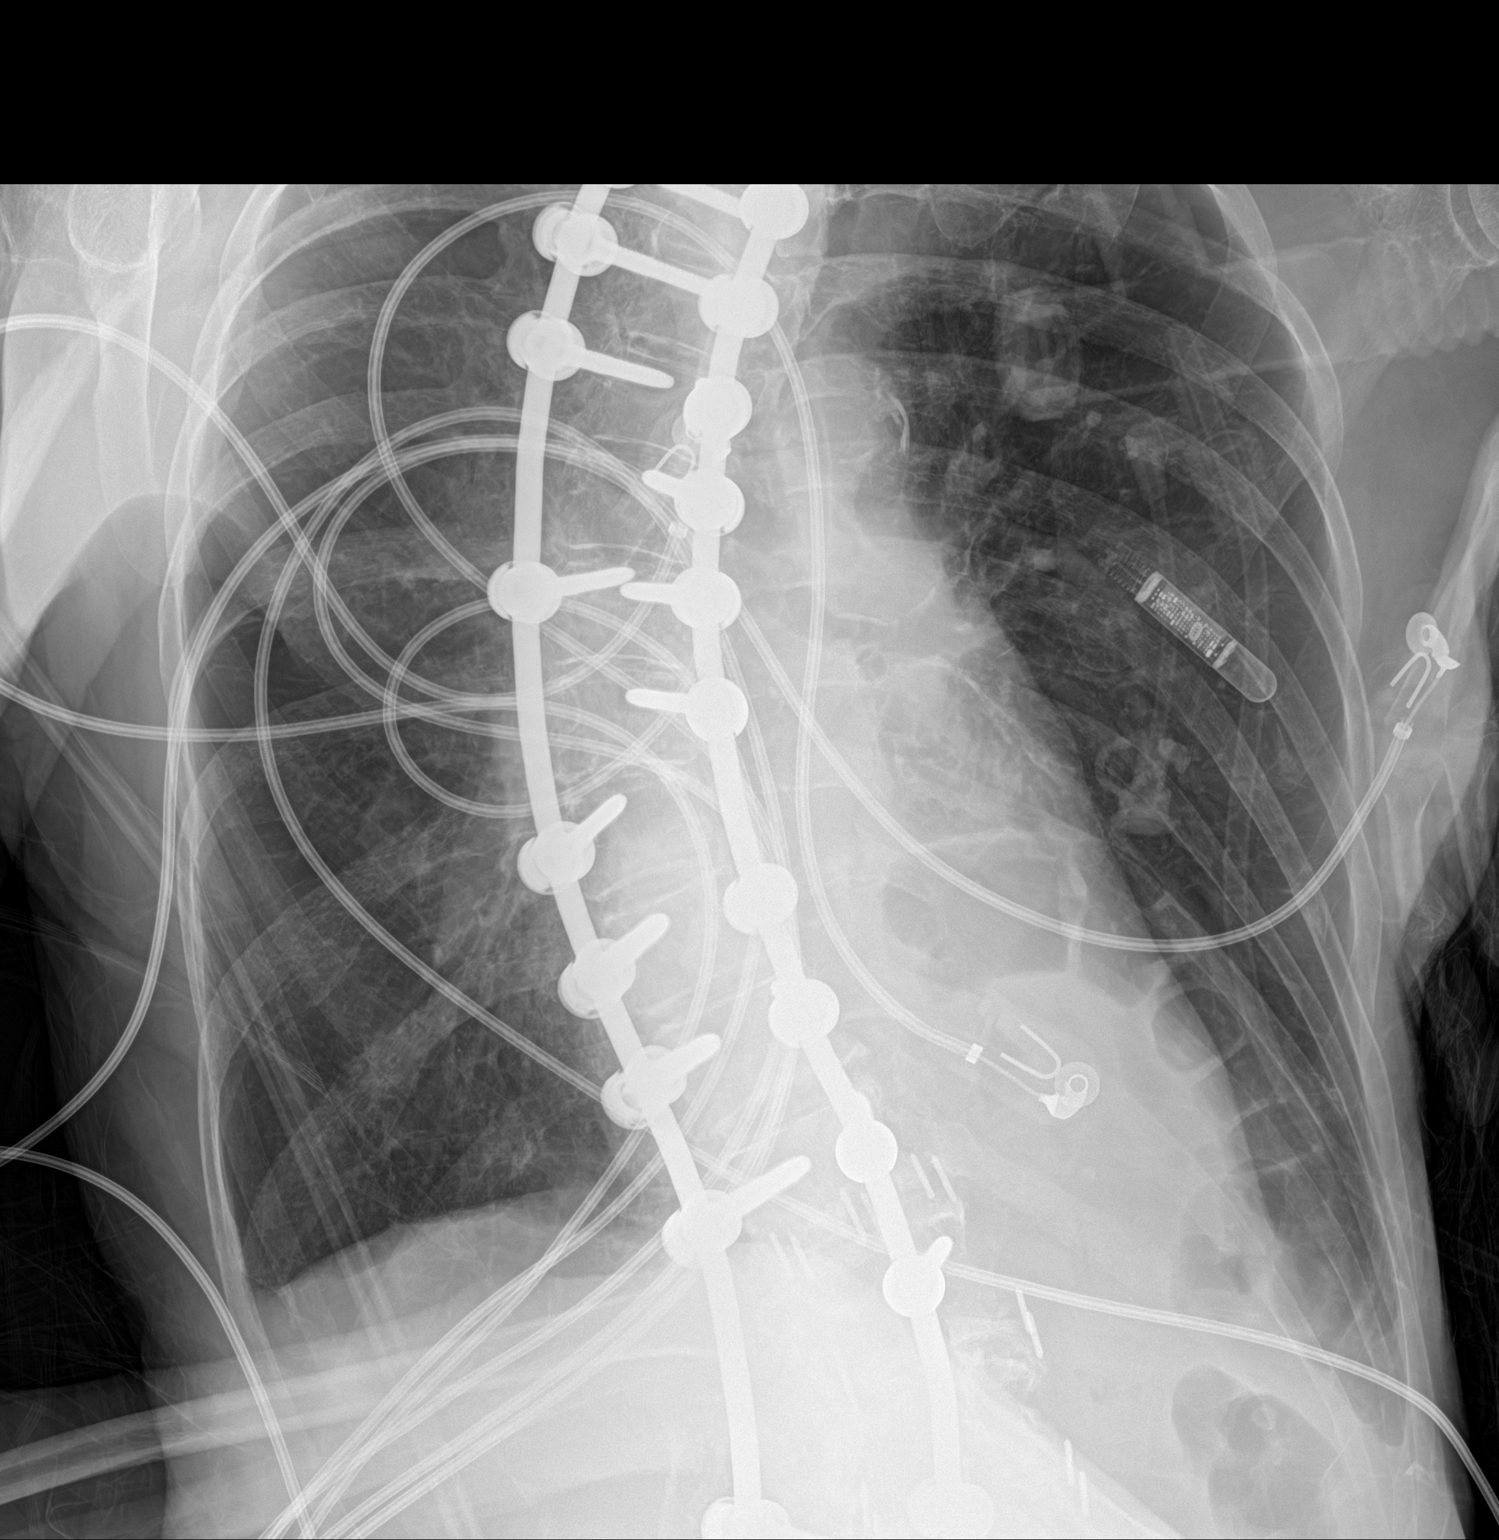

[1 of 1 positions shown; findings below may reference images not displayed]

FINDINGS: Portable AP semi upright view at 5225 hours. Endotracheal tube tip
is in good position, just below the clavicles. Extensive
thoracolumbar posterior spinal fusion hardware again noted. Stable
left chest cardiac event recorder.

Stable ventilation since yesterday. Large lung volumes. No
pneumothorax or pulmonary edema. Confluent retrocardiac opacity
which appears chronic and related to elevated hemidiaphragm or
diaphragmatic hernia containing some bowel (CT Abdomen and Pelvis
11/10/2017). No other confluent pulmonary opacity. No definite
pleural effusion.

Nonobstructed visible bowel gas pattern.
IMPRESSION: 1. Stable ET tube in good position.
2. No acute cardiopulmonary abnormality. Chronic large lung volumes
with retrocardiac elevated hemidiaphragm or bowel containing hernia.
